# Patient Record
Sex: Female | Born: 1990 | Hispanic: Yes | Marital: Single | Smoking: Never smoker
Health system: Southern US, Community
[De-identification: ages and names within clinical notes are randomized; demographics above are authoritative.]

## PROBLEM LIST (undated history)

## (undated) ENCOUNTER — Inpatient Hospital Stay (HOSPITAL_COMMUNITY): Payer: Self-pay

## (undated) DIAGNOSIS — R102 Pelvic and perineal pain: Secondary | ICD-10-CM

## (undated) DIAGNOSIS — N83201 Unspecified ovarian cyst, right side: Secondary | ICD-10-CM

## (undated) DIAGNOSIS — J039 Acute tonsillitis, unspecified: Secondary | ICD-10-CM

## (undated) DIAGNOSIS — R42 Dizziness and giddiness: Secondary | ICD-10-CM

## (undated) DIAGNOSIS — R55 Syncope and collapse: Secondary | ICD-10-CM

## (undated) DIAGNOSIS — R519 Headache, unspecified: Secondary | ICD-10-CM

## (undated) HISTORY — DX: Syncope and collapse: R55

## (undated) HISTORY — PX: OTHER SURGICAL HISTORY: SHX169

## (undated) HISTORY — DX: Pelvic and perineal pain: R10.2

## (undated) HISTORY — DX: Acute tonsillitis, unspecified: J03.90

## (undated) HISTORY — DX: Dizziness and giddiness: R42

## (undated) HISTORY — DX: Headache, unspecified: R51.9

## (undated) HISTORY — DX: Unspecified ovarian cyst, right side: N83.201

---
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
## 2013-04-24 ENCOUNTER — Emergency Department (HOSPITAL_COMMUNITY)
Admission: EM | Admit: 2013-04-24 | Discharge: 2013-04-25 | Disposition: A | Discharge status: Home / Self Care | Payer: Self-pay | Attending: Emergency Medicine | Admitting: Emergency Medicine

## 2013-04-24 ENCOUNTER — Encounter (HOSPITAL_COMMUNITY): Payer: Self-pay | Admitting: Adult Health

## 2013-04-24 DIAGNOSIS — Z23 Encounter for immunization: Secondary | ICD-10-CM | POA: Insufficient documentation

## 2013-04-24 DIAGNOSIS — L089 Local infection of the skin and subcutaneous tissue, unspecified: Secondary | ICD-10-CM

## 2013-04-24 DIAGNOSIS — T22219A Burn of second degree of unspecified forearm, initial encounter: Secondary | ICD-10-CM | POA: Insufficient documentation

## 2013-04-24 DIAGNOSIS — X19XXXA Contact with other heat and hot substances, initial encounter: Secondary | ICD-10-CM | POA: Insufficient documentation

## 2013-04-24 DIAGNOSIS — Y9289 Other specified places as the place of occurrence of the external cause: Secondary | ICD-10-CM | POA: Insufficient documentation

## 2013-04-24 DIAGNOSIS — Y9389 Activity, other specified: Secondary | ICD-10-CM | POA: Insufficient documentation

## 2013-04-24 DIAGNOSIS — Y990 Civilian activity done for income or pay: Secondary | ICD-10-CM | POA: Insufficient documentation

## 2013-04-24 DIAGNOSIS — IMO0002 Reserved for concepts with insufficient information to code with codable children: Secondary | ICD-10-CM

## 2013-04-24 MED ORDER — SULFAMETHOXAZOLE-TRIMETHOPRIM 800-160 MG PO TABS: 1.0000 | ORAL_TABLET | Freq: Two times a day (BID) | ORAL | Status: DC

## 2013-04-24 MED ORDER — SILVER SULFADIAZINE 1 % EX CREA
TOPICAL_CREAM | Freq: Once | CUTANEOUS | Status: AC
  Administered 2013-04-25: via TOPICAL
  Filled 2013-04-24: qty 85

## 2013-04-24 MED ORDER — TETANUS-DIPHTH-ACELL PERTUSSIS 5-2.5-18.5 LF-MCG/0.5 IM SUSP
0.5000 mL | Freq: Once | INTRAMUSCULAR | Status: AC
  Administered 2013-04-25: 0.5 mL via INTRAMUSCULAR
  Filled 2013-04-24: qty 0.5

## 2013-04-24 NOTE — ED Notes (Signed)
Presents with left arm burn from an oven on Tuesday of this week. Wound is scabbed and healing. Pt c/o itchiness and pain.

## 2013-04-24 NOTE — ED Provider Notes (Signed)
History     CSN: [id number]  Arrival date & time 04/24/13  4696   First MD Initiated Contact with Patient 04/24/13 2301      Chief Complaint  Patient presents with  . Burn    (Consider location/radiation/quality/duration/timing/severity/associated sxs/prior treatment) HPI Comments: 22 y/o female presents to the ED with a burn to her left forarm after hitting it on a hot oven at work 5 days ago. Patient states the burn is painful and itchy, worse when she touches the area to 8/10. She has been applying topical antibiotic cream with mild relief, however states it is beginning to feel hot and look red. Denies numbness or tingling. No fever or chills.   Patient is a 22 y.o. female presenting with burn. The history is provided by the patient.  Burn    History reviewed. No pertinent past medical history.  History reviewed. No pertinent past surgical history.  History reviewed. No pertinent family history.  History  Substance Use Topics  . Smoking status: Never Smoker   . Smokeless tobacco: Not on file  . Alcohol Use: No    OB History   Grav Para Term Preterm Abortions TAB SAB Ect Mult Living                  Review of Systems  Constitutional: Negative for fever and chills.  Skin: Positive for color change and wound.  All other systems reviewed and are negative.    Allergies  Review of patient's allergies indicates no known allergies.  Home Medications   Current Outpatient Rx  Name  Route  Sig  Dispense  Refill  . sulfamethoxazole-trimethoprim (BACTRIM DS,SEPTRA DS) 800-160 MG per tablet   Oral   Take 1 tablet by mouth 2 (two) times daily. One po bid x 7 days   14 tablet   0     BP 141/82  Pulse 82  Temp(Src) 98.3 F (36.8 C) (Oral)  Resp 16  SpO2 100%  Physical Exam  Nursing note and vitals reviewed. Constitutional: She is oriented to person, place, and time. She appears well-developed and well-nourished. No distress.  HENT:  Head: Normocephalic and  atraumatic.  Mouth/Throat: Oropharynx is clear and moist.  Eyes: Conjunctivae and EOM are normal.  Neck: Normal range of motion. Neck supple.  Cardiovascular: Normal rate, regular rhythm, normal heart sounds and intact distal pulses.   Capillary refill < 3 seconds.  Pulmonary/Chest: Effort normal and breath sounds normal. No respiratory distress.  Musculoskeletal: Normal range of motion.       Left elbow: Normal.       Left wrist: Normal.  Neurological: She is alert and oriented to person, place, and time. No sensory deficit.  Intact 2 point discrimination.  Skin: Skin is warm. Burn noted.  2 inch diameter 2nd degree burn to left forearm with surrounding edema and erythema. Warm to touch. Tender to palpation.  Psychiatric: She has a normal mood and affect. Her behavior is normal.    ED Course  Procedures (including critical care time)  Labs Reviewed - No data to display No results found.   1. Second degree burn   2. Skin infection       MDM  22 y/o female with second degree burn with associated skin infection. Silvadene applied. Tdap given. Rx bactrim. Wound care given. Advised ibuprofen 800mg  along with icing. Return precautions discussed. Patient states understanding of plan and is agreeable.        Trevor Mace, PA-C 04/24/13  2354 

## 2013-04-25 NOTE — ED Notes (Signed)
Pt discharged.Vital signs stable and GCS 15 

## 2013-04-25 NOTE — ED Provider Notes (Signed)
Medical screening examination/treatment/procedure(s) were performed by non-physician practitioner and as supervising physician I was immediately available for consultation/collaboration.  John-Adam Javae Braaten, M.D.   John-Adam Finola Rosal, MD 04/25/13 0750 

## 2013-11-22 ENCOUNTER — Inpatient Hospital Stay (HOSPITAL_COMMUNITY)
Admission: AD | Admit: 2013-11-22 | Discharge: 2013-11-22 | Disposition: A | Discharge status: Home / Self Care | Payer: Medicaid Other | Source: Ambulatory Visit | Attending: Family Medicine | Admitting: Family Medicine

## 2013-11-22 ENCOUNTER — Encounter (HOSPITAL_COMMUNITY): Payer: Self-pay | Admitting: Medical

## 2013-11-22 DIAGNOSIS — Z3201 Encounter for pregnancy test, result positive: Secondary | ICD-10-CM

## 2013-11-22 DIAGNOSIS — O99891 Other specified diseases and conditions complicating pregnancy: Secondary | ICD-10-CM | POA: Insufficient documentation

## 2013-11-22 DIAGNOSIS — R109 Unspecified abdominal pain: Secondary | ICD-10-CM | POA: Insufficient documentation

## 2013-11-22 DIAGNOSIS — I959 Hypotension, unspecified: Secondary | ICD-10-CM | POA: Insufficient documentation

## 2013-11-22 DIAGNOSIS — O210 Mild hyperemesis gravidarum: Secondary | ICD-10-CM | POA: Insufficient documentation

## 2013-11-22 LAB — URINALYSIS, ROUTINE W REFLEX MICROSCOPIC
Bilirubin Urine: NEGATIVE
Ketones, ur: NEGATIVE mg/dL
Leukocytes, UA: NEGATIVE
Nitrite: NEGATIVE
Specific Gravity, Urine: 1.025 (ref 1.005–1.030)
Urobilinogen, UA: 0.2 mg/dL (ref 0.0–1.0)
pH: 6 (ref 5.0–8.0)

## 2013-11-22 MED ORDER — ONDANSETRON 8 MG PO TBDP
8.0000 mg | ORAL_TABLET | Freq: Once | ORAL | Status: AC
  Administered 2013-11-22: 8 mg via ORAL
  Filled 2013-11-22: qty 1

## 2013-11-22 MED ORDER — PROMETHAZINE HCL 25 MG PO TABS: 25.0000 mg | ORAL_TABLET | Freq: Four times a day (QID) | ORAL | Status: DC | PRN

## 2013-11-22 MED ORDER — ONDANSETRON 8 MG PO TBDP: 8.0000 mg | ORAL_TABLET | Freq: Three times a day (TID) | ORAL | Status: DC | PRN

## 2013-11-22 NOTE — MAU Note (Signed)
Patient states she had a positive pregnancy test at [facility] last week. States she has had pain 3 times in the past 2-3 weeks. Denies bleeding or vaginal discharge. Has had nausea and vomiting off and on for about 2-3 weeks.

## 2013-11-22 NOTE — MAU Provider Note (Signed)
History     CSN: [id number]  Arrival date and time: 11/22/13 1256   First Provider Initiated Contact with Patient 11/22/13 1353      Chief Complaint  Patient presents with  . Possible Pregnancy  . Abdominal Pain   HPI [name] is a 22 y.o. G1P0 at [redacted]w[redacted]d who presents to MAU today with multiple complaints. The patient states that she has had confirmation of pregnancy at the [facility] prior to today. The patient states that she plans to go to Dr. Gaynell Face for prenatal care. She states that she has had one episode of lower abdominal pain about 1 week ago. She has some right flank pain earlier today during ambulation that resolved after less than one minute. She patient not having any pain now. She denies vaginal bleeding or discharge. She states that she is also having occasional positional dizziness. She noted one episode of dizziness when she stood up the other day. The patient has also had occasional headaches, but no pain now. She denies fever or UTI symptoms.   OB History   Grav Para Term Preterm Abortions TAB SAB Ect Mult Living   1               No past medical history on file.  No past surgical history on file.  No family history on file.  History  Substance Use Topics  . Smoking status: Never Smoker   . Smokeless tobacco: Not on file  . Alcohol Use: No    Allergies: No Known Allergies  Prescriptions prior to admission  Medication Sig Dispense Refill  . sulfamethoxazole-trimethoprim (BACTRIM DS,SEPTRA DS) 800-160 MG per tablet Take 1 tablet by mouth 2 (two) times daily. One po bid x 7 days  14 tablet  0    Review of Systems  Constitutional: Negative for fever and malaise/fatigue.  Gastrointestinal: Positive for nausea and vomiting. Negative for abdominal pain, diarrhea and constipation.  Genitourinary: Negative for dysuria, urgency and frequency.       Neg - vaginal bleeding, discharge  Neurological: Positive for dizziness. Negative for loss  of consciousness.   Physical Exam   Blood pressure 137/71, pulse 111, temperature 98.6 F (37 C), temperature source Oral, resp. rate 16, height 5' 0.5" (1.537 m), weight 185 lb 9.6 oz (84.188 kg), last menstrual period 10/12/2013, SpO2 100.00%.  Physical Exam  Constitutional: She is oriented to person, place, and time. She appears well-developed and well-nourished. No distress.  HENT:  Head: Normocephalic and atraumatic.  Cardiovascular: Normal rate, regular rhythm and normal heart sounds.   Respiratory: Effort normal and breath sounds normal. No respiratory distress.  GI: Soft. Bowel sounds are normal. She exhibits no distension and no mass. There is no tenderness. There is no rebound and no guarding.  Neurological: She is alert and oriented to person, place, and time.  Skin: Skin is warm and dry. No erythema.  Psychiatric: She has a normal mood and affect.   Results for orders placed during the hospital encounter of 11/22/13 (from the past 24 hour(s))  URINALYSIS, ROUTINE W REFLEX MICROSCOPIC     Status: None   Collection Time    11/22/13  1:33 PM      Result Value Range   Color, Urine YELLOW  YELLOW   APPearance CLEAR  CLEAR   Specific Gravity, Urine 1.025  1.005 - 1.030   pH 6.0  5.0 - 8.0   Glucose, UA NEGATIVE  NEGATIVE mg/dL   Hgb urine dipstick NEGATIVE  NEGATIVE   Bilirubin Urine NEGATIVE  NEGATIVE   Ketones, ur NEGATIVE  NEGATIVE mg/dL   Protein, ur NEGATIVE  NEGATIVE mg/dL   Urobilinogen, UA 0.2  0.0 - 1.0 mg/dL   Nitrite NEGATIVE  NEGATIVE   Leukocytes, UA NEGATIVE  NEGATIVE  POCT PREGNANCY, URINE     Status: Abnormal   Collection Time    11/22/13  1:35 PM      Result Value Range   Preg Test, Ur POSITIVE (*) NEGATIVE    MAU Course  Procedures None  MDM +UPT UA today Spent ~ 20 minutes on patient education Discussed reasons for positional dizziness in pregnancy Discussed nausea and vomiting in pregnancy  Assessment and Plan  A: Positive pregnancy  test Positional hypotension in pregnancy Nausea and vomiting in pregnancy prior to [redacted] weeks gestation  P: Discharge home Patient advised to take Tylenol PRN for pain Rx for Phenergan and Zofran given/sent to patient's pharmacy First trimester warning signs reviewed Patient advised to make an appointment with Dr. Gaynell Face to start prenatal care Patient may return to MAU as needed or if her condition were to change or worsen  Freddi Starr, PA-C  11/22/2013, 1:54 PM

## 2013-11-22 NOTE — MAU Provider Note (Signed)
Chart reviewed and agree with management and plan.  

## 2013-11-22 NOTE — MAU Note (Signed)
Patient is in with c/o intermittent left lateral pain and nausea. She states that she called dr Gaynell Face and she was advised to come to MAU. She denies vaginal bleeding or abnormal discharge. lmp 10/15

## 2013-11-23 ENCOUNTER — Inpatient Hospital Stay (HOSPITAL_COMMUNITY): Payer: Medicaid Other

## 2013-11-23 ENCOUNTER — Inpatient Hospital Stay (HOSPITAL_COMMUNITY)
Admission: AD | Admit: 2013-11-23 | Discharge: 2013-11-23 | Disposition: A | Discharge status: Home / Self Care | Payer: Medicaid Other | Source: Ambulatory Visit | Attending: Obstetrics & Gynecology | Admitting: Obstetrics & Gynecology

## 2013-11-23 ENCOUNTER — Encounter (HOSPITAL_COMMUNITY): Payer: Self-pay | Admitting: *Deleted

## 2013-11-23 DIAGNOSIS — B9689 Other specified bacterial agents as the cause of diseases classified elsewhere: Secondary | ICD-10-CM | POA: Insufficient documentation

## 2013-11-23 DIAGNOSIS — N760 Acute vaginitis: Secondary | ICD-10-CM | POA: Insufficient documentation

## 2013-11-23 DIAGNOSIS — O208 Other hemorrhage in early pregnancy: Secondary | ICD-10-CM | POA: Insufficient documentation

## 2013-11-23 DIAGNOSIS — O209 Hemorrhage in early pregnancy, unspecified: Secondary | ICD-10-CM

## 2013-11-23 DIAGNOSIS — A499 Bacterial infection, unspecified: Secondary | ICD-10-CM | POA: Insufficient documentation

## 2013-11-23 DIAGNOSIS — R109 Unspecified abdominal pain: Secondary | ICD-10-CM | POA: Insufficient documentation

## 2013-11-23 DIAGNOSIS — O2390 Unspecified genitourinary tract infection in pregnancy, unspecified trimester: Secondary | ICD-10-CM | POA: Insufficient documentation

## 2013-11-23 DIAGNOSIS — O4690 Antepartum hemorrhage, unspecified, unspecified trimester: Secondary | ICD-10-CM

## 2013-11-23 LAB — CBC
HCT: 36.1 % (ref 36.0–46.0)
Hemoglobin: 12.8 g/dL (ref 12.0–15.0)
MCHC: 35.5 g/dL (ref 30.0–36.0)
RBC: 3.97 MIL/uL (ref 3.87–5.11)

## 2013-11-23 LAB — HCG, QUANTITATIVE, PREGNANCY: hCG, Beta Chain, Quant, S: 11406 m[IU]/mL — ABNORMAL HIGH (ref <5)

## 2013-11-23 LAB — WET PREP, GENITAL

## 2013-11-23 LAB — ABO/RH: ABO/RH(D): O POS

## 2013-11-23 MED ORDER — METRONIDAZOLE 500 MG PO TABS: 500.0000 mg | ORAL_TABLET | Freq: Two times a day (BID) | ORAL | Status: DC

## 2013-11-23 NOTE — MAU Note (Signed)
Was seen in MAU yesterday for pain. States today she has no pain but started bleeding around 1330. At first was pink when she wiped. States now it is red and comes out some into toilet, but not onto panties.

## 2013-11-23 NOTE — MAU Provider Note (Signed)
History     CSN: [id number]  Arrival date and time: 11/23/13 1412   First Provider Initiated Contact with Patient 11/23/13 1535      Chief Complaint  Patient presents with  . Vaginal Bleeding   HPI  Pt is a 22 yo G1P0 at [redacted]w[redacted]d wks IUP here with report of bleeding that started around 1330. Pt reports it initially started light pink when she wiped, states now it is red and comes out some into toilet, but not onto panties.  Also reports having some cramping.     History reviewed. No pertinent past medical history.  History reviewed. No pertinent past surgical history.  History reviewed. No pertinent family history.  History  Substance Use Topics  . Smoking status: Never Smoker   . Smokeless tobacco: Not on file  . Alcohol Use: No    Allergies: No Known Allergies  Prescriptions prior to admission  Medication Sig Dispense Refill  . ondansetron (ZOFRAN-ODT) 8 MG disintegrating tablet Take 1 tablet (8 mg total) by mouth every 8 (eight) hours as needed for nausea or vomiting.  20 tablet  0  . Prenatal Vit-Fe Fumarate-FA (PRENATAL MULTIVITAMIN) TABS tablet Take 1 tablet by mouth daily at 12 noon.      . promethazine (PHENERGAN) 25 MG tablet Take 1 tablet (25 mg total) by mouth every 6 (six) hours as needed for nausea or vomiting.  30 tablet  0    Review of Systems  Gastrointestinal: Positive for abdominal pain (cramping).  Genitourinary:       Vaginal bleeding  All other systems reviewed and are negative.   Physical Exam   Blood pressure 129/70, pulse 101, temperature 98.2 F (36.8 C), temperature source Oral, resp. rate 16, height 5' 0.5" (1.537 m), weight 84.46 kg (186 lb 3.2 oz), last menstrual period 10/12/2013.  Physical Exam  Constitutional: She is oriented to person, place, and time. She appears well-developed and well-nourished. No distress.  HENT:  Head: Normocephalic.  Neck: Normal range of motion. Neck supple.  Cardiovascular: Normal rate, regular rhythm  and normal heart sounds.   Respiratory: Effort normal and breath sounds normal. No respiratory distress.  GI: Soft. There is no tenderness.  Genitourinary: Uterus is enlarged. Right adnexum displays no mass, no tenderness and no fullness. Left adnexum displays no mass, no tenderness and no fullness. There is bleeding (moderate, neg clots) around the vagina.  Cervix visually closed  Musculoskeletal: Normal range of motion.  Neurological: She is alert and oriented to person, place, and time.  Skin: Skin is warm and dry.    MAU Course  Procedures  Results for orders placed during the hospital encounter of 11/23/13 (from the past 24 hour(s))  WET PREP, GENITAL     Status: Abnormal   Collection Time    11/23/13  3:34 PM      Result Value Range   Yeast Wet Prep HPF POC NONE SEEN  NONE SEEN   Trich, Wet Prep NONE SEEN  NONE SEEN   Clue Cells Wet Prep HPF POC MODERATE (*) NONE SEEN   WBC, Wet Prep HPF POC FEW (*) NONE SEEN  CBC     Status: None   Collection Time    11/23/13  3:48 PM      Result Value Range   WBC 10.2  4.0 - 10.5 K/uL   RBC 3.97  3.87 - 5.11 MIL/uL   Hemoglobin 12.8  12.0 - 15.0 g/dL   HCT 28.4  13.2 - 44.0 %  MCV 90.9  78.0 - 100.0 fL   MCH 32.2  26.0 - 34.0 pg   MCHC 35.5  30.0 - 36.0 g/dL   RDW 16.1  09.6 - 04.5 %   Platelets 242  150 - 400 K/uL  HCG, QUANTITATIVE, PREGNANCY     Status: Abnormal   Collection Time    11/23/13  3:48 PM      Result Value Range   hCG, Beta Chain, Quant, S 11406 (*) <5 mIU/mL  ABO/RH     Status: None   Collection Time    11/23/13  3:48 PM      Result Value Range   ABO/RH(D) O POS       Ultrasound: FINDINGS:  Intrauterine gestational sac: Visualized/normal in shape.  Yolk sac: Present.  Embryo: Present.  Cardiac Activity: Present.  Heart Rate: 137 bpm  CRL: 2.8 mm 6 w 0 d [place] EDC: 07/19/2014  Maternal uterus/adnexae: Moderate subchorionic hemorrhage. Right  ovary is not visualized. A corpus luteum cyst is seen in the left   ovary. No free fluid.  IMPRESSION:  1. Single living intrauterine pregnancy with a gestational age of [redacted]  weeks 0 days and estimated date of confinement of 07/19/2014.  2. Moderate subchorionic hemorrhage.   Assessment and Plan  22 yo G1P0 at [redacted]w[redacted]d weeks IUP Subchorionic Hemorrhage Bacterial Vaginosis  Plan: Discharge home Bleeding precautions  RX Flagyl 500 mg BID x 7 days   [facility] 11/23/2013, 3:36 PM

## 2013-11-24 LAB — GC/CHLAMYDIA PROBE AMP: GC Probe RNA: NEGATIVE

## 2013-11-30 NOTE — MAU Provider Note (Signed)
Attestation of Attending Supervision of Advanced Practitioner (CNM/NP): Evaluation and management procedures were performed by the Advanced Practitioner under my supervision and collaboration.  I have reviewed the Advanced Practitioner's note and chart, and I agree with the management and plan.  HARRAWAY-SMITH, Ajai Harville 2:38 PM     

## 2013-12-01 ENCOUNTER — Inpatient Hospital Stay (HOSPITAL_COMMUNITY)
Admission: AD | Admit: 2013-12-01 | Discharge: 2013-12-02 | Disposition: A | Discharge status: Home / Self Care | Payer: Medicaid Other | Source: Ambulatory Visit | Attending: Obstetrics and Gynecology | Admitting: Obstetrics and Gynecology

## 2013-12-01 DIAGNOSIS — R109 Unspecified abdominal pain: Secondary | ICD-10-CM | POA: Insufficient documentation

## 2013-12-01 DIAGNOSIS — O418X10 Other specified disorders of amniotic fluid and membranes, first trimester, not applicable or unspecified: Secondary | ICD-10-CM

## 2013-12-01 DIAGNOSIS — O208 Other hemorrhage in early pregnancy: Secondary | ICD-10-CM | POA: Insufficient documentation

## 2013-12-02 ENCOUNTER — Encounter (HOSPITAL_COMMUNITY): Payer: Self-pay | Admitting: *Deleted

## 2013-12-02 ENCOUNTER — Inpatient Hospital Stay (HOSPITAL_COMMUNITY): Payer: Medicaid Other

## 2013-12-02 DIAGNOSIS — O468X9 Other antepartum hemorrhage, unspecified trimester: Secondary | ICD-10-CM

## 2013-12-02 LAB — URINALYSIS, ROUTINE W REFLEX MICROSCOPIC
Leukocytes, UA: NEGATIVE
Nitrite: NEGATIVE
Protein, ur: NEGATIVE mg/dL
Specific Gravity, Urine: 1.015 (ref 1.005–1.030)
Urobilinogen, UA: 0.2 mg/dL (ref 0.0–1.0)

## 2013-12-02 LAB — POCT PREGNANCY, URINE: Preg Test, Ur: POSITIVE — AB

## 2013-12-02 LAB — URINE MICROSCOPIC-ADD ON

## 2013-12-02 NOTE — MAU Note (Signed)
Pt states she started having pain 12/01/13 at 2200 that lasted for about 1-22min. Pt states she does not have the pain any more

## 2013-12-02 NOTE — MAU Provider Note (Signed)
Chief Complaint: No chief complaint on file.   First Provider Initiated Contact with Patient 12/02/13 0215      SUBJECTIVE HPI: Tina [name] is a 22 y.o. G1P0 at [redacted]w[redacted]d by LMP who presents with dark red and pink vaginal bleeding and upper abd pain x 15 minutes that resolved spontaneously. Live IUP confirmed 11/23/2013. Known SCH.   Past Medical History  Diagnosis Date  . Medical history non-contributory    OB History  Gravida Para Term Preterm AB SAB TAB Ectopic Multiple Living  1         0    # Outcome Date GA Lbr Len/2nd Weight Sex Delivery Anes PTL Lv  1 CUR              Past Surgical History  Procedure Laterality Date  . No past surgeries     History   Social History  . Marital Status: Single    Spouse Name: N/A    Number of Children: N/A  . Years of Education: N/A   Occupational History  . Not on file.   Social History Main Topics  . Smoking status: Never Smoker   . Smokeless tobacco: Not on file  . Alcohol Use: No  . Drug Use: No  . Sexual Activity: No   Other Topics Concern  . Not on file   Social History Narrative  . No narrative on file   No current facility-administered medications on file prior to encounter.   Current Outpatient Prescriptions on File Prior to Encounter  Medication Sig Dispense Refill  . metroNIDAZOLE (FLAGYL) 500 MG tablet Take 1 tablet (500 mg total) by mouth 2 (two) times daily.  14 tablet  0  . ondansetron (ZOFRAN-ODT) 8 MG disintegrating tablet Take 1 tablet (8 mg total) by mouth every 8 (eight) hours as needed for nausea or vomiting.  20 tablet  0  . Prenatal Vit-Fe Fumarate-FA (PRENATAL MULTIVITAMIN) TABS tablet Take 1 tablet by mouth daily at 12 noon.      . promethazine (PHENERGAN) 25 MG tablet Take 1 tablet (25 mg total) by mouth every 6 (six) hours as needed for nausea or vomiting.  30 tablet  0   No Known Allergies  ROS: Pertinent items in HPI  OBJECTIVE Blood pressure 121/77, pulse 87, temperature 98 F (36.7 C),  temperature source Oral, resp. rate 16, height 5\' 1"  (1.549 m), weight 87.454 kg (192 lb 12.8 oz), last menstrual period 10/12/2013. GENERAL: Well-developed, well-nourished female in no acute distress.  HEENT: Normocephalic HEART: normal rate RESP: normal effort ABDOMEN: Soft, non-tender EXTREMITIES: Nontender, no edema NEURO: Alert and oriented SPECULUM EXAM: Deferred due to recent exam. Scant blood on pad.   LAB RESULTS Results for orders placed during the hospital encounter of 12/01/13 (from the past 24 hour(s))  URINALYSIS, ROUTINE W REFLEX MICROSCOPIC     Status: Abnormal   Collection Time    12/01/13 11:56 PM      Result Value Range   Color, Urine YELLOW  YELLOW   APPearance CLEAR  CLEAR   Specific Gravity, Urine 1.015  1.005 - 1.030   pH 6.0  5.0 - 8.0   Glucose, UA NEGATIVE  NEGATIVE mg/dL   Hgb urine dipstick LARGE (*) NEGATIVE   Bilirubin Urine NEGATIVE  NEGATIVE   Ketones, ur NEGATIVE  NEGATIVE mg/dL   Protein, ur NEGATIVE  NEGATIVE mg/dL   Urobilinogen, UA 0.2  0.0 - 1.0 mg/dL   Nitrite NEGATIVE  NEGATIVE   Leukocytes, UA NEGATIVE  NEGATIVE  URINE MICROSCOPIC-ADD ON     Status: None   Collection Time    12/01/13 11:56 PM      Result Value Range   Squamous Epithelial / LPF RARE  RARE   WBC, UA 0-2  <3 WBC/hpf   RBC / HPF 0-2  <3 RBC/hpf   Bacteria, UA RARE  RARE  POCT PREGNANCY, URINE     Status: Abnormal   Collection Time    12/02/13 12:15 AM      Result Value Range   Preg Test, Ur POSITIVE (*) NEGATIVE    IMAGING US Ob Transvaginal  12/02/2013   CLINICAL DATA:  Bleeding  EXAM: TRANSVAGINAL OB ULTRASOUND  TECHNIQUE: Transvaginal ultrasound was performed for complete evaluation of the gestation as well as the maternal uterus, adnexal regions, and pelvic cul-de-sac.  COMPARISON:  11/23/2013  FINDINGS: Intrauterine gestational sac: Visualized/normal in shape.  Yolk sac:  Identified  Embryo:  Identified  Cardiac Activity: Identified  Heart Rate: 154 bpm  CRL:   11   mm   7 w 2d                  [place] EDC: 07/19/2014  Maternal uterus/adnexae: Right ovary not visualized. Normal sonographic appearance to the left ovary. No free fluid.  Large subchorionic hemorrhage, increased in size in the interval, now measuring 3.1 x 2.6 x 4.0 cm.  IMPRESSION: Single intrauterine gestation with cardiac activity documented. Estimated age of 7 weeks 2 days by crown-rump length.  Large subchorionic hemorrhage, increased in size in the interval.   Electronically Signed   By: Jearld Lesch M.D.   On: 12/02/2013 03:13   MAU COURSE  ASSESSMENT 1. Subchorionic hemorrhage in first trimester    PLAN Discharge home in stable condition. Pelvic rest x 1 week.  Bleeding precautions.     Follow-up Information   Follow up with [facility] On 12/13/2013. (as scheduled or as needed if symptoms worsen)    Contact information:   [address] [place] [address] [phone]      Follow up with [facility] [place] [facility]ERN[facility] ADMISS[facility]. (As needed if symptoms worsen)    Contact information:   [address] [id number] [place] [address] [phone]       Medication List         metroNIDAZOLE 500 MG tablet  Commonly known as:  FLAGYL  Take 1 tablet (500 mg total) by mouth 2 (two) times daily.     ondansetron 8 MG disintegrating tablet  Commonly known as:  ZOFRAN-ODT  Take 1 tablet (8 mg total) by mouth every 8 (eight) hours as needed for nausea or vomiting.     prenatal multivitamin Tabs tablet  Take 1 tablet by mouth daily at 12 noon.     promethazine 25 MG tablet  Commonly known as:  PHENERGAN  Take 1 tablet (25 mg total) by mouth every 6 (six) hours as needed for nausea or vomiting.       [place], [place] 12/02/2013  4:28 AM   '

## 2013-12-02 NOTE — MAU Note (Signed)
Pt G1 at 7.2wks with RUQ pain and vag bleeding.

## 2013-12-02 NOTE — MAU Provider Note (Signed)
Attestation of Attending Supervision of Advanced Practitioner (CNM/NP): Evaluation and management procedures were performed by the Advanced Practitioner under my supervision and collaboration.  I have reviewed the Advanced Practitioner's note and chart, and I agree with the management and plan.  Drako Maese 12/02/2013 8:12 AM   

## 2013-12-06 ENCOUNTER — Ambulatory Visit (INDEPENDENT_AMBULATORY_CARE_PROVIDER_SITE_OTHER): Payer: Medicaid Other | Admitting: Obstetrics

## 2013-12-06 VITALS — BP 119/80 | HR 79 | Temp 97.3°F | Ht 61.0 in | Wt 191.0 lb

## 2013-12-06 DIAGNOSIS — Z3201 Encounter for pregnancy test, result positive: Secondary | ICD-10-CM

## 2013-12-06 NOTE — Progress Notes (Signed)
Subjective:     Tina [name] is a 22 y.o. female here for a new patient exam.  Current complaints: pt in office today to f/u from visit to MAU. Pt has subchorionic hemorrhage.   Pt has had light brown bleeding since seen in MAU.  Personal health questionnaire reviewed: yes. Pt not seen by physician today. Pt to schedule NOB appt.  Gynecologic History Patient's last menstrual period was 10/12/2013. Contraception: none Last Pap: unsure. Results were: normal   Obstetric History OB History  Gravida Para Term Preterm AB SAB TAB Ectopic Multiple Living  1         0    # Outcome Date GA Lbr Len/2nd Weight Sex Delivery Anes PTL Lv  1 CUR                The following portions of the patient's history were reviewed and updated as appropriate: allergies, current medications, past family history, past medical history, past social history, past surgical history and problem list.  Review of Systems Pertinent items are noted in HPI.    Objective:    No exam performed today, patient left without being seen.    Assessment:    Pregnancy.  Patient not seen by physician.  Rescheduled.   Plan:    Patient to schedule NOB visit.

## 2013-12-09 ENCOUNTER — Encounter: Payer: Self-pay | Admitting: Obstetrics

## 2013-12-13 ENCOUNTER — Encounter: Payer: Self-pay | Admitting: Advanced Practice Midwife

## 2013-12-18 ENCOUNTER — Encounter (HOSPITAL_COMMUNITY): Payer: Self-pay | Admitting: *Deleted

## 2013-12-18 ENCOUNTER — Inpatient Hospital Stay (HOSPITAL_COMMUNITY)
Admission: AD | Admit: 2013-12-18 | Discharge: 2013-12-18 | Disposition: A | Discharge status: Home / Self Care | Payer: Medicaid Other | Source: Ambulatory Visit | Attending: Obstetrics | Admitting: Obstetrics

## 2013-12-18 DIAGNOSIS — O368990 Maternal care for other specified fetal problems, unspecified trimester, not applicable or unspecified: Secondary | ICD-10-CM | POA: Insufficient documentation

## 2013-12-18 DIAGNOSIS — O418X10 Other specified disorders of amniotic fluid and membranes, first trimester, not applicable or unspecified: Secondary | ICD-10-CM

## 2013-12-18 LAB — URINALYSIS, ROUTINE W REFLEX MICROSCOPIC
Bilirubin Urine: NEGATIVE
Glucose, UA: NEGATIVE mg/dL
Protein, ur: NEGATIVE mg/dL

## 2013-12-18 LAB — URINE MICROSCOPIC-ADD ON

## 2013-12-18 LAB — CBC
HCT: 36.1 % (ref 36.0–46.0)
MCH: 32 pg (ref 26.0–34.0)
MCHC: 34.6 g/dL (ref 30.0–36.0)
MCV: 92.3 fL (ref 78.0–100.0)
Platelets: 256 10*3/uL (ref 150–400)
RDW: 13.6 % (ref 11.5–15.5)
WBC: 11.8 10*3/uL — ABNORMAL HIGH (ref 4.0–10.5)

## 2013-12-18 NOTE — MAU Note (Signed)
C/o vaginal bleeding that started about a hour ago ; states that she feel one sharp pain and then the bleeding started; denies cramping now;

## 2013-12-18 NOTE — MAU Provider Note (Signed)
History    CSN: [id number]  Arrival date and time: 12/18/13 1656   First Provider Initiated Contact with Patient 12/18/13 1800      Chief Complaint  Patient presents with  . Vaginal Bleeding   HPI  Tina [name] is a 22 y.o. G1P0 at [redacted]w[redacted]d who has a know [facility]. She was seen in the office for a new OB appointment, and was told to come to MAU with any bleeding. Today she had one episode of pink/brown blood seen when she voided and then wiped. She has not seen any since. She denies any pain at this time.   OB History   Grav Para Term Preterm Abortions TAB SAB Ect Mult Living   1         0      Past Medical History  Diagnosis Date  . Medical history non-contributory     Past Surgical History  Procedure Laterality Date  . No past surgeries      Family History  Problem Relation Age of Onset  . Alcohol abuse Neg Hx   . Arthritis Neg Hx   . Asthma Neg Hx   . Birth defects Neg Hx   . Cancer Neg Hx   . COPD Neg Hx   . Depression Neg Hx   . Diabetes Neg Hx   . Drug abuse Neg Hx   . Early death Neg Hx   . Hearing loss Neg Hx   . Heart disease Neg Hx   . Hyperlipidemia Neg Hx   . Hypertension Neg Hx   . Kidney disease Neg Hx   . Learning disabilities Neg Hx   . Mental illness Neg Hx   . Mental retardation Neg Hx   . Miscarriages / Stillbirths Neg Hx   . Stroke Neg Hx   . Vision loss Neg Hx   . Varicose Veins Neg Hx     History  Substance Use Topics  . Smoking status: Never Smoker   . Smokeless tobacco: Not on file  . Alcohol Use: No    Allergies: No Known Allergies  Prescriptions prior to admission  Medication Sig Dispense Refill  . metroNIDAZOLE (FLAGYL) 500 MG tablet Take 1 tablet (500 mg total) by mouth 2 (two) times daily.  14 tablet  0  . Prenatal Vit-Fe Fumarate-FA (PRENATAL MULTIVITAMIN) TABS tablet Take 1 tablet by mouth daily at 12 noon.        ROS Physical Exam   Blood pressure 126/65, pulse 86, temperature 97.7 F (36.5 C), temperature source  Oral, resp. rate 16, height 5\' 1"  (1.549 m), weight 86.637 kg (191 lb), last menstrual period 10/12/2013.  Physical Exam  Constitutional: She is oriented to person, place, and time. She appears well-developed and well-nourished. No distress.  HENT:  Head: Normocephalic.  Eyes: Pupils are equal, round, and reactive to light.  Neck: Neck supple.  GI: Soft. She exhibits no distension. There is no tenderness. There is no rebound and no guarding.  Genitourinary:  Speculum exam: Vagina - Small amount of thick, brown discharge, no odor Cervix - No contact bleeding, no active bleeding  Bimanual exam: Cervix closed, no CMT  Uterus non tender, normal size, enlarged  Adnexa non tender, no masses bilaterally Chaperone present for exam.   Neurological: She is alert and oriented to person, place, and time.  Skin: Skin is warm. She is not diaphoretic.    MAU Course  Procedures  MDM  Results for orders placed during the hospital encounter of 12/18/13 (  from the past 24 hour(s))  CBC     Status: Abnormal   Collection Time    12/18/13  5:04 PM      Result Value Range   WBC 11.8 (*) 4.0 - 10.5 K/uL   RBC 3.91  3.87 - 5.11 MIL/uL   Hemoglobin 12.5  12.0 - 15.0 g/dL   HCT 16.1  09.6 - 04.5 %   MCV 92.3  78.0 - 100.0 fL   MCH 32.0  26.0 - 34.0 pg   MCHC 34.6  30.0 - 36.0 g/dL   RDW 40.9  81.1 - 91.4 %   Platelets 256  150 - 400 K/uL  HCG, QUANTITATIVE, PREGNANCY     Status: Abnormal   Collection Time    12/18/13  5:04 PM      Result Value Range   hCG, Beta Chain, Quant, [place] [address] (*) <5 mIU/mL  URINALYSIS, ROUTINE W REFLEX MICROSCOPIC     Status: Abnormal   Collection Time    12/18/13  5:08 PM      Result Value Range   Color, Urine YELLOW  YELLOW   APPearance CLEAR  CLEAR   Specific Gravity, Urine <1.005 (*) 1.005 - 1.030   pH 5.5  5.0 - 8.0   Glucose, UA NEGATIVE  NEGATIVE mg/dL   Hgb urine dipstick LARGE (*) NEGATIVE   Bilirubin Urine NEGATIVE  NEGATIVE   Ketones, ur NEGATIVE   NEGATIVE mg/dL   Protein, ur NEGATIVE  NEGATIVE mg/dL   Urobilinogen, UA 0.2  0.0 - 1.0 mg/dL   Nitrite NEGATIVE  NEGATIVE   Leukocytes, UA NEGATIVE  NEGATIVE  URINE MICROSCOPIC-ADD ON     Status: Abnormal   Collection Time    12/18/13  5:08 PM      Result Value Range   Squamous Epithelial / LPF FEW (*) RARE   WBC, UA 0-2  <3 WBC/hpf   RBC / HPF 3-6  <3 RBC/hpf   Bacteria, UA RARE  RARE    Assessment and Plan   1. Subchorionic hematoma in first trimester    Bleeding precautions reviewed FU with the office as needed Return to MAU as needed   [facility], JENNIFER IRENE 12/18/2013, 6:13 PM

## 2013-12-18 NOTE — MAU Note (Signed)
Has hx of subchorionic hemorrhage;

## 2013-12-25 ENCOUNTER — Encounter (HOSPITAL_COMMUNITY): Payer: Self-pay | Admitting: *Deleted

## 2013-12-25 ENCOUNTER — Inpatient Hospital Stay (HOSPITAL_COMMUNITY)
Admission: AD | Admit: 2013-12-25 | Discharge: 2013-12-25 | Disposition: A | Discharge status: Home / Self Care | Payer: Medicaid Other | Source: Ambulatory Visit | Attending: Obstetrics and Gynecology | Admitting: Obstetrics and Gynecology

## 2013-12-25 DIAGNOSIS — O468X9 Other antepartum hemorrhage, unspecified trimester: Secondary | ICD-10-CM

## 2013-12-25 DIAGNOSIS — O418X10 Other specified disorders of amniotic fluid and membranes, first trimester, not applicable or unspecified: Secondary | ICD-10-CM

## 2013-12-25 DIAGNOSIS — M549 Dorsalgia, unspecified: Secondary | ICD-10-CM | POA: Insufficient documentation

## 2013-12-25 DIAGNOSIS — O210 Mild hyperemesis gravidarum: Secondary | ICD-10-CM | POA: Insufficient documentation

## 2013-12-25 DIAGNOSIS — O208 Other hemorrhage in early pregnancy: Secondary | ICD-10-CM | POA: Insufficient documentation

## 2013-12-25 DIAGNOSIS — R109 Unspecified abdominal pain: Secondary | ICD-10-CM | POA: Insufficient documentation

## 2013-12-25 LAB — URINALYSIS, ROUTINE W REFLEX MICROSCOPIC
Bilirubin Urine: NEGATIVE
Glucose, UA: NEGATIVE mg/dL
Nitrite: NEGATIVE
Specific Gravity, Urine: <1.005 — ABNORMAL LOW (ref 1.005–1.030)
Urobilinogen, UA: 0.2 mg/dL (ref 0.0–1.0)
pH: 6.5 (ref 5.0–8.0)

## 2013-12-25 LAB — URINE MICROSCOPIC-ADD ON

## 2013-12-25 NOTE — MAU Note (Signed)
Pt reports vaginal bleeding since 6 pm, right side pain, lower back pain

## 2013-12-25 NOTE — MAU Provider Note (Signed)
History     CSN: [id number]  Arrival date and time: 12/25/13 2120   First Provider Initiated Contact with Patient 12/25/13 2217      Chief Complaint  Patient presents with  . Back Pain  . Vaginal Bleeding   HPI [name] is a 22 y.o. G1P0 at [redacted]w[redacted]d who presents to MAU today with complaint of vaginal bleeding. The patient has a know large [facility] seen on [place] 12/02/13. She states that tonight she noted heavier bleeding than recently with some small clots. She also started to have some mild suprapubic pain that she rates at 5/10 at the worst. She has had some N/V today, without decrease in appetite. She denies fever. She does endorse some weakness without LOC.   OB History   Grav Para Term Preterm Abortions TAB SAB Ect Mult Living   1         0      Past Medical History  Diagnosis Date  . Medical history non-contributory     Past Surgical History  Procedure Laterality Date  . No past surgeries      Family History  Problem Relation Age of Onset  . Alcohol abuse Neg Hx   . Arthritis Neg Hx   . Asthma Neg Hx   . Birth defects Neg Hx   . Cancer Neg Hx   . COPD Neg Hx   . Depression Neg Hx   . Diabetes Neg Hx   . Drug abuse Neg Hx   . Early death Neg Hx   . Hearing loss Neg Hx   . Heart disease Neg Hx   . Hyperlipidemia Neg Hx   . Hypertension Neg Hx   . Kidney disease Neg Hx   . Learning disabilities Neg Hx   . Mental illness Neg Hx   . Mental retardation Neg Hx   . Miscarriages / Stillbirths Neg Hx   . Stroke Neg Hx   . Vision loss Neg Hx   . Varicose Veins Neg Hx     History  Substance Use Topics  . Smoking status: Never Smoker   . Smokeless tobacco: Not on file  . Alcohol Use: No    Allergies: No Known Allergies  Prescriptions prior to admission  Medication Sig Dispense Refill  . Prenatal Vit-Fe Fumarate-FA (PRENATAL MULTIVITAMIN) TABS tablet Take 1 tablet by mouth daily at 12 noon.        Review of Systems  Constitutional: Negative for fever  and malaise/fatigue.  Gastrointestinal: Positive for nausea, vomiting and abdominal pain. Negative for diarrhea and constipation.  Genitourinary: Negative for dysuria, urgency and frequency.       + vaginal bleeding  Neurological: Positive for dizziness and weakness. Negative for loss of consciousness.   Physical Exam   Blood pressure 124/77, pulse 87, temperature 98.6 F (37 C), temperature source Oral, resp. rate 18, height 5\' 1"  (1.549 m), weight 200 lb (90.719 kg), last menstrual period 10/12/2013, SpO2 100.00%.  Physical Exam  Constitutional: She is oriented to person, place, and time. She appears well-developed and well-nourished. No distress.  HENT:  Head: Normocephalic and atraumatic.  Cardiovascular: Normal rate, regular rhythm and normal heart sounds.   Respiratory: Effort normal and breath sounds normal. No respiratory distress.  GI: Soft. Bowel sounds are normal. She exhibits no distension and no mass. There is tenderness (moderate tenderness to palpation of the suprapubic region at midline). There is no rebound and no guarding.  Genitourinary: Uterus is enlarged (appropriate for GA) and  tender (mild). Cervix exhibits no motion tenderness, no discharge and no friability. There is bleeding (scant blood noted in the vaginal vault) around the vagina. No vaginal discharge found.  Neurological: She is alert and oriented to person, place, and time.  Skin: Skin is warm and dry. No erythema.  Psychiatric: She has a normal mood and affect.  Cervix: closed, long, thick and posterior  Results for orders placed during the hospital encounter of 12/25/13 (from the past 24 hour(s))  URINALYSIS, ROUTINE W REFLEX MICROSCOPIC     Status: Abnormal   Collection Time    12/25/13  9:40 PM      Result Value Range   Color, Urine ORANGE (*) YELLOW   APPearance CLEAR  CLEAR   Specific Gravity, Urine <1.005 (*) 1.005 - 1.030   pH 6.5  5.0 - 8.0   Glucose, UA NEGATIVE  NEGATIVE mg/dL   Hgb urine  dipstick LARGE (*) NEGATIVE   Bilirubin Urine NEGATIVE  NEGATIVE   Ketones, ur NEGATIVE  NEGATIVE mg/dL   Protein, ur NEGATIVE  NEGATIVE mg/dL   Urobilinogen, UA 0.2  0.0 - 1.0 mg/dL   Nitrite NEGATIVE  NEGATIVE   Leukocytes, UA NEGATIVE  NEGATIVE  URINE MICROSCOPIC-ADD ON     Status: Abnormal   Collection Time    12/25/13  9:40 PM      Result Value Range   Squamous Epithelial / LPF RARE  RARE   WBC, UA 0-2  <3 WBC/hpf   RBC / HPF 7-10  <3 RBC/hpf   Bacteria, UA FEW (*) RARE    MAU Course  Procedures None  MDM +FHT - 180 bpm with doppler VSS. Cervix is closed. Very little blood on exam. No POC in the vagina.   Assessment and Plan  A: Large subchorionic hemorrhage Vaginal bleeding in pregnancy Abdominal pain in pregnancy  P: Discharge home Bleeding precautions discussed Pelvic rest advised Follow-up with Dr. Clearance Coots to start prenatal care as scheduled this week Patient may return to MAU as needed or if her condition were to change or worsen  Freddi Starr, PA-C  12/25/2013, 10:17 PM

## 2013-12-26 NOTE — MAU Provider Note (Signed)
Attestation of Attending Supervision of Advanced Practitioner (CNM/NP): Evaluation and management procedures were performed by the Advanced Practitioner under my supervision and collaboration.  I have reviewed the Advanced Practitioner's note and chart, and I agree with the management and plan.  Camden Mazzaferro 12/26/2013 11:23 AM   

## 2013-12-27 ENCOUNTER — Ambulatory Visit (INDEPENDENT_AMBULATORY_CARE_PROVIDER_SITE_OTHER): Payer: Medicaid Other | Admitting: Advanced Practice Midwife

## 2013-12-27 ENCOUNTER — Encounter: Payer: Self-pay | Admitting: Advanced Practice Midwife

## 2013-12-27 VITALS — BP 130/83 | Temp 98.0°F | Wt 195.0 lb

## 2013-12-27 DIAGNOSIS — Z3400 Encounter for supervision of normal first pregnancy, unspecified trimester: Secondary | ICD-10-CM

## 2013-12-27 DIAGNOSIS — Z3201 Encounter for pregnancy test, result positive: Secondary | ICD-10-CM

## 2013-12-27 DIAGNOSIS — Z3401 Encounter for supervision of normal first pregnancy, first trimester: Secondary | ICD-10-CM

## 2013-12-27 LAB — POCT URINALYSIS DIPSTICK
Bilirubin, UA: NEGATIVE
Leukocytes, UA: NEGATIVE
Nitrite, UA: NEGATIVE
Spec Grav, UA: 1.015
Urobilinogen, UA: NEGATIVE

## 2013-12-27 LAB — HIV ANTIBODY (ROUTINE TESTING W REFLEX): HIV: NONREACTIVE

## 2013-12-27 MED ORDER — OB COMPLETE PETITE 35-5-1-200 MG PO CAPS: 1.0000 | ORAL_CAPSULE | Freq: Every day | ORAL | Status: DC

## 2013-12-27 NOTE — Progress Notes (Signed)
   Subjective:    Mellonie Rhue is a G1P0 [redacted]w[redacted]d being seen today for her first obstetrical visit.  Her obstetrical history is significant for 1st trimester vaginal bleeding. Patient does intend to breast feed. Pregnancy history fully reviewed.  Patient reports no complaints.  Filed Vitals:   12/27/13 1036  BP: 130/83  Temp: 98 F (36.7 C)  Weight: 195 lb (88.451 kg)    HISTORY: OB History  Gravida Para Term Preterm AB SAB TAB Ectopic Multiple Living  1         0    # Outcome Date GA Lbr Len/2nd Weight Sex Delivery Anes PTL Lv  1 CUR              Past Medical History  Diagnosis Date  . Medical history non-contributory    Past Surgical History  Procedure Laterality Date  . No past surgeries     Family History  Problem Relation Age of Onset  . Alcohol abuse Neg Hx   . Arthritis Neg Hx   . Asthma Neg Hx   . Birth defects Neg Hx   . Cancer Neg Hx   . COPD Neg Hx   . Depression Neg Hx   . Diabetes Neg Hx   . Drug abuse Neg Hx   . Early death Neg Hx   . Hearing loss Neg Hx   . Heart disease Neg Hx   . Hyperlipidemia Neg Hx   . Hypertension Neg Hx   . Kidney disease Neg Hx   . Learning disabilities Neg Hx   . Mental illness Neg Hx   . Mental retardation Neg Hx   . Miscarriages / Stillbirths Neg Hx   . Stroke Neg Hx   . Vision loss Neg Hx   . Varicose Veins Neg Hx      Exam    Uterus:     Pelvic Exam:    Perineum: Hemorrhoids   Vulva: normal   Vagina:  normal mucosa, affirm sent        Cervix: no cervical motion tenderness, no lesions and nulliparous appearance   Adnexa: normal adnexa   Bony Pelvis: gynecoid  System: Breast:  normal appearance, no masses or tenderness   Skin: normal coloration and turgor, no rashes    Neurologic: oriented, normal   Extremities: normal strength, tone, and muscle mass   HEENT PERRLA   Mouth/Teeth dental hygiene poor   Neck no masses   Cardiovascular: regular rate and rhythm, no murmurs or gallops   Respiratory:   appears well, vitals normal, no respiratory distress, acyanotic, normal RR, ear and throat exam is normal, neck free of mass or lymphadenopathy, chest clear, no wheezing, crepitations, rhonchi, normal symmetric air entry   Abdomen: soft, non-tender; bowel sounds normal; no masses,  no organomegaly   Urinary: urethral meatus normal      Assessment:    Pregnancy: G1P0 Patient Active Problem List   Diagnosis Date Noted  . Supervision of normal first pregnancy 12/27/2013  1st trimester bleeding, currently resolved      Plan:     Initial labs drawn. Prenatal vitamins. Problem list reviewed and updated. Genetic Screening discussed Quad Screen: next visit.  Ultrasound discussed; fetal survey: between 18-20 weeks.  Follow up in 4 weeks. (2 weeks for reassurance and FHR) Dental referral given Reviewed education reviewed, reviewed warning signs of pregnancy. 80% of 20 min visit spent on counseling and coordination of care.     Shanara Schnieders 12/27/2013

## 2013-12-27 NOTE — Progress Notes (Signed)
Pulse 76 Pt states that she has had small amount of vaginal bleeding.  Pt also has hemmroids that are bleeding as well. Pt was seen in MAU for and lower abdominal pain on Monday. Pt was told cervix was closed.

## 2013-12-28 LAB — OBSTETRIC PANEL
Antibody Screen: NEGATIVE
Basophils Absolute: 0 10*3/uL (ref 0.0–0.1)
Eosinophils Absolute: 0.1 10*3/uL (ref 0.0–0.7)
Eosinophils Relative: 1 % (ref 0–5)
Hemoglobin: 13.6 g/dL (ref 12.0–15.0)
Lymphocytes Relative: 33 % (ref 12–46)
MCH: 32.6 pg (ref 26.0–34.0)
MCV: 95 fL (ref 78.0–100.0)
Neutrophils Relative %: 60 % (ref 43–77)
Platelets: 282 10*3/uL (ref 150–400)
RDW: 13.9 % (ref 11.5–15.5)
WBC: 11 10*3/uL — ABNORMAL HIGH (ref 4.0–10.5)

## 2013-12-28 LAB — WET PREP BY MOLECULAR PROBE
Gardnerella vaginalis: NEGATIVE
Trichomonas vaginosis: NEGATIVE

## 2013-12-28 LAB — CULTURE, OB URINE: Organism ID, Bacteria: NO GROWTH

## 2013-12-28 LAB — PAP IG W/ RFLX HPV ASCU

## 2013-12-28 LAB — GC/CHLAMYDIA PROBE AMP: GC Probe RNA: NEGATIVE

## 2013-12-29 HISTORY — PX: WISDOM TOOTH EXTRACTION: SHX21

## 2013-12-30 LAB — HEMOGLOBINOPATHY EVALUATION
Hemoglobin Other: 0 %
Hgb A2 Quant: 2.6 % (ref 2.2–3.2)
Hgb A: 97.2 % (ref 96.8–97.8)
Hgb F Quant: 0.2 % (ref 0.0–2.0)
Hgb S Quant: 0 %

## 2014-01-13 ENCOUNTER — Encounter: Payer: Self-pay | Admitting: Obstetrics

## 2014-01-13 ENCOUNTER — Ambulatory Visit (INDEPENDENT_AMBULATORY_CARE_PROVIDER_SITE_OTHER): Payer: Medicaid Other | Admitting: Obstetrics

## 2014-01-13 VITALS — BP 127/80 | Temp 97.4°F | Wt 204.0 lb

## 2014-01-13 DIAGNOSIS — Z3400 Encounter for supervision of normal first pregnancy, unspecified trimester: Secondary | ICD-10-CM

## 2014-01-13 LAB — POCT URINALYSIS DIPSTICK
BILIRUBIN UA: NEGATIVE
GLUCOSE UA: NEGATIVE
Ketones, UA: NEGATIVE
LEUKOCYTES UA: NEGATIVE
NITRITE UA: NEGATIVE
Protein, UA: NEGATIVE
RBC UA: NEGATIVE
Spec Grav, UA: 1.025
UROBILINOGEN UA: NEGATIVE
pH, UA: 5

## 2014-01-13 NOTE — Progress Notes (Signed)
Pulse: 82 Patient states she had some vaginal bleeding 2 weeks ago but is not currently having any. Patient states she is having some pain and pressure in both hips but more in the left.

## 2014-01-26 ENCOUNTER — Ambulatory Visit (INDEPENDENT_AMBULATORY_CARE_PROVIDER_SITE_OTHER): Payer: Medicaid Other | Admitting: Obstetrics

## 2014-01-26 ENCOUNTER — Encounter: Payer: Self-pay | Admitting: Obstetrics

## 2014-01-26 VITALS — BP 124/80 | Temp 97.2°F | Wt 210.0 lb

## 2014-01-26 DIAGNOSIS — Z3400 Encounter for supervision of normal first pregnancy, unspecified trimester: Secondary | ICD-10-CM

## 2014-01-26 DIAGNOSIS — O210 Mild hyperemesis gravidarum: Secondary | ICD-10-CM

## 2014-01-26 DIAGNOSIS — O219 Vomiting of pregnancy, unspecified: Secondary | ICD-10-CM

## 2014-01-26 DIAGNOSIS — Z1389 Encounter for screening for other disorder: Secondary | ICD-10-CM

## 2014-01-26 DIAGNOSIS — O3430 Maternal care for cervical incompetence, unspecified trimester: Secondary | ICD-10-CM

## 2014-01-26 DIAGNOSIS — O3432 Maternal care for cervical incompetence, second trimester: Secondary | ICD-10-CM

## 2014-01-26 LAB — POCT URINALYSIS DIPSTICK
BILIRUBIN UA: NEGATIVE
Blood, UA: NEGATIVE
Glucose, UA: NEGATIVE
KETONES UA: NEGATIVE
Leukocytes, UA: NEGATIVE
Nitrite, UA: NEGATIVE
Protein, UA: NEGATIVE
SPEC GRAV UA: 1.02
Urobilinogen, UA: NEGATIVE
pH, UA: 5

## 2014-01-26 MED ORDER — DOXYLAMINE-PYRIDOXINE 10-10 MG PO TBEC: DELAYED_RELEASE_TABLET | ORAL | Status: DC

## 2014-01-26 NOTE — Progress Notes (Signed)
Pulse- 77 Patient states she is having lower abdomen.

## 2014-01-26 NOTE — Addendum Note (Signed)
Addended by: Brock BadHARPER, Jesusa Stenerson A on: 01/26/2014 03:35 PM   Modules accepted: Orders

## 2014-01-30 LAB — AFP, QUAD SCREEN
AFP: 34.5 [IU]/mL
Curr Gest Age: 15.1 wks.days
Down Syndrome Scr Risk Est: 1:64 {titer}
HCG TOTAL: 84502 m[IU]/mL
INH: 1465.5 pg/mL
Interpretation-AFP: POSITIVE — AB
MOM FOR AFP: 1.7
MOM FOR HCG: 4.27
MoM for INH: 10.62
Open Spina bifida: NEGATIVE
Osb Risk: 1:1620 {titer}
TRI 18 SCR RISK EST: NEGATIVE
Trisomy 18 (Edward) Syndrome Interp.: <1:157000 {titer}
UE3 VALUE: 0.3 ng/mL
uE3 Mom: 0.89

## 2014-02-03 ENCOUNTER — Other Ambulatory Visit: Payer: Self-pay | Admitting: Obstetrics

## 2014-02-03 DIAGNOSIS — O280 Abnormal hematological finding on antenatal screening of mother: Secondary | ICD-10-CM

## 2014-02-03 DIAGNOSIS — Z3689 Encounter for other specified antenatal screening: Secondary | ICD-10-CM

## 2014-02-06 ENCOUNTER — Telehealth: Payer: Self-pay | Admitting: *Deleted

## 2014-02-06 NOTE — Telephone Encounter (Signed)
LM on VM for pt to call office back in regards to lab results.

## 2014-02-07 ENCOUNTER — Other Ambulatory Visit: Payer: Self-pay | Admitting: Obstetrics

## 2014-02-07 DIAGNOSIS — O280 Abnormal hematological finding on antenatal screening of mother: Secondary | ICD-10-CM

## 2014-02-15 ENCOUNTER — Ambulatory Visit (HOSPITAL_COMMUNITY)
Admission: RE | Admit: 2014-02-15 | Discharge: 2014-02-15 | Disposition: A | Discharge status: Home / Self Care | Payer: Medicaid Other | Source: Ambulatory Visit | Attending: Obstetrics | Admitting: Obstetrics

## 2014-02-15 VITALS — BP 133/84 | HR 82 | Wt 214.0 lb

## 2014-02-15 DIAGNOSIS — Z1389 Encounter for screening for other disorder: Secondary | ICD-10-CM | POA: Insufficient documentation

## 2014-02-15 DIAGNOSIS — O280 Abnormal hematological finding on antenatal screening of mother: Secondary | ICD-10-CM

## 2014-02-15 DIAGNOSIS — O358XX0 Maternal care for other (suspected) fetal abnormality and damage, not applicable or unspecified: Secondary | ICD-10-CM | POA: Insufficient documentation

## 2014-02-15 DIAGNOSIS — Z363 Encounter for antenatal screening for malformations: Secondary | ICD-10-CM | POA: Insufficient documentation

## 2014-02-15 DIAGNOSIS — Z3689 Encounter for other specified antenatal screening: Secondary | ICD-10-CM

## 2014-02-15 DIAGNOSIS — O289 Unspecified abnormal findings on antenatal screening of mother: Secondary | ICD-10-CM | POA: Insufficient documentation

## 2014-02-15 NOTE — Progress Notes (Signed)
Genetic Counseling  High-Risk Gestation Note  Appointment Date:  02/15/2014 Referred By: Brock BadHarper, Charles A, MD Date of Birth:  08-11-1991 Partner:  Apolinar JunesBrandon   Pregnancy History: G1P0 Estimated Date of Delivery: 07/19/14 Estimated Gestational Age: 3713w0d Attending: Alpha GulaWhitecar, Paul, MD  Ms. Kerby NoraRISTINA Goodhart and her partner, Apolinar JunesBrandon, were seen for genetic counseling because of an increased risk for fetal Down syndrome based on maternal serum Quad screening through [facility].  They were counseled regarding the Quad screen result and the associated 1 in 64 risk for fetal Down syndrome.  We reviewed chromosomes, nondisjunction, and the common features and variable prognosis of Down syndrome.  In addition, we reviewed the screen adjusted reduction in risks for trisomy 18 and ONTDs.  We also discussed other explanations for a screen positive result including: a gestational dating error, differences in maternal metabolism, and normal variation. They understand that this screening is not diagnostic for Down syndrome but provides a risk assessment.  In addition, we discussed that the MShCG (4.27) and MSDIA (10.62) MoM values were atypically elevated.  This couple was counseled that elevations in MShCG/MSDIA are associated with an increased risk for adverse outcomes including: PIH, preeclampsia, poor fetal growth, and preterm delivery. We discussed the recommendation for increased surveillance. A follow-up ultrasound was scheduled in 6 weeks.  We reviewed available screening options including noninvasive prenatal screening (NIPS)/cell free fetal DNA (cffDNA) testing, and detailed ultrasound.  They were counseled that screening tests are used to modify a patient's a priori risk for aneuploidy, typically based on age. This estimate provides a pregnancy specific risk assessment. We reviewed the benefits and limitations of each option. Specifically, we discussed the conditions for which each test screens, the  detection rates, and false positive rates of each. They were also counseled regarding diagnostic testing via amniocentesis. We reviewed the approximate 1 in 300-500 risk for complications for amniocentesis, including spontaneous pregnancy loss.   After consideration of all the options, they elected to have a detailed ultrasound.  A complete ultrasound was performed today. The ultrasound report will be documented separately. There were no visualized fetal anomalies or markers suggestive of aneuploidy. Diagnostic testing and NIPS/cffDNA were declined today.  They understand that screening tests cannot rule out all birth defects or genetic syndromes. The patient was advised of this limitation and states she still does not want additional testing at this time.   Tina [name] was provided with written information regarding sickle cell anemia (SCA) including the carrier frequency and incidence in the Hispanic population, the availability of carrier testing and prenatal diagnosis if indicated.  In addition, we discussed that hemoglobinopathies are routinely screened for as part of the [place] newborn screening panel.  She declined hemoglobin electrophoresis today.   Both family histories were reviewed and found to be contributory for the father of the baby having a maternal half sister who has autism.  We discussed that autism is part of the spectrum of conditions referred to as Autistic spectrum disorders (ASD). We discussed that ASDs are among the most common neurodevelopmental disorders, with approximately 1 in 88 children meeting criteria for ASD. Approximately 80% of individuals diagnosed are female. There is strong evidence that genetic factors play a critical role in development of ASD. There have been recent advances in identifying specific genetic causes of ASD, however, there are still many individuals for whom the etiology of the ASD is not known.  We discussed that the risk of autism in the fetus is increased above  general population risks;  however, at this time there is not genetic testing available for ASD for most families.  The remainder of the family histories were noncontributory for birth defects, mental retardation, and known genetic conditions.  Without further information regarding the provided family history, an accurate genetic risk cannot be calculated. Further genetic counseling is warranted if more information is obtained.  Ms. Laguardia denied exposure to environmental toxins or chemical agents. She denied the use of alcohol, tobacco or street drugs. She denied significant viral illnesses during the course of her pregnancy. Her medical and surgical histories were noncontributory.  I counseled this couple for approximately 35 minutes regarding the above risks and available options.   Despina Arias, MS  Certified Genetic Counselor

## 2014-02-21 ENCOUNTER — Encounter: Payer: Self-pay | Admitting: Obstetrics

## 2014-02-21 ENCOUNTER — Encounter: Payer: Medicaid Other | Admitting: Obstetrics

## 2014-02-21 ENCOUNTER — Ambulatory Visit (INDEPENDENT_AMBULATORY_CARE_PROVIDER_SITE_OTHER): Payer: Medicaid Other | Admitting: Obstetrics

## 2014-02-21 ENCOUNTER — Other Ambulatory Visit: Payer: Medicaid Other

## 2014-02-21 VITALS — BP 144/78 | Temp 98.3°F | Wt 219.0 lb

## 2014-02-21 DIAGNOSIS — O0990 Supervision of high risk pregnancy, unspecified, unspecified trimester: Secondary | ICD-10-CM

## 2014-02-21 NOTE — Progress Notes (Signed)
Pulse 78 Pt states that she is having night sweats.

## 2014-02-27 ENCOUNTER — Encounter: Payer: Medicaid Other | Admitting: Obstetrics

## 2014-03-12 ENCOUNTER — Inpatient Hospital Stay (HOSPITAL_COMMUNITY): Payer: Medicaid Other

## 2014-03-12 ENCOUNTER — Inpatient Hospital Stay (HOSPITAL_COMMUNITY)
Admission: AD | Admit: 2014-03-12 | Discharge: 2014-03-18 | DRG: 782 | Disposition: A | Discharge status: Home / Self Care | Payer: Medicaid Other | Source: Ambulatory Visit | Attending: Obstetrics | Admitting: Obstetrics

## 2014-03-12 ENCOUNTER — Encounter (HOSPITAL_COMMUNITY): Payer: Self-pay | Admitting: *Deleted

## 2014-03-12 DIAGNOSIS — O208 Other hemorrhage in early pregnancy: Secondary | ICD-10-CM | POA: Diagnosis present

## 2014-03-12 DIAGNOSIS — O418X20 Other specified disorders of amniotic fluid and membranes, second trimester, not applicable or unspecified: Secondary | ICD-10-CM

## 2014-03-12 DIAGNOSIS — O4690 Antepartum hemorrhage, unspecified, unspecified trimester: Secondary | ICD-10-CM

## 2014-03-12 DIAGNOSIS — R109 Unspecified abdominal pain: Secondary | ICD-10-CM | POA: Diagnosis present

## 2014-03-12 DIAGNOSIS — O4590 Premature separation of placenta, unspecified, unspecified trimester: Principal | ICD-10-CM | POA: Diagnosis present

## 2014-03-12 DIAGNOSIS — O4592 Premature separation of placenta, unspecified, second trimester: Secondary | ICD-10-CM | POA: Diagnosis present

## 2014-03-12 DIAGNOSIS — O468X2 Other antepartum hemorrhage, second trimester: Secondary | ICD-10-CM

## 2014-03-12 LAB — CBC
HEMATOCRIT: 35.7 % — AB (ref 36.0–46.0)
HEMOGLOBIN: 12.5 g/dL (ref 12.0–15.0)
MCH: 33.3 pg (ref 26.0–34.0)
MCHC: 35 g/dL (ref 30.0–36.0)
MCV: 95.2 fL (ref 78.0–100.0)
Platelets: 246 10*3/uL (ref 150–400)
RBC: 3.75 MIL/uL — ABNORMAL LOW (ref 3.87–5.11)
RDW: 13.1 % (ref 11.5–15.5)
WBC: 15.1 10*3/uL — AB (ref 4.0–10.5)

## 2014-03-12 LAB — TYPE AND SCREEN
ABO/RH(D): O POS
ANTIBODY SCREEN: NEGATIVE

## 2014-03-12 MED ORDER — LACTATED RINGERS IV SOLN
INTRAVENOUS | Status: DC
  Administered 2014-03-12 (×2): 50 mL/h via INTRAVENOUS
  Administered 2014-03-13 – 2014-03-15 (×4): via INTRAVENOUS

## 2014-03-12 MED ORDER — ACETAMINOPHEN 325 MG PO TABS
650.0000 mg | ORAL_TABLET | ORAL | Status: DC | PRN
  Administered 2014-03-13: 650 mg via ORAL
  Filled 2014-03-12: qty 2

## 2014-03-12 MED ORDER — INFLUENZA VAC SPLIT QUAD 0.5 ML IM SUSP
0.5000 mL | INTRAMUSCULAR | Status: DC
  Filled 2014-03-12: qty 0.5

## 2014-03-12 MED ORDER — ZOLPIDEM TARTRATE 5 MG PO TABS
5.0000 mg | ORAL_TABLET | Freq: Every evening | ORAL | Status: DC | PRN
  Administered 2014-03-13: 5 mg via ORAL
  Filled 2014-03-12: qty 1

## 2014-03-12 MED ORDER — CALCIUM CARBONATE ANTACID 500 MG PO CHEW: 2.0000 | CHEWABLE_TABLET | ORAL | Status: DC | PRN

## 2014-03-12 MED ORDER — DOCUSATE SODIUM 100 MG PO CAPS
100.0000 mg | ORAL_CAPSULE | Freq: Every day | ORAL | Status: DC
  Administered 2014-03-12 – 2014-03-13 (×2): 100 mg via ORAL
  Filled 2014-03-12 (×2): qty 1

## 2014-03-12 MED ORDER — PRENATAL MULTIVITAMIN CH
1.0000 | ORAL_TABLET | Freq: Every day | ORAL | Status: DC
  Administered 2014-03-12 – 2014-03-18 (×7): 1 via ORAL
  Filled 2014-03-12 (×7): qty 1

## 2014-03-12 NOTE — MAU Provider Note (Signed)
Chief Complaint: Vaginal Bleeding   None    SUBJECTIVE HPI: Tina [name] is a 23 y.o. G1P0 at 341w4d by LMP who presents to maternity admissions reporting bright red vaginal bleeding when wiping this morning after urinating.  Upon arrival in MAU she reports increase in bleeding and requires a pad.  Her pregnancy has been normal except for abnormal quad screen with elevated risk of Down Syndrome, pt declined amniocentesis.  Last intercourse was over 2 weeks ago.  She reports good fetal movement, denies LOF, vaginal itching/burning, urinary symptoms, h/a, dizziness, n/v, or fever/chills.     Past Medical History  Diagnosis Date  . Medical history non-contributory    Past Surgical History  Procedure Laterality Date  . No past surgeries     History   Social History  . Marital Status: Single    Spouse Name: N/A    Number of Children: N/A  . Years of Education: N/A   Occupational History  . Not on file.   Social History Main Topics  . Smoking status: Never Smoker   . Smokeless tobacco: Not on file  . Alcohol Use: No  . Drug Use: No  . Sexual Activity: Not Currently   Other Topics Concern  . Not on file   Social History Narrative  . No narrative on file   No current facility-administered medications on file prior to encounter.   No current outpatient prescriptions on file prior to encounter.   No Known Allergies  Results for orders placed during the hospital encounter of 03/12/14 (from the past 48 hour(s))  CBC     Status: Abnormal   Collection Time    03/12/14  8:00 AM      Result Value Ref Range   WBC 15.1 (*) 4.0 - 10.5 K/uL   RBC 3.75 (*) 3.87 - 5.11 MIL/uL   Hemoglobin 12.5  12.0 - 15.0 g/dL   HCT 96.035.7 (*) 45.436.0 - 09.846.0 %   MCV 95.2  78.0 - 100.0 fL   MCH 33.3  26.0 - 34.0 pg   MCHC 35.0  30.0 - 36.0 g/dL   RDW 11.913.1  14.711.5 - 82.915.5 %   Platelets 246  150 - 400 K/uL    ROS: Pertinent items in HPI  OBJECTIVE Blood pressure 133/89, pulse 78, temperature 98.3 F  (36.8 C), resp. rate 18, last menstrual period 10/12/2013. GENERAL: Well-developed, well-nourished female in no acute distress.  HEENT: Normocephalic HEART: normal rate RESP: normal effort ABDOMEN: Soft, non-tender EXTREMITIES: Nontender, no edema NEURO: Alert and oriented SPECULUM EXAM: Deferred until ultrasound  Speculum exam: Vagina - large amount of bright red blood in vaginal canal  Cervix - +active bleeding, small clot noted at cervical os  Bimanual exam: deferred  Cervix appears closed  Chaperone present for exam.  FHT 147 by doppler  CBC pending and pt sent for Limited Ob ultrasound  Report to Venia CarbonJennifer Rasch, NP  Sharen CounterLisa Leftwich-Kirby Certified Nurse-Midwife 03/12/2014  7:59 AM   US report shows a 4.7 x 5.9 x 2.9 cm hypoechoic area to the right of the placenta fundus. Adjacent portion of membrane seen undulating in amniotic fluid. Findings consistent with subchorionic hemorrhage.  0915: discussed US findings and physical exam with Dr. Clearance CootsHarper. Plan to admit patient to [facility] with routine orders. RN to call Dr. Clearance CootsHarper for further plan of care.  O positive blood type   Assessment:  Vaginal bleeding in pregnancy; second trimester Subchorionic hemorrhage  Plan:  Admit to [facility] unit per Dr. Clearance CootsHarper  Iona Hansen Rasch, NP 03/12/2014 9:38 AM

## 2014-03-12 NOTE — Progress Notes (Signed)
Dr Clearance CootsHarper notified that we were unable to obtain infant heart tones.

## 2014-03-12 NOTE — H&P (Signed)
Tina [name] is a 23 y.o. female presenting for vaginal. Maternal Medical History:  Reason for admission: 23 yo G1.  EDC 07-19-14.  Presents with vaginal bleeding.  Prenatal Complications - Diabetes: none.    OB History   Grav Para Term Preterm Abortions TAB SAB Ect Mult Living   1         0     Past Medical History  Diagnosis Date  . Medical history non-contributory    Past Surgical History  Procedure Laterality Date  . No past surgeries     Family History: family history is negative for Alcohol abuse, Arthritis, Asthma, Birth defects, Cancer, COPD, Depression, Diabetes, Drug abuse, Early death, Hearing loss, Heart disease, Hyperlipidemia, Hypertension, Kidney disease, Learning disabilities, Mental illness, Mental retardation, Miscarriages / Stillbirths, Stroke, Vision loss, and Varicose Veins. Social History:  reports that she has never smoked. She does not have any smokeless tobacco history on file. She reports that she does not drink alcohol or use illicit drugs.   Prenatal Transfer Tool  Maternal Diabetes: No Genetic Screening: Abnormal:  Results: Elevated risk of Trisomy 21 Maternal Ultrasounds/Referrals: Abnormal:  Findings:   Other:  Subchorionic hemorrhage Fetal Ultrasounds or other Referrals:  Referred to Materal Fetal Medicine  Maternal Substance Abuse:  No Significant Maternal Medications:  None Significant Maternal Lab Results:  None Other Comments:  None  Review of Systems  All other systems reviewed and are negative.      Blood pressure 133/89, pulse 78, temperature 98.3 F (36.8 C), resp. rate 18, last menstrual period 10/12/2013. Maternal Exam:  Abdomen: Patient reports no abdominal tenderness. Cervix: Cervix evaluated by sterile speculum exam.     Physical Exam  Nursing note and vitals reviewed. Constitutional: She is oriented to person, place, and time. She appears well-developed and well-nourished.  HENT:  Head: Normocephalic and atraumatic.   Eyes: Conjunctivae are normal. Pupils are equal, round, and reactive to light.  Neck: Normal range of motion. Neck supple.  Cardiovascular: Normal rate and regular rhythm.   Respiratory: Effort normal.  GI: Soft.  Genitourinary: Vagina normal and uterus normal.  Musculoskeletal: Normal range of motion.  Neurological: She is alert and oriented to person, place, and time.  Skin: Skin is warm and dry.  Psychiatric: She has a normal mood and affect. Her behavior is normal. Judgment and thought content normal.    Prenatal labs: ABO, Rh: O/POS/-- (12/30 1453) Antibody: NEG (12/30 1453) Rubella: 0.87 (12/30 1453) RPR: NON REAC (12/30 1453)  HBsAg: NEGATIVE (12/30 1453)  HIV: NON REACTIVE (12/30 1453)  GBS:     Assessment/Plan: 21 weeks.  Vaginal bleeding.  Subchorionic hemorrhage on ultrasound.  Stable.  Admit.  Bedrest.   Aunesty Tyson A 03/12/2014, 9:37 AM

## 2014-03-12 NOTE — MAU Note (Signed)
Pt presents with complaints of bright red vaginal bleeding that started this morning when she went to the bathroom. Pt states the lower part of her abdomen is sore but has been sore and it is nothing new to her. Says she is being followed closely by ultrasound due to her placenta being small.

## 2014-03-13 MED ORDER — PROMETHAZINE HCL 25 MG/ML IJ SOLN: 12.5000 mg | Freq: Four times a day (QID) | INTRAMUSCULAR | Status: DC | PRN

## 2014-03-13 NOTE — Progress Notes (Signed)
Pt states up to br saw few drops of bright red blood on pad and in toilet bowl

## 2014-03-13 NOTE — Progress Notes (Signed)
I received a referral from pt's RN because of difficult medical situation.  Pt was in room with many family members and did not wish to talk at this time, but was open to follow up support.  We will see her as time permits, but please also page as needs arise for her.  [address]Chaplain [place] Pager, [phone] 4:11 PM   03/13/14 1600  Clinical Encounter Type  Visited With Patient and family together  Visit Type Initial  Referral From Nurse

## 2014-03-13 NOTE — Progress Notes (Signed)
Ur chart review completed.  

## 2014-03-13 NOTE — Progress Notes (Signed)
Patient ID: Kerby NoraRISTINA Conaty, female   DOB: 1991/04/14, 23 y.o.   MRN: [id number] Hospital Day: 2  S: Preterm labor symptoms: Vaginal bleeding and cramping.  O: Blood pressure 125/77, pulse 77, temperature 98.1 F (36.7 C), temperature source Oral, resp. rate 18, height 5\' 1"  (1.549 m), weight 221 lb (100.245 kg), last menstrual period 10/12/2013.   NWG:NFAOZHYQFHT:Baseline: 150 bpm Toco: None SVE:   Deferred PE:      Abdomen:  Soft.  Uterus mildly tender.  FHR ~150bpm with Doppler.      Vagina:  Dry. A/P- 23 y.o. admitted with:  Vaginal bleeding and cramping.  Subchorionic hemorrhage on ultrasound.  Stable.  Continue bedrest.  Present on Admission:  . Vaginal bleeding before [redacted] weeks gestation  Pregnancy Complications: Unstable Placentation.  Preterm labor management: no treatment necessary Dating:  678w5d PNL Needed:  none FWB:  good PTL:  stable

## 2014-03-14 MED ORDER — DOCUSATE SODIUM 100 MG PO CAPS
100.0000 mg | ORAL_CAPSULE | Freq: Two times a day (BID) | ORAL | Status: DC
  Administered 2014-03-14 – 2014-03-18 (×8): 100 mg via ORAL
  Filled 2014-03-14 (×8): qty 1

## 2014-03-14 NOTE — Progress Notes (Signed)
Patient ID: Tina [name], female   DOB: 1991-01-10, 23 y.o.   MRN: [id number] Hospital Day: 3  S: No complaints.  Less pain.  Scant bleeding.  O: Blood pressure 127/75, pulse 99, temperature 98.4 F (36.9 C), temperature source Oral, resp. rate 20, height 5\' 1"  (1.549 m), weight 221 lb (100.245 kg), last menstrual period 10/12/2013, SpO2 98.00%.   BMW:UXLKGMWNFHT:Baseline: 145 bpm Toco: None SVE:   A/P- 23 y.o. admitted with:  Vaginal bleeding and cramping.  Present on Admission:  . Vaginal bleeding before [redacted] weeks gestation  Pregnancy Complications: Subchorionic hemorrhage  Preterm labor management: no treatment necessary Dating:  5659w6d PNL Needed:  None FWB:  Good PTL:  Stable

## 2014-03-14 NOTE — Progress Notes (Signed)
03/14/14 1600  Clinical Encounter Type  Visited With Patient and family together (mom)  Visit Type Follow-up;Spiritual support;Social support  Referral From Chaplain ([address], [place])  Spiritual Encounters  Spiritual Needs Emotional  Stress Factors  Patient Stress Factors Loss of control;Major life changes (unexpected early ante stay; first pregnancy)   Followed up to offer further support.  Tina [name] engaged well in conversation, asking many questions about the type of care and training that [name] provides.  She was very appreciative to know of chaplain availability.   will follow as time permits.  Please also page as needs arise.  Thank you.  [address]Chaplain Favor Kreh [place], [place] [phone]

## 2014-03-15 LAB — TYPE AND SCREEN
ABO/RH(D): O POS
Antibody Screen: NEGATIVE

## 2014-03-15 MED ORDER — ACETAMINOPHEN 160 MG/5ML PO SOLN
650.0000 mg | ORAL | Status: DC | PRN
  Administered 2014-03-15 – 2014-03-17 (×3): 650 mg via ORAL
  Filled 2014-03-15 (×3): qty 20.3

## 2014-03-15 MED ORDER — SODIUM CHLORIDE 0.9 % IJ SOLN
3.0000 mL | Freq: Two times a day (BID) | INTRAMUSCULAR | Status: DC
  Administered 2014-03-15 – 2014-03-18 (×6): 3 mL via INTRAVENOUS

## 2014-03-15 NOTE — Progress Notes (Signed)
Patient ID: Kerby NoraCristina Sherk, female   DOB: [birth year], 23 y.o.   MRN: [id number] Hospital Day: 4  S: No complaints.  O: Blood pressure 136/89, pulse 83, temperature 97.4 F (36.3 C), temperature source Axillary, resp. rate 18, height 5\' 1"  (1.549 m), weight 221 lb (100.245 kg), last menstrual period 10/12/2013, SpO2 98.00%.   JXB:JYNWGNFAFHT:Baseline: 150 bpm Toco: None SVE:   A/P- 23 y.o. admitted with:  Vaginal bleeding and cramping.  Subchorionic hemorrhage on U/S.  Stable.  Continue bedrest.  Repeat U/S on Friday.  Present on Admission:  . Vaginal bleeding before [redacted] weeks gestation  Pregnancy Complications: Subchorionic hemorrhage  Preterm labor management: no treatment necessary Dating:  6336w0d PNL Needed:  None FWB:  good PTL:  stable

## 2014-03-16 ENCOUNTER — Inpatient Hospital Stay (HOSPITAL_COMMUNITY): Payer: Medicaid Other

## 2014-03-16 NOTE — Progress Notes (Signed)
Patient ID: [name], female   DOB: 1991-08-11, 23 y.o.   MRN: [id number] Hospital Day: 5  S: Mild pain, and pink spotting.  O: Blood pressure 128/78, pulse 82, temperature 98.6 F (37 C), temperature source Oral, resp. rate 18, height 5\' 1"  (1.549 m), weight 221 lb (100.245 kg), last menstrual period 10/12/2013, SpO2 98.00%.   WUJ:WJXBJYNWFHT:Baseline: 150 bpm Toco: None SVE:   A/P- 23 y.o. admitted with:  Cramping and vaginal bleeding.  Stable.  Continue bedrest.  Present on Admission:  . Vaginal bleeding before [redacted] weeks gestation  Pregnancy Complications: Subchorionic hemorrhage  Preterm labor management: bedrest advised Dating:  1514w1d PNL Needed:  none FWB:  good PTL:  stable

## 2014-03-17 NOTE — Progress Notes (Signed)
Patient ID: Tina [name], female   DOB: 07-Jun-1991, 23 y.o.   MRN: [id number] Vital signs normal No bleeding No contractions Continue present therapy

## 2014-03-17 NOTE — Progress Notes (Signed)
Chaplain received referral from patient's RN, who said this is patient's first pregnancy and she is having some anxiety regarding complications. Chaplain visited with patient and FOB. They were receptive to pastoral conversation, but did not have any specific needs at this time. She expressed gratitude for chaplain support. Will follow up as able, but please page if needed.   Guy SandiferHillary D Brethrenrust[place], [place]Chaplain [phone]

## 2014-03-18 NOTE — Discharge Instructions (Signed)
Vaginal Bleeding During Pregnancy  A small amount of bleeding from the vagina can happen anytime during pregnancy. It usually stops on its own. However, some bleeding can be serious. Be sure to tell your doctor about all vaginal bleeding.  HOME CARE   Get plenty of rest and sleep.   Stay in bed and only get up to go to the bathroom as told by your doctor.   Write down the number of pads you use each day. Note how soaked they are.   Do not use tampons. Do not clean the vagina with a stream of water (douche).   Do not have sex (intercourse) or put anything into your vagina. Have this approved by your doctor.   Save any tissue that comes from your vagina. Show it to your doctor.   Only take medicine as told by your doctor.   Follow your doctor's advice about lifting, driving, and physical activity.  GET HELP RIGHT AWAY IF:    You feel your baby move less or not at all.   You pass out (faint) while going to the bathroom.   You have more bleeding.   You start to have contractions.   You have severe cramps in your stomach, back, or belly (abdomen).   You are leaking fluid or have a gush of fluid from your vagina.   You become lightheaded or weak.   You have chills.   You have clumps of tissue or blood clots coming from your vagina.   You have a fever.  MAKE SURE YOU:    Understand these instructions.   Will watch your condition.   Will get help right away if you are not doing well or get worse.  Document Released: 09/23/2008 Document Revised: 12/01/2012 Document Reviewed: 10/04/2012  ExitCare Patient Information 2014 [employer].

## 2014-03-18 NOTE — Discharge Summary (Signed)
  Physician Discharge Summary  Patient ID: Tina [name] MRN: [id number] DOB/AGE: 02-Apr-1991 22 y.o.  Admit date: 03/12/2014 Discharge date: 03/18/2014  Admission Diagnoses: Antepartum placental abruption in second trimester  Discharge Diagnoses:  Principal Problem:   Antepartum placental abruption in second trimester   Discharged Condition: stable  Hospital Course: The patient was admitted and monitored on bedrest.  The bleeding/cramping subsided.  A follow-up U/S showed a 2 - 3 cm resolving subchorionic hematoma.  Consults: MFM  Significant Diagnostic Studies: see above  Treatments: see above  Discharge Exam: Blood pressure 128/76, pulse 81, temperature 98.4 F (36.9 C), temperature source Oral, resp. rate 18, height 5\' 1"  (1.549 m), weight 100.245 kg (221 lb), last menstrual period 10/12/2013, SpO2 98.00%. General appearance: alert GI: soft, non-tender; bowel sounds normal; no masses,  no organomegaly Extremities: Homans sign is negative, no sign of DVT PV loss: none seen  Disposition: 01-Home or Self Care  Discharge Orders   Future Appointments Provider Department Dept Phone   03/29/2014 4:00 PM [facility] [phone]   03/30/2014 9:00 AM [facility] [phone]   03/30/2014 9:30 AM Brock Badharles A Harper, MD [facility] [phone]   Future Orders Complete By Expires   Discharge activity:  As directed    Comments:     No strenuous activity   Discharge diet:  No restrictions  As directed    Do not have sex or do anything that might make you have an orgasm  As directed    Notify physician for a general feeling that "something is not right"  As directed    Notify physician for increase or change in vaginal discharge  As directed    Notify physician for intestinal cramps, with or without diarrhea, sometimes described as "gas pain"  As directed    Notify physician for leaking of fluid  As directed    Notify physician for low, dull backache, unrelieved by heat or Tylenol  As directed    Notify physician for menstrual like cramps  As directed    Notify physician for pelvic pressure  As directed    Notify physician for uterine contractions.  These may be painless and feel like the uterus is tightening or the baby is  "balling up"  As directed    Notify physician for vaginal bleeding  As directed    PRETERM LABOR:  Includes any of the follwing symptoms that occur between 20 - [redacted] weeks gestation.  If these symptoms are not stopped, preterm labor can result in preterm delivery, placing your baby at risk  As directed        Medication List    Notice   You have not been prescribed any medications.         Follow-up Information   Follow up with HARPER,CHARLES A, MD. Schedule an appointment as soon as possible for a visit in 1 week.   Specialty:  Obstetrics and Gynecology   Contact information:   [address] [place] [address] [phone]       Signed: Roseanna RainbowJACKSON-MOORE,Keviana Guida A 03/18/2014, 12:24 PM

## 2014-03-21 ENCOUNTER — Encounter: Payer: Self-pay | Admitting: Obstetrics

## 2014-03-21 ENCOUNTER — Ambulatory Visit (INDEPENDENT_AMBULATORY_CARE_PROVIDER_SITE_OTHER): Payer: Medicaid Other | Admitting: Obstetrics

## 2014-03-21 VITALS — BP 134/92 | Temp 98.2°F | Wt 227.0 lb

## 2014-03-21 DIAGNOSIS — O99619 Diseases of the digestive system complicating pregnancy, unspecified trimester: Secondary | ICD-10-CM

## 2014-03-21 DIAGNOSIS — O9989 Other specified diseases and conditions complicating pregnancy, childbirth and the puerperium: Secondary | ICD-10-CM

## 2014-03-21 DIAGNOSIS — IMO0002 Reserved for concepts with insufficient information to code with codable children: Secondary | ICD-10-CM

## 2014-03-21 DIAGNOSIS — K5900 Constipation, unspecified: Secondary | ICD-10-CM

## 2014-03-21 DIAGNOSIS — Z3400 Encounter for supervision of normal first pregnancy, unspecified trimester: Secondary | ICD-10-CM

## 2014-03-21 LAB — POCT URINALYSIS DIPSTICK
BILIRUBIN UA: NEGATIVE
GLUCOSE UA: NEGATIVE
Ketones, UA: NEGATIVE
Nitrite, UA: NEGATIVE
Protein, UA: NEGATIVE
Spec Grav, UA: 1.015
Urobilinogen, UA: NEGATIVE
pH, UA: 5

## 2014-03-21 MED ORDER — PNV PRENATAL PLUS MULTIVITAMIN 27-1 MG PO TABS: 1.0000 | ORAL_TABLET | Freq: Every day | ORAL | Status: DC

## 2014-03-21 MED ORDER — DOCUSATE SODIUM 100 MG PO CAPS: 100.0000 mg | ORAL_CAPSULE | Freq: Every day | ORAL | Status: DC

## 2014-03-21 NOTE — Progress Notes (Signed)
Pulse 83 Pt states that she has had some pink d/c in being home from hospital.   Pt would like to know if dating will change since u/s in hospital last week.

## 2014-03-22 ENCOUNTER — Encounter: Payer: Self-pay | Admitting: Obstetrics

## 2014-03-23 ENCOUNTER — Encounter (HOSPITAL_COMMUNITY): Payer: Self-pay | Admitting: *Deleted

## 2014-03-23 ENCOUNTER — Inpatient Hospital Stay (HOSPITAL_COMMUNITY)
Admission: AD | Admit: 2014-03-23 | Discharge: 2014-03-24 | DRG: 774 | Disposition: A | Discharge status: Home / Self Care | Payer: Medicaid Other | Source: Ambulatory Visit | Attending: Obstetrics | Admitting: Obstetrics

## 2014-03-23 ENCOUNTER — Inpatient Hospital Stay (HOSPITAL_COMMUNITY)
Admit: 2014-03-23 | Discharge: 2014-03-23 | Disposition: A | Discharge status: Home / Self Care | Payer: Medicaid Other | Attending: Advanced Practice Midwife | Admitting: Advanced Practice Midwife

## 2014-03-23 ENCOUNTER — Inpatient Hospital Stay (HOSPITAL_COMMUNITY): Payer: Medicaid Other

## 2014-03-23 DIAGNOSIS — O4700 False labor before 37 completed weeks of gestation, unspecified trimester: Secondary | ICD-10-CM

## 2014-03-23 DIAGNOSIS — Z3400 Encounter for supervision of normal first pregnancy, unspecified trimester: Secondary | ICD-10-CM

## 2014-03-23 DIAGNOSIS — O368990 Maternal care for other specified fetal problems, unspecified trimester, not applicable or unspecified: Secondary | ICD-10-CM | POA: Diagnosis present

## 2014-03-23 DIAGNOSIS — O4592 Premature separation of placenta, unspecified, second trimester: Secondary | ICD-10-CM

## 2014-03-23 DIAGNOSIS — O039 Complete or unspecified spontaneous abortion without complication: Secondary | ICD-10-CM | POA: Diagnosis present

## 2014-03-23 DIAGNOSIS — O4590 Premature separation of placenta, unspecified, unspecified trimester: Principal | ICD-10-CM | POA: Diagnosis present

## 2014-03-23 DIAGNOSIS — O364XX0 Maternal care for intrauterine death, not applicable or unspecified: Secondary | ICD-10-CM | POA: Diagnosis present

## 2014-03-23 DIAGNOSIS — O43899 Other placental disorders, unspecified trimester: Secondary | ICD-10-CM

## 2014-03-23 LAB — URINALYSIS, ROUTINE W REFLEX MICROSCOPIC
Bilirubin Urine: NEGATIVE
Glucose, UA: NEGATIVE mg/dL
Hgb urine dipstick: NEGATIVE
Ketones, ur: NEGATIVE mg/dL
Leukocytes, UA: NEGATIVE
NITRITE: NEGATIVE
Protein, ur: NEGATIVE mg/dL
SPECIFIC GRAVITY, URINE: 1.01 (ref 1.005–1.030)
UROBILINOGEN UA: 0.2 mg/dL (ref 0.0–1.0)
pH: 5.5 (ref 5.0–8.0)

## 2014-03-23 LAB — CBC
HCT: 32.9 % — ABNORMAL LOW (ref 36.0–46.0)
Hemoglobin: 11.1 g/dL — ABNORMAL LOW (ref 12.0–15.0)
MCH: 32.6 pg (ref 26.0–34.0)
MCHC: 33.7 g/dL (ref 30.0–36.0)
MCV: 96.5 fL (ref 78.0–100.0)
Platelets: 275 10*3/uL (ref 150–400)
RBC: 3.41 MIL/uL — AB (ref 3.87–5.11)
RDW: 13.5 % (ref 11.5–15.5)
WBC: 15.8 10*3/uL — ABNORMAL HIGH (ref 4.0–10.5)

## 2014-03-23 LAB — COMPREHENSIVE METABOLIC PANEL
ALT: 21 U/L (ref 0–35)
AST: 22 U/L (ref 0–37)
Albumin: 2.5 g/dL — ABNORMAL LOW (ref 3.5–5.2)
Alkaline Phosphatase: 99 U/L (ref 39–117)
BUN: 15 mg/dL (ref 6–23)
CALCIUM: 9.4 mg/dL (ref 8.4–10.5)
CO2: 24 meq/L (ref 19–32)
CREATININE: 0.61 mg/dL (ref 0.50–1.10)
Chloride: 106 mEq/L (ref 96–112)
GFR calc non Af Amer: >90 mL/min (ref >90)
Glucose, Bld: 84 mg/dL (ref 70–99)
Potassium: 4.7 mEq/L (ref 3.7–5.3)
Sodium: 141 mEq/L (ref 137–147)
TOTAL PROTEIN: 6.4 g/dL (ref 6.0–8.3)
Total Bilirubin: <0.2 mg/dL — ABNORMAL LOW (ref 0.3–1.2)

## 2014-03-23 LAB — TYPE AND SCREEN
ABO/RH(D): O POS
ANTIBODY SCREEN: NEGATIVE

## 2014-03-23 MED ORDER — PRENATAL MULTIVITAMIN CH
1.0000 | ORAL_TABLET | Freq: Every day | ORAL | Status: DC
  Filled 2014-03-23: qty 1

## 2014-03-23 MED ORDER — PROMETHAZINE HCL 25 MG/ML IJ SOLN
25.0000 mg | Freq: Once | INTRAMUSCULAR | Status: AC
  Administered 2014-03-23: 25 mg via INTRAMUSCULAR
  Filled 2014-03-23: qty 1

## 2014-03-23 MED ORDER — ACETAMINOPHEN 325 MG PO TABS: 650.0000 mg | ORAL_TABLET | ORAL | Status: DC | PRN

## 2014-03-23 MED ORDER — MORPHINE SULFATE 10 MG/ML IJ SOLN
10.0000 mg | Freq: Once | INTRAMUSCULAR | Status: AC
  Administered 2014-03-23: 10 mg via INTRAMUSCULAR
  Filled 2014-03-23: qty 1

## 2014-03-23 MED ORDER — DOCUSATE SODIUM 100 MG PO CAPS: 100.0000 mg | ORAL_CAPSULE | Freq: Every day | ORAL | Status: DC

## 2014-03-23 MED ORDER — ZOLPIDEM TARTRATE 5 MG PO TABS
5.0000 mg | ORAL_TABLET | Freq: Every evening | ORAL | Status: DC | PRN
  Administered 2014-03-23: 5 mg via ORAL
  Filled 2014-03-23: qty 1

## 2014-03-23 MED ORDER — ACETAMINOPHEN 325 MG PO TABS
650.0000 mg | ORAL_TABLET | ORAL | Status: DC | PRN
  Administered 2014-03-23: 650 mg via ORAL
  Filled 2014-03-23: qty 2

## 2014-03-23 MED ORDER — MAGNESIUM SULFATE BOLUS VIA INFUSION
6.0000 g | Freq: Once | INTRAVENOUS | Status: AC
  Administered 2014-03-23: 6 g via INTRAVENOUS
  Filled 2014-03-23: qty 500

## 2014-03-23 MED ORDER — BUTORPHANOL TARTRATE 1 MG/ML IJ SOLN: 1.0000 mg | INTRAMUSCULAR | Status: DC | PRN

## 2014-03-23 MED ORDER — PRENATAL MULTIVITAMIN CH: 1.0000 | ORAL_TABLET | Freq: Every day | ORAL | Status: DC

## 2014-03-23 MED ORDER — MAGNESIUM SULFATE 40 G IN LACTATED RINGERS - SIMPLE
2.0000 g/h | INTRAVENOUS | Status: DC
  Filled 2014-03-23: qty 500

## 2014-03-23 MED ORDER — CALCIUM CARBONATE ANTACID 500 MG PO CHEW
2.0000 | CHEWABLE_TABLET | ORAL | Status: DC | PRN
  Filled 2014-03-23: qty 2

## 2014-03-23 MED ORDER — ZOLPIDEM TARTRATE 5 MG PO TABS: 5.0000 mg | ORAL_TABLET | Freq: Every evening | ORAL | Status: DC | PRN

## 2014-03-23 MED ORDER — OXYTOCIN 40 UNITS IN LACTATED RINGERS INFUSION - SIMPLE MED
INTRAVENOUS | Status: AC
  Administered 2014-03-23: 40 [IU]
  Filled 2014-03-23: qty 1000

## 2014-03-23 MED ORDER — CALCIUM CARBONATE ANTACID 500 MG PO CHEW: 2.0000 | CHEWABLE_TABLET | ORAL | Status: DC | PRN

## 2014-03-23 MED ORDER — LACTATED RINGERS IV SOLN
INTRAVENOUS | Status: DC
  Administered 2014-03-23: 11:00:00 via INTRAVENOUS

## 2014-03-23 MED ORDER — MAGNESIUM SULFATE BOLUS VIA INFUSION
4.0000 g | Freq: Once | INTRAVENOUS | Status: DC
  Filled 2014-03-23: qty 500

## 2014-03-23 MED ORDER — DOCUSATE SODIUM 100 MG PO CAPS
100.0000 mg | ORAL_CAPSULE | Freq: Every day | ORAL | Status: DC
  Filled 2014-03-23: qty 1

## 2014-03-23 MED ORDER — OXYCODONE-ACETAMINOPHEN 5-325 MG PO TABS
2.0000 | ORAL_TABLET | ORAL | Status: DC | PRN
  Administered 2014-03-23: 2 via ORAL
  Filled 2014-03-23: qty 2

## 2014-03-23 NOTE — MAU Note (Signed)
Patient states she started having lower abdominal pain since 0700 this am. States she has felt fetal movement. Has a slight increase in vaginal discharge, no bleeding.

## 2014-03-23 NOTE — MAU Provider Note (Signed)
History     CSN: [id number]  Arrival date and time: 03/23/14 [id number]   First Provider Initiated Contact with Patient 03/23/14 1033      Chief Complaint  Patient presents with  . Abdominal Pain   HPI This is a 23 y.o. female at 1059w0d who presents with lower abdominal cramping and tightening since 7am.  No bleeding.  Was recently admitted with subchorionic hemorrhage last week.   RN Note:  Patient states she started having lower abdominal pain since 0700 this am. States she has felt fetal movement. Has a slight increase in vaginal discharge, no bleeding.        OB History   Grav Para Term Preterm Abortions TAB SAB Ect Mult Living   1         0      Past Medical History  Diagnosis Date  . Medical history non-contributory     Past Surgical History  Procedure Laterality Date  . No past surgeries      Family History  Problem Relation Age of Onset  . Arthritis Neg Hx   . Asthma Neg Hx   . Birth defects Neg Hx   . Depression Neg Hx   . Drug abuse Neg Hx   . Early death Neg Hx   . Hearing loss Neg Hx   . Heart disease Neg Hx   . Hyperlipidemia Neg Hx   . Hypertension Neg Hx   . Kidney disease Neg Hx   . Learning disabilities Neg Hx   . Mental illness Neg Hx   . Mental retardation Neg Hx   . Miscarriages / Stillbirths Neg Hx   . Stroke Neg Hx   . Vision loss Neg Hx   . Varicose Veins Neg Hx   . Diabetes Paternal Aunt   . Alcohol abuse Maternal Grandfather   . COPD Maternal Grandfather   . Diabetes Paternal Grandmother     History  Substance Use Topics  . Smoking status: Never Smoker   . Smokeless tobacco: Not on file  . Alcohol Use: No    Allergies: No Known Allergies  Prescriptions prior to admission  Medication Sig Dispense Refill  . docusate sodium (COLACE) 100 MG capsule Take 1 capsule (100 mg total) by mouth daily.  30 capsule  11  . Prenatal Vit-Fe Fumarate-FA (PNV PRENATAL PLUS MULTIVITAMIN) 27-1 MG TABS Take 1 tablet by mouth daily before  breakfast.  90 tablet  3    Review of Systems  Constitutional: Negative for fever, chills and malaise/fatigue.  Gastrointestinal: Positive for abdominal pain. Negative for nausea, vomiting, diarrhea and constipation.  Genitourinary: Negative for dysuria.  Neurological: Negative for dizziness and headaches.   Physical Exam   Blood pressure 141/80, pulse 92, temperature 98.3 F (36.8 C), temperature source Oral, resp. rate 18, height 5\' 1"  (1.549 m), weight 103.42 kg (228 lb), last menstrual period 10/12/2013, SpO2 100.00%.  Physical Exam  Constitutional: She is oriented to person, place, and time. She appears well-developed and well-nourished. No distress.  HENT:  Head: Normocephalic.  Cardiovascular: Normal rate.   Respiratory: Effort normal.  GI: Soft. She exhibits no distension. There is tenderness (suprapubic). There is no rebound and no guarding.  Genitourinary: Vagina normal and uterus normal. No vaginal discharge found.  Fetus protruding into vagina, though cervix is still intact Cervix is 2cm/100%effaced/-1-2/probably breech  Musculoskeletal: Normal range of motion.  Neurological: She is alert and oriented to person, place, and time.  Skin: Skin is warm and dry.  Psychiatric: She has a normal mood and affect.    MAU Course  Procedures  MDM Labs pending   Assessment and Plan  A:  SIUP at [redacted]w[redacted]d       Preterm Labor with fetus protruding into vagina  P:  Discussed with DR Clearance Coots       Admit to Antenatal       Magnesium sulfate tocolysis       Trendelenberg  [facility] 03/23/2014, 10:33 AM

## 2014-03-23 NOTE — Progress Notes (Signed)
Patient ID: Tina [name], female   DOB: [birth year], 23 y.o.   MRN: [id number] Him in 8 PM patient past the fetus nonviable intact and then the placenta was removed intact from the vagina her bleeding lochia is a small amount

## 2014-03-23 NOTE — Consult Note (Signed)
MFM Note  Tina [name] is a 23 year old G1 female at 22+0 weeks who was admitted this AM for pain and a dilated cervix with bulging membranes. Tina [name] has been bleeding off and on throughout the pregnancy. On 03/15 she was hospitalized for vaginal bleeding and an US [facility]. She remained in house with bedrest and the bleeding improved. She was discharged on 03/21. This AM she presented to MAU again with pain and an increase in vaginal discharge. On exam the cervix was dilated to ~ 2 cms with membranes at the external os. Fetal small parts were felt. She was transported to the antenatal unit and placed in Trendelenburg and given a bolus of magnesium sulfate and then a maintenance dose.   The only other prenatal complication was the result of her quad screen. Her risk for T21 increased to 1 in 64 and the hCG and inhibin were very elevated, 4.27 and 10.62 MoMs respectively. After genetic counseling, she declined all other aneuploidy testing.  US showed large U-shaped funneling with the external os dilated to ~ 2 cms; membranes prolapsed down to level of external os; cephalic presentation   Afebrile; WBCs 15,000 Uterus - ? Tenderness but pt also having some contractions Currently, c/o intermittent cramping with pain score of 6/10  Assessment: 1) IUP at 22+0 weeks 2) Advanced cervical dilation with intermittent contractions 3) Chronic abruption 4) Elevated levels of hCG and inhibin  Recommendations: 1) Follow closely for intrauterine infection: temperature, exam for uterine tenderness, serial CBCs, continued contractions 2) Would wean off magnesium sulfate and try indomethacin for 48 hours 3) D/C Trendelenburg 4) Offer termination for maternal sepsis prevention (I have tonight) 5) Offer expectant management - pt wants to do this; she is aware of the risks 6) After negative amniocentesis, could offer her a rescue cerclage; (?provider to perform procedure?) 7) Give BMZ at ~ 23 weeks  Please  call with any questions or concerns or if the clinical situation changes (PPROM, etc)  (Face-to-face consultation with patient: 30 min)

## 2014-03-24 ENCOUNTER — Encounter (HOSPITAL_COMMUNITY): Payer: Self-pay | Admitting: *Deleted

## 2014-03-24 DIAGNOSIS — O039 Complete or unspecified spontaneous abortion without complication: Secondary | ICD-10-CM | POA: Diagnosis present

## 2014-03-24 LAB — CBC
HCT: 30.7 % — ABNORMAL LOW (ref 36.0–46.0)
Hemoglobin: 10.8 g/dL — ABNORMAL LOW (ref 12.0–15.0)
MCH: 33.3 pg (ref 26.0–34.0)
MCHC: 35.2 g/dL (ref 30.0–36.0)
MCV: 94.8 fL (ref 78.0–100.0)
Platelets: 236 10*3/uL (ref 150–400)
RBC: 3.24 MIL/uL — AB (ref 3.87–5.11)
RDW: 13 % (ref 11.5–15.5)
WBC: 20.4 10*3/uL — ABNORMAL HIGH (ref 4.0–10.5)

## 2014-03-24 MED ORDER — OXYCODONE-ACETAMINOPHEN 5-325 MG PO TABS: 1.0000 | ORAL_TABLET | ORAL | Status: DC | PRN

## 2014-03-24 MED ORDER — DIPHENHYDRAMINE HCL 25 MG PO CAPS
25.0000 mg | ORAL_CAPSULE | Freq: Four times a day (QID) | ORAL | Status: DC | PRN
Start: 2014-03-24 — End: 2014-03-24

## 2014-03-24 MED ORDER — SIMETHICONE 80 MG PO CHEW
80.0000 mg | CHEWABLE_TABLET | ORAL | Status: DC | PRN
Start: 2014-03-24 — End: 2014-03-24

## 2014-03-24 MED ORDER — WITCH HAZEL-GLYCERIN EX PADS: 1.0000 "application " | MEDICATED_PAD | CUTANEOUS | Status: DC | PRN

## 2014-03-24 MED ORDER — IBUPROFEN 600 MG PO TABS
600.0000 mg | ORAL_TABLET | Freq: Four times a day (QID) | ORAL | Status: DC
  Administered 2014-03-24 (×2): 600 mg via ORAL
  Filled 2014-03-24 (×2): qty 1

## 2014-03-24 MED ORDER — ZOLPIDEM TARTRATE 5 MG PO TABS: 5.0000 mg | ORAL_TABLET | Freq: Every evening | ORAL | Status: DC | PRN

## 2014-03-24 MED ORDER — DIBUCAINE 1 % RE OINT: 1.0000 "application " | TOPICAL_OINTMENT | RECTAL | Status: DC | PRN

## 2014-03-24 MED ORDER — SENNOSIDES-DOCUSATE SODIUM 8.6-50 MG PO TABS
2.0000 | ORAL_TABLET | ORAL | Status: DC
  Administered 2014-03-24: 2 via ORAL
  Filled 2014-03-24: qty 2

## 2014-03-24 MED ORDER — BENZOCAINE-MENTHOL 20-0.5 % EX AERO: 1.0000 "application " | INHALATION_SPRAY | CUTANEOUS | Status: DC | PRN

## 2014-03-24 MED ORDER — IBUPROFEN 600 MG PO TABS: 600.0000 mg | ORAL_TABLET | Freq: Four times a day (QID) | ORAL | Status: DC

## 2014-03-24 MED ORDER — FERROUS SULFATE 325 (65 FE) MG PO TABS
325.0000 mg | ORAL_TABLET | Freq: Two times a day (BID) | ORAL | Status: DC
  Administered 2014-03-24: 325 mg via ORAL
  Filled 2014-03-24: qty 1

## 2014-03-24 MED ORDER — TETANUS-DIPHTH-ACELL PERTUSSIS 5-2.5-18.5 LF-MCG/0.5 IM SUSP: 0.5000 mL | Freq: Once | INTRAMUSCULAR | Status: DC

## 2014-03-24 MED ORDER — PRENATAL MULTIVITAMIN CH
1.0000 | ORAL_TABLET | Freq: Every day | ORAL | Status: DC
  Administered 2014-03-24: 1 via ORAL
  Filled 2014-03-24: qty 1

## 2014-03-24 MED ORDER — ONDANSETRON HCL 4 MG/2ML IJ SOLN
4.0000 mg | INTRAMUSCULAR | Status: DC | PRN
Start: 2014-03-24 — End: 2014-03-24

## 2014-03-24 MED ORDER — LANOLIN HYDROUS EX OINT: TOPICAL_OINTMENT | CUTANEOUS | Status: DC | PRN

## 2014-03-24 MED ORDER — ONDANSETRON HCL 4 MG PO TABS: 4.0000 mg | ORAL_TABLET | ORAL | Status: DC | PRN

## 2014-03-24 NOTE — Progress Notes (Signed)
Pt. Is discharged in the care of friend with  N.T> escort. No equipment needed for home use. Denies any pain or discomfort,Emotional support due to lost.  Pt. Was able to verbalize fears about lost.Discharged instructions with Rx were given to pt. Questions were asked AND ANSWERED.

## 2014-03-24 NOTE — Progress Notes (Signed)
TC to Dr Gaynell FaceMarshall for request of pain med. Order for po pain med given and order to increase magnesium sulfate to 3 gm/hr x 3hrs then back down to 2 gms per hour.

## 2014-03-24 NOTE — Progress Notes (Signed)
I spoke with Hazyl about the grief process and helped her think through how to get her own needs met.  We also addressed incongruent grief, as her boyfriend is coping very differently.  She received resources for on-going support including Heart Strings and Comfort Program information.  [employer] Pager, [phone] 1:54 PM   03/24/14 1300  Clinical Encounter Type  Visited With Patient  Visit Type Spiritual support  Spiritual Encounters  Spiritual Needs Grief support

## 2014-03-24 NOTE — Progress Notes (Signed)
Post Partum Day 1 Subjective: no complaints  Objective: Blood pressure 119/70, pulse 81, temperature 97.5 F (36.4 C), temperature source Oral, resp. rate 16, height 5\' 1"  (1.549 m), weight 228 lb (103.42 kg), last menstrual period 10/12/2013, SpO2 100.00%, unknown if currently breastfeeding.  Physical Exam:  General: alert and no distress Lochia: appropriate Uterine Fundus: firm Incision: None DVT Evaluation: No evidence of DVT seen on physical exam.   Recent Labs  03/23/14 1039 03/24/14 0509  HGB 11.1* 10.8*  HCT 32.9* 30.7*    Assessment/Plan: Plan for discharge tomorrow   LOS: 1 day   HARPER,CHARLES A 03/24/2014, 8:30 AM

## 2014-03-24 NOTE — Plan of Care (Signed)
Problem: Consults Goal: Birthing Suites Patient Information Press F2 to bring up selections list  Outcome: Not Applicable Date Met:  99/77/41  Not applicable.  No Birthing Suites Admission.

## 2014-03-24 NOTE — H&P (Signed)
Tina [name] is a 23 y.o. female presenting for UC's. Maternal Medical History:  Reason for admission: Contractions.  23 yo G1 P1.  Presented at 22 weeks to triage with UC's.  On exam she was 2 cm dilated and was was started on magnesium sulfate tocolysis.  She progressed to NSVD of a nonviable fetus.   Prenatal complications: Bleeding.   Prenatal Complications - Diabetes: none.    OB History   Grav Para Term Preterm Abortions TAB SAB Ect Mult Living   1 1  1       0     Past Medical History  Diagnosis Date  . Medical history non-contributory    Past Surgical History  Procedure Laterality Date  . No past surgeries     Family History: family history includes Alcohol abuse in her maternal grandfather; COPD in her maternal grandfather; Diabetes in her paternal aunt and paternal grandmother. There is no history of Arthritis, Asthma, Birth defects, Depression, Drug abuse, Early death, Hearing loss, Heart disease, Hyperlipidemia, Hypertension, Kidney disease, Learning disabilities, Mental illness, Mental retardation, Miscarriages / Stillbirths, Stroke, Vision loss, or Varicose Veins. Social History:  reports that she has never smoked. She does not have any smokeless tobacco history on file. She reports that she does not drink alcohol or use illicit drugs.   Prenatal Transfer Tool  Maternal Diabetes: No Genetic Screening: Abnormal:  Results: Elevated risk of Trisomy 21 Maternal Ultrasounds/Referrals: Subchorionic hemorrhage. Fetal Ultrasounds or other Referrals:  [facility]  Abnormal HCG and Inhibin on Quad Screen with elevated risk for Trisomy 21. Maternal Substance Abuse:  No Significant Maternal Medications:  None Significant Maternal Lab Results:  Abnormal Quad Screen. Other Comments:  None  Review of Systems  All other systems reviewed and are negative.      Exam Physical Exam  Nursing note and vitals reviewed. Constitutional: She appears  well-developed and well-nourished.  HENT:  Head: Normocephalic and atraumatic.  Eyes: Conjunctivae are normal. Pupils are equal, round, and reactive to light.  Neck: Normal range of motion. Neck supple.  Cardiovascular: Normal rate and regular rhythm.   Respiratory: Effort normal and breath sounds normal.  GI: Soft.  Skin: Skin is warm and dry.  Psychiatric: She has a normal mood and affect. Her behavior is normal. Judgment and thought content normal.    Prenatal labs: ABO, Rh: --/--/O POS (03/26 1137) Antibody: NEG (03/26 1137) Rubella: 0.87 (12/30 1453) RPR: NON REAC (12/30 1453)  HBsAg: NEGATIVE (12/30 1453)  HIV: NON REACTIVE (12/30 1453)  GBS:     Assessment/Plan: PPD#1 S/P NSVD nonviable 22 week infant.  Doing well.  Routine postpartum care.   Tina [name] A 03/24/2014, 7:53 AM

## 2014-03-24 NOTE — Discharge Summary (Signed)
Obstetric Discharge Summary Reason for Admission: onset of labor Prenatal Procedures: ultrasound Intrapartum Procedures: spontaneous vaginal delivery Postpartum Procedures: none Complications-Operative and Postpartum: none Hemoglobin  Date Value Ref Range Status  03/24/2014 10.8* 12.0 - 15.0 g/dL Final     HCT  Date Value Ref Range Status  03/24/2014 30.7* 36.0 - 46.0 % Final    Physical Exam:  General: alert and no distress Lochia: appropriate Uterine Fundus: firm Incision: None DVT Evaluation: No evidence of DVT seen on physical exam.  Discharge Diagnoses: Antepartum bleeding, Premature labor and Preterm delivery.  Discharge Information: Date: 03/24/2014 Activity: pelvic rest Diet: routine Medications: PNV, Ibuprofen, Colace and Percocet Condition: stable Instructions: refer to practice specific booklet Discharge to: home Follow-up Information   Follow up with Tina Imm A, MD. Schedule an appointment as soon as possible for Tina [name] visit in 2 weeks.   Specialty:  Obstetrics and Gynecology   Contact information:   [address] [place] [address] [phone]       Newborn Data: Live born unspecified sex  Birth Weight: 10 oz (283 g) APGAR: 0, 0 .  Tina Tina [name] 03/24/2014, 9:14 AM

## 2014-03-24 NOTE — Progress Notes (Signed)
I spent time with Tina [name] and her family for initial grief support.  They were very appreciative of support.  They have good family support as well and baby was still with them in the room.  I will follow up with them to make sure they have resources for on-going support after discharge.  [address]Chaplain Katy [place] Pager, [phone] 11:52 AM   03/24/14 1100  Clinical Encounter Type  Visited With Patient and family together  Visit Type Spiritual support  Referral From Nurse  Spiritual Encounters  Spiritual Needs Grief support  Stress Factors  Patient Stress Factors Loss

## 2014-03-29 ENCOUNTER — Encounter: Payer: Self-pay | Admitting: Obstetrics

## 2014-03-29 ENCOUNTER — Ambulatory Visit (HOSPITAL_COMMUNITY): Payer: Medicaid Other

## 2014-03-30 ENCOUNTER — Other Ambulatory Visit: Payer: Medicaid Other

## 2014-03-30 ENCOUNTER — Encounter: Payer: Medicaid Other | Admitting: Obstetrics

## 2014-04-10 ENCOUNTER — Ambulatory Visit (INDEPENDENT_AMBULATORY_CARE_PROVIDER_SITE_OTHER): Payer: Medicaid Other | Admitting: Obstetrics

## 2014-04-10 ENCOUNTER — Encounter: Payer: Medicaid Other | Admitting: Obstetrics

## 2014-04-10 ENCOUNTER — Encounter: Payer: Self-pay | Admitting: Obstetrics

## 2014-04-10 NOTE — Progress Notes (Signed)
Subjective:     Tina [name] is a 23 y.o. female who presents for a postpartum visit. She is 2 weeks postpartum following a SVD of nonviable fetus. I have fully reviewed the prenatal and intrapartum course. The delivery was at 22 gestational weeks. Outcome: spontaneous vaginal delivery. Anesthesia: none. Postpartum course has been adjusting as best as she can.  Bleeding thin lochia. Bowel function is normal. Bladder function is normal. Patient is not sexually active. Contraception method is none. Pt states that she would like to have another baby and would like to discuss how long to wait.  Postpartum depression screening: negative.  The following portions of the patient's history were reviewed and updated as appropriate: allergies, current medications, past family history, past medical history, past social history, past surgical history and problem list.  Review of Systems Pertinent items are noted in HPI.   Objective:    LMP 10/12/2013  General:  alert and no distress Breasts:  Negative Abdomen:  Soft, NT. Pelvic:  NEFG.  Uterus NSSC.  Nontender   Assessment:     Normal postpartum exam. Pap smear not done at today's visit.   H/O SAB at 22 weeks because of probable cervical insufficiency.  Plan:    1. Contraception: abstinence 2. Consider Cervical Cerclage next pregnancy. 3. Follow up in: several months or as needed.

## 2014-05-02 ENCOUNTER — Ambulatory Visit (HOSPITAL_COMMUNITY): Payer: Medicaid Other

## 2014-07-01 ENCOUNTER — Inpatient Hospital Stay (HOSPITAL_COMMUNITY)
Admission: AD | Admit: 2014-07-01 | Discharge: 2014-07-01 | Disposition: A | Discharge status: Home / Self Care | Payer: Medicaid Other | Source: Ambulatory Visit | Attending: Obstetrics | Admitting: Obstetrics

## 2014-07-01 ENCOUNTER — Encounter (HOSPITAL_COMMUNITY): Payer: Self-pay | Admitting: Family

## 2014-07-01 DIAGNOSIS — M545 Low back pain, unspecified: Secondary | ICD-10-CM | POA: Insufficient documentation

## 2014-07-01 DIAGNOSIS — R109 Unspecified abdominal pain: Secondary | ICD-10-CM | POA: Insufficient documentation

## 2014-07-01 DIAGNOSIS — O209 Hemorrhage in early pregnancy, unspecified: Secondary | ICD-10-CM

## 2014-07-01 DIAGNOSIS — O4690 Antepartum hemorrhage, unspecified, unspecified trimester: Secondary | ICD-10-CM

## 2014-07-01 LAB — URINALYSIS, ROUTINE W REFLEX MICROSCOPIC
BILIRUBIN URINE: NEGATIVE
Glucose, UA: NEGATIVE mg/dL
KETONES UR: NEGATIVE mg/dL
Leukocytes, UA: NEGATIVE
Nitrite: NEGATIVE
PROTEIN: NEGATIVE mg/dL
Specific Gravity, Urine: 1.025 (ref 1.005–1.030)
UROBILINOGEN UA: 0.2 mg/dL (ref 0.0–1.0)
pH: 6 (ref 5.0–8.0)

## 2014-07-01 LAB — CBC
HCT: 39.4 % (ref 36.0–46.0)
Hemoglobin: 13.6 g/dL (ref 12.0–15.0)
MCH: 31 pg (ref 26.0–34.0)
MCHC: 34.5 g/dL (ref 30.0–36.0)
MCV: 89.7 fL (ref 78.0–100.0)
Platelets: 259 10*3/uL (ref 150–400)
RBC: 4.39 MIL/uL (ref 3.87–5.11)
RDW: 13.3 % (ref 11.5–15.5)
WBC: 9.2 10*3/uL (ref 4.0–10.5)

## 2014-07-01 LAB — URINE MICROSCOPIC-ADD ON

## 2014-07-01 LAB — POCT PREGNANCY, URINE: Preg Test, Ur: NEGATIVE

## 2014-07-01 LAB — HCG, QUANTITATIVE, PREGNANCY: hCG, Beta Chain, Quant, S: 20 m[IU]/mL — ABNORMAL HIGH (ref <5)

## 2014-07-01 NOTE — Discharge Instructions (Signed)
Vaginal Bleeding During Pregnancy, First Trimester  A small amount of bleeding (spotting) from the vagina is relatively common in early pregnancy. It usually stops on its own. Various things may cause bleeding or spotting in early pregnancy. Some bleeding may be related to the pregnancy, and some may not. In most cases, the bleeding is normal and is not a problem. However, bleeding can also be a sign of something serious. Be sure to tell your health care provider about any vaginal bleeding right away.  Some possible causes of vaginal bleeding during the first trimester include:  · Infection or inflammation of the cervix.  · Growths (polyps) on the cervix.  · Miscarriage or threatened miscarriage.  · Pregnancy tissue has developed outside of the uterus and in a fallopian tube (tubal pregnancy).  · Tiny cysts have developed in the uterus instead of pregnancy tissue (molar pregnancy).  HOME CARE INSTRUCTIONS   Watch your condition for any changes. The following actions may help to lessen any discomfort you are feeling:  · Follow your health care provider's instructions for limiting your activity. If your health care provider orders bed rest, you may need to stay in bed and only get up to use the bathroom. However, your health care provider may allow you to continue light activity.  · If needed, make plans for someone to help with your regular activities and responsibilities while you are on bed rest.  · Keep track of the number of pads you use each day, how often you change pads, and how soaked (saturated) they are. Write this down.  · Do not use tampons. Do not douche.  · Do not have sexual intercourse or orgasms until approved by your health care provider.  · If you pass any tissue from your vagina, save the tissue so you can show it to your health care provider.  · Only take over-the-counter or prescription medicines as directed by your health care provider.  · Do not take aspirin because it can make you  bleed.  · Keep all follow-up appointments as directed by your health care provider.  SEEK MEDICAL CARE IF:  · You have any vaginal bleeding during any part of your pregnancy.  · You have cramps or labor pains.  · You have a fever, not controlled by medicine.  SEEK IMMEDIATE MEDICAL CARE IF:   · You have severe cramps in your back or belly (abdomen).  · You pass large clots or tissue from your vagina.  · Your bleeding increases.  · You feel light-headed or weak, or you have fainting episodes.  · You have chills.  · You are leaking fluid or have a gush of fluid from your vagina.  · You pass out while having a bowel movement.  MAKE SURE YOU:  · Understand these instructions.  · Will watch your condition.  · Will get help right away if you are not doing well or get worse.  Document Released: 09/24/2005 Document Revised: 12/20/2013 Document Reviewed: 08/22/2013  ExitCare® Patient Information ©2015 [employer]. This information is not intended to replace advice given to you by your health care provider. Make sure you discuss any questions you have with your health care provider.

## 2014-07-01 NOTE — MAU Provider Note (Signed)
History     CSN: [id number]  Arrival date and time: 07/01/14 1605   First Provider Initiated Contact with Patient 07/01/14 1655      Chief Complaint  Patient presents with  . Vaginal Bleeding   HPI Tina [name] is a 23 y.o. G1 P0100. LMP 5/24= [redacted] wk EGA. She has had 2 + home UPT's. She started pink spotting with wiping 3 d ago, now is having dark red bleeding- nto heavy, occ pea sized clots. She has low abd and back pain. No other changes. She had 22 wk demise 3/15, probable incompetent cervix. Not planning preg but not using contraception.   OB History   Grav Para Term Preterm Abortions TAB SAB Ect Mult Living   1 1  1       0      Past Medical History  Diagnosis Date  . Medical history non-contributory     Past Surgical History  Procedure Laterality Date  . No past surgeries      Family History  Problem Relation Age of Onset  . Arthritis Neg Hx   . Asthma Neg Hx   . Birth defects Neg Hx   . Depression Neg Hx   . Drug abuse Neg Hx   . Early death Neg Hx   . Hearing loss Neg Hx   . Heart disease Neg Hx   . Hyperlipidemia Neg Hx   . Hypertension Neg Hx   . Kidney disease Neg Hx   . Learning disabilities Neg Hx   . Mental illness Neg Hx   . Mental retardation Neg Hx   . Miscarriages / Stillbirths Neg Hx   . Stroke Neg Hx   . Vision loss Neg Hx   . Varicose Veins Neg Hx   . Diabetes Paternal Aunt   . Alcohol abuse Maternal Grandfather   . COPD Maternal Grandfather   . Diabetes Paternal Grandmother     History  Substance Use Topics  . Smoking status: Never Smoker   . Smokeless tobacco: Not on file  . Alcohol Use: No    Allergies: No Known Allergies  Prescriptions prior to admission  Medication Sig Dispense Refill  . Docusate Sodium (COLACE PO) Take 1 each by mouth daily as needed (for constipation.).      Marland Kitchen. Prenatal Vit-Fe Fumarate-FA (PRENATAL MULTIVITAMIN) TABS tablet Take 1 tablet by mouth daily.        Review of Systems  Constitutional:  Negative for fever and chills.  Gastrointestinal: Positive for nausea and abdominal pain.  Genitourinary: Negative for dysuria, urgency and frequency.       Bleeding, cramping   Physical Exam   Blood pressure 128/90, pulse 89, temperature 97.9 F (36.6 C), temperature source Oral, resp. rate 16, unknown if currently breastfeeding.  Physical Exam  Nursing note and vitals reviewed. Constitutional: She is oriented to person, place, and time. She appears well-developed and well-nourished.  GI: Soft. There is no tenderness.  Genitourinary:  Pelvic exam- Ext gen- nl anatomy,skin intact Vagina- scant dark blood Cx- closed Uterus- upper nl size Adn- non tender, no masses palp  Musculoskeletal: Normal range of motion.  Neurological: She is alert and oriented to person, place, and time.  Skin: Skin is warm and dry.  Psychiatric: She has a normal mood and affect. Her behavior is normal.    MAU Course  Procedures  MDM Results for orders placed during the hospital encounter of 07/01/14 (from the past 24 hour(s))  URINALYSIS, ROUTINE W REFLEX MICROSCOPIC  Status: Abnormal   Collection Time    07/01/14  4:24 PM      Result Value Ref Range   Color, Urine YELLOW  YELLOW   APPearance CLEAR  CLEAR   Specific Gravity, Urine 1.025  1.005 - 1.030   pH 6.0  5.0 - 8.0   Glucose, UA NEGATIVE  NEGATIVE mg/dL   Hgb urine dipstick LARGE (*) NEGATIVE   Bilirubin Urine NEGATIVE  NEGATIVE   Ketones, ur NEGATIVE  NEGATIVE mg/dL   Protein, ur NEGATIVE  NEGATIVE mg/dL   Urobilinogen, UA 0.2  0.0 - 1.0 mg/dL   Nitrite NEGATIVE  NEGATIVE   Leukocytes, UA NEGATIVE  NEGATIVE  URINE MICROSCOPIC-ADD ON     Status: Abnormal   Collection Time    07/01/14  4:24 PM      Result Value Ref Range   Squamous Epithelial / LPF FEW (*) RARE   WBC, UA 0-2  <3 WBC/hpf   RBC / HPF 3-6  <3 RBC/hpf   Urine-Other MUCOUS PRESENT    POCT PREGNANCY, URINE     Status: None   Collection Time    07/01/14  4:32 PM       Result Value Ref Range   Preg Test, Ur NEGATIVE  NEGATIVE  HCG, QUANTITATIVE, PREGNANCY     Status: Abnormal   Collection Time    07/01/14  5:00 PM      Result Value Ref Range   hCG, Beta Chain, Quant, S 20 (*) <5 mIU/mL  CBC     Status: None   Collection Time    07/01/14  5:00 PM      Result Value Ref Range   WBC 9.2  4.0 - 10.5 K/uL   RBC 4.39  3.87 - 5.11 MIL/uL   Hemoglobin 13.6  12.0 - 15.0 g/dL   HCT 78.439.4  69.636.0 - 29.546.0 %   MCV 89.7  78.0 - 100.0 fL   MCH 31.0  26.0 - 34.0 pg   MCHC 34.5  30.0 - 36.0 g/dL   RDW 28.413.3  13.211.5 - 44.015.5 %   Platelets 259  150 - 400 K/uL     Assessment and Plan  Bleeding in pregnancy BHCG 20-probable miscarriage in progress Ectopic precautions reviewed Return 48 hr repeat BHCG Plan of care reviewed with the pt  Tina Barua M. 07/01/2014, 5:13 PM

## 2014-07-01 NOTE — MAU Note (Signed)
23 yo, G1P0, LMP 05/21/14, with 2 +HPT, reports VB x last 3 days. Reports it was at first lighter, now darker bleeding with wiping only today and small clots in toilet.  She has experienced low back pain and abdominal cramping today. In March, she experienced a loss at 22 weeks.

## 2014-07-05 ENCOUNTER — Inpatient Hospital Stay (HOSPITAL_COMMUNITY)
Admission: AD | Admit: 2014-07-05 | Discharge: 2014-07-05 | Disposition: A | Discharge status: Home / Self Care | Payer: Medicaid Other | Source: Ambulatory Visit | Attending: Obstetrics & Gynecology | Admitting: Obstetrics & Gynecology

## 2014-07-05 DIAGNOSIS — O039 Complete or unspecified spontaneous abortion without complication: Secondary | ICD-10-CM

## 2014-07-05 DIAGNOSIS — O26859 Spotting complicating pregnancy, unspecified trimester: Secondary | ICD-10-CM | POA: Diagnosis present

## 2014-07-05 LAB — HCG, QUANTITATIVE, PREGNANCY: hCG, Beta Chain, Quant, S: 17 m[IU]/mL — ABNORMAL HIGH (ref <5)

## 2014-07-05 NOTE — MAU Note (Signed)
Patient to MAU for f/u BHCG. States she has bleeding on and off and occasional cramping.

## 2014-07-05 NOTE — MAU Provider Note (Signed)
[name] is a 23 y.o. G1P0100 at Unknown who presents to MAU today for follow-up labs. The patient was seen on 07/01/14 and had quant hCG of 20. She had complaint of spotting and cramping at that time. Patient states continues light spotting and very mild cramping today.   BP 108/67  Pulse 70  Temp(Src) 98.4 F (36.9 C) (Oral)  Resp 16  SpO2 100% GENERAL: Well-developed, well-nourished female in no acute distress.  HEENT: Normocephalic, atraumatic.   LUNGS: Effort normal HEART: Regular rate  SKIN: Warm, dry and without erythema PSYCH: Normal mood and affect  Results for Tina NoraFEMAT, Angeletta (MRN [id number]) as of 07/05/2014 09:15  Ref. Range 07/01/2014 17:00 07/05/2014 08:35  hCG, Beta Chain, Quant, S Latest Range: <5 mIU/mL 20 (H) 17 (H)    A: SAB  P: Discharge home Bleeding precautions discussed Patient advised to call [facility] for a follow-up appointment in 1-2 weeks Patient may return to MAU as needed or if her condition were to change or worsen  Freddi StarrJulie N Ethier, PA-C 07/05/2014 9:16 AM

## 2014-07-05 NOTE — Discharge Instructions (Signed)
Incomplete Miscarriage °A miscarriage is the sudden loss of an unborn baby (fetus) before the 20th week of pregnancy. In an incomplete miscarriage, parts of the fetus or placenta (afterbirth) remain in the body.  °Having a miscarriage can be an emotional experience. Talk with your health care provider about any questions you may have about miscarrying, the grieving process, and your future pregnancy plans. °CAUSES  °· Problems with the fetal chromosomes that make it impossible for the baby to develop normally. Problems with the baby's genes or chromosomes are most often the result of errors that occur by chance as the embryo divides and grows. The problems are not inherited from the parents. °· Infection of the cervix or uterus. °· Hormone problems. °· Problems with the cervix, such as having an incompetent cervix. This is when the tissue in the cervix is not strong enough to hold the pregnancy. °· Problems with the uterus, such as an abnormally shaped uterus, uterine fibroids, or congenital abnormalities. °· Certain medical conditions. °· Smoking, drinking alcohol, or taking illegal drugs. °· Trauma. °SYMPTOMS  °· Vaginal bleeding or spotting, with or without cramps or pain. °· Pain or cramping in the abdomen or lower back. °· Passing fluid, tissue, or blood clots from the vagina. °DIAGNOSIS  °Your health care provider will perform a physical exam. You may also have an ultrasound to confirm the miscarriage. Blood or urine tests may also be ordered. °TREATMENT  °· Usually, a dilation and curettage (D&C) procedure is performed. During a D&C procedure, the cervix is widened (dilated) and any remaining fetal or placental tissue is gently removed from the uterus. °· Antibiotic medicines are prescribed if there is an infection. Other medicines may be given to reduce the size of the uterus (contract) if there is a lot of bleeding. °· If you have Rh negative blood and your baby was Rh positive, you will need an Rho(D)  immune globulin shot. This shot will protect any future baby from having Rh blood problems in future pregnancies. °· You may be confined to bed rest. This means you should stay in bed and only get up to use the bathroom. °HOME CARE INSTRUCTIONS  °· Rest as directed by your health care provider. °· Restrict activity as directed by your health care provider. You may be allowed to continue light activity if curettage was not done but you require further treatment. °· Keep track of the number of pads you use each day. Keep track of how soaked (saturated) they are. Record this information. °· Do not  use tampons. °· Do not douche or have sexual intercourse until approved by your health care provider. °· Keep all follow-up appointments for re-evaluation and continuing management. °· Only take over-the-counter or prescription medicines for pain, fever, or discomfort as directed by your health care provider. °· Take antibiotic medicine as directed by your health care provider. Make sure you finish it even if you start to feel better. °SEEK IMMEDIATE MEDICAL CARE IF:  °· You experience severe cramps in your stomach, back, or abdomen. °· You have an unexplained temperature (make sure to record these temperatures). °· You pass large clots or tissue (save these for your health care provider to inspect). °· Your bleeding increases. °· You become light-headed, weak, or have fainting episodes. °MAKE SURE YOU:  °· Understand these instructions. °· Will watch your condition. °· Will get help right away if you are not doing well or get worse. °Document Released: 12/15/2005 Document Revised: 10/05/2013 Document Reviewed: 07/14/2013 °  Exit[employer]® Patient Information ©2015 [employer]. This information is not intended to replace advice given to you by your health care provider. Make sure you discuss any questions you have with your health care provider. ° °

## 2014-10-30 ENCOUNTER — Encounter (HOSPITAL_COMMUNITY): Payer: Self-pay | Admitting: Family

## 2014-11-21 ENCOUNTER — Emergency Department (HOSPITAL_COMMUNITY)
Admission: EM | Admit: 2014-11-21 | Discharge: 2014-11-21 | Disposition: A | Discharge status: Home / Self Care | Payer: Medicaid Other | Attending: Emergency Medicine | Admitting: Emergency Medicine

## 2014-11-21 ENCOUNTER — Encounter (HOSPITAL_COMMUNITY): Payer: Self-pay | Admitting: *Deleted

## 2014-11-21 ENCOUNTER — Emergency Department (HOSPITAL_COMMUNITY): Payer: Medicaid Other

## 2014-11-21 DIAGNOSIS — Y92410 Unspecified street and highway as the place of occurrence of the external cause: Secondary | ICD-10-CM | POA: Insufficient documentation

## 2014-11-21 DIAGNOSIS — Z79899 Other long term (current) drug therapy: Secondary | ICD-10-CM | POA: Diagnosis not present

## 2014-11-21 DIAGNOSIS — Y9389 Activity, other specified: Secondary | ICD-10-CM | POA: Insufficient documentation

## 2014-11-21 DIAGNOSIS — Y998 Other external cause status: Secondary | ICD-10-CM | POA: Insufficient documentation

## 2014-11-21 DIAGNOSIS — S0990XA Unspecified injury of head, initial encounter: Secondary | ICD-10-CM | POA: Insufficient documentation

## 2014-11-21 MED ORDER — CYCLOBENZAPRINE HCL 10 MG PO TABS: 10.0000 mg | ORAL_TABLET | Freq: Two times a day (BID) | ORAL | Status: DC | PRN

## 2014-11-21 MED ORDER — NAPROXEN 500 MG PO TABS: 500.0000 mg | ORAL_TABLET | Freq: Two times a day (BID) | ORAL | Status: DC

## 2014-11-21 NOTE — Discharge Instructions (Signed)
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
## 2014-11-21 NOTE — ED Notes (Addendum)
MD at bedside. Pt tearful.
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
## 2014-12-07 ENCOUNTER — Inpatient Hospital Stay (HOSPITAL_COMMUNITY): Payer: Medicaid Other

## 2014-12-07 ENCOUNTER — Encounter (HOSPITAL_COMMUNITY): Payer: Self-pay | Admitting: *Deleted

## 2014-12-07 ENCOUNTER — Inpatient Hospital Stay (HOSPITAL_COMMUNITY)
Admission: AD | Admit: 2014-12-07 | Discharge: 2014-12-07 | Disposition: A | Discharge status: Home / Self Care | Payer: Medicaid Other | Source: Ambulatory Visit | Attending: Obstetrics & Gynecology | Admitting: Obstetrics & Gynecology

## 2014-12-07 DIAGNOSIS — R1032 Left lower quadrant pain: Secondary | ICD-10-CM | POA: Diagnosis present

## 2014-12-07 DIAGNOSIS — Z3A01 Less than 8 weeks gestation of pregnancy: Secondary | ICD-10-CM | POA: Insufficient documentation

## 2014-12-07 DIAGNOSIS — R109 Unspecified abdominal pain: Secondary | ICD-10-CM

## 2014-12-07 DIAGNOSIS — O9989 Other specified diseases and conditions complicating pregnancy, childbirth and the puerperium: Secondary | ICD-10-CM | POA: Diagnosis not present

## 2014-12-07 DIAGNOSIS — O26899 Other specified pregnancy related conditions, unspecified trimester: Secondary | ICD-10-CM

## 2014-12-07 LAB — CBC
HCT: 35.3 % — ABNORMAL LOW (ref 36.0–46.0)
HEMOGLOBIN: 12.3 g/dL (ref 12.0–15.0)
MCH: 32 pg (ref 26.0–34.0)
MCHC: 34.8 g/dL (ref 30.0–36.0)
MCV: 91.9 fL (ref 78.0–100.0)
Platelets: 235 10*3/uL (ref 150–400)
RBC: 3.84 MIL/uL — ABNORMAL LOW (ref 3.87–5.11)
RDW: 13.5 % (ref 11.5–15.5)
WBC: 9.2 10*3/uL (ref 4.0–10.5)

## 2014-12-07 LAB — OB RESULTS CONSOLE RPR: RPR: NONREACTIVE

## 2014-12-07 LAB — URINALYSIS, ROUTINE W REFLEX MICROSCOPIC
BILIRUBIN URINE: NEGATIVE
GLUCOSE, UA: NEGATIVE mg/dL
Hgb urine dipstick: NEGATIVE
Ketones, ur: NEGATIVE mg/dL
Leukocytes, UA: NEGATIVE
NITRITE: NEGATIVE
Protein, ur: NEGATIVE mg/dL
Urobilinogen, UA: 0.2 mg/dL (ref 0.0–1.0)
pH: 6 (ref 5.0–8.0)

## 2014-12-07 LAB — WET PREP, GENITAL
Clue Cells Wet Prep HPF POC: NONE SEEN
TRICH WET PREP: NONE SEEN
WBC, Wet Prep HPF POC: NONE SEEN
Yeast Wet Prep HPF POC: NONE SEEN

## 2014-12-07 LAB — HCG, QUANTITATIVE, PREGNANCY: hCG, Beta Chain, Quant, S: 36345 m[IU]/mL — ABNORMAL HIGH (ref <5)

## 2014-12-07 LAB — OB RESULTS CONSOLE HIV ANTIBODY (ROUTINE TESTING): HIV: NONREACTIVE

## 2014-12-07 LAB — OB RESULTS CONSOLE HEPATITIS B SURFACE ANTIGEN: Hepatitis B Surface Ag: NEGATIVE

## 2014-12-07 LAB — HIV ANTIBODY (ROUTINE TESTING W REFLEX): HIV 1&2 Ab, 4th Generation: NONREACTIVE

## 2014-12-07 LAB — POCT PREGNANCY, URINE: Preg Test, Ur: POSITIVE — AB

## 2014-12-07 LAB — OB RESULTS CONSOLE RUBELLA ANTIBODY, IGM: RUBELLA: NON-IMMUNE/NOT IMMUNE

## 2014-12-07 NOTE — Discharge Instructions (Signed)
First Trimester of Pregnancy The first trimester of pregnancy is from week 1 until the end of week 12 (months 1 through 3). A week after a sperm fertilizes an egg, the egg will implant on the wall of the uterus. This embryo will begin to develop into a baby. Genes from you and your partner are forming the baby. The female genes determine whether the baby is a boy or a girl. At 6-8 weeks, the eyes and face are formed, and the heartbeat can be seen on ultrasound. At the end of 12 weeks, all the baby's organs are formed.  Now that you are pregnant, you will want to do everything you can to have a healthy baby. Two of the most important things are to get good prenatal care and to follow your health care provider's instructions. Prenatal care is all the medical care you receive before the baby's birth. This care will help prevent, find, and treat any problems during the pregnancy and childbirth. BODY CHANGES Your body goes through many changes during pregnancy. The changes vary from woman to woman.   You may gain or lose a couple of pounds at first.  You may feel sick to your stomach (nauseous) and throw up (vomit). If the vomiting is uncontrollable, call your health care provider.  You may tire easily.  You may develop headaches that can be relieved by medicines approved by your health care provider.  You may urinate more often. Painful urination may mean you have a bladder infection.  You may develop heartburn as a result of your pregnancy.  You may develop constipation because certain hormones are causing the muscles that push waste through your intestines to slow down.  You may develop hemorrhoids or swollen, bulging veins (varicose veins).  Your breasts may begin to grow larger and become tender. Your nipples may stick out more, and the tissue that surrounds them (areola) may become darker.  Your gums may bleed and may be sensitive to brushing and flossing.  Dark spots or blotches (chloasma,  mask of pregnancy) may develop on your face. This will likely fade after the baby is born.  Your menstrual periods will stop.  You may have a loss of appetite.  You may develop cravings for certain kinds of food.  You may have changes in your emotions from day to day, such as being excited to be pregnant or being concerned that something may go wrong with the pregnancy and baby.  You may have more vivid and strange dreams.  You may have changes in your hair. These can include thickening of your hair, rapid growth, and changes in texture. Some women also have hair loss during or after pregnancy, or hair that feels dry or thin. Your hair will most likely return to normal after your baby is born. WHAT TO EXPECT AT YOUR PRENATAL VISITS During a routine prenatal visit:  You will be weighed to make sure you and the baby are growing normally.  Your blood pressure will be taken.  Your abdomen will be measured to track your baby's growth.  The fetal heartbeat will be listened to starting around week 10 or 12 of your pregnancy.  Test results from any previous visits will be discussed. Your health care provider may ask you:  How you are feeling.  If you are feeling the baby move.  If you have had any abnormal symptoms, such as leaking fluid, bleeding, severe headaches, or abdominal cramping.  If you have any questions. Other tests   that may be performed during your first trimester include:  Blood tests to find your blood type and to check for the presence of any previous infections. They will also be used to check for low iron levels (anemia) and Rh antibodies. Later in the pregnancy, blood tests for diabetes will be done along with other tests if problems develop.  Urine tests to check for infections, diabetes, or protein in the urine.  An ultrasound to confirm the proper growth and development of the baby.  An amniocentesis to check for possible genetic problems.  Fetal screens for  spina bifida and Down syndrome.  You may need other tests to make sure you and the baby are doing well. HOME CARE INSTRUCTIONS  Medicines  Follow your health care provider's instructions regarding medicine use. Specific medicines may be either safe or unsafe to take during pregnancy.  Take your prenatal vitamins as directed.  If you develop constipation, try taking a stool softener if your health care provider approves. Diet  Eat regular, well-balanced meals. Choose a variety of foods, such as meat or vegetable-based protein, fish, milk and low-fat dairy products, vegetables, fruits, and whole grain breads and cereals. Your health care provider will help you determine the amount of weight gain that is right for you.  Avoid raw meat and uncooked cheese. These carry germs that can cause birth defects in the baby.  Eating four or five small meals rather than three large meals a day may help relieve nausea and vomiting. If you start to feel nauseous, eating a few soda crackers can be helpful. Drinking liquids between meals instead of during meals also seems to help nausea and vomiting.  If you develop constipation, eat more high-fiber foods, such as fresh vegetables or fruit and whole grains. Drink enough fluids to keep your urine clear or pale yellow. Activity and Exercise  Exercise only as directed by your health care provider. Exercising will help you:  Control your weight.  Stay in shape.  Be prepared for labor and delivery.  Experiencing pain or cramping in the lower abdomen or low back is a good sign that you should stop exercising. Check with your health care provider before continuing normal exercises.  Try to avoid standing for long periods of time. Move your legs often if you must stand in one place for a long time.  Avoid heavy lifting.  Wear low-heeled shoes, and practice good posture.  You may continue to have sex unless your health care provider directs you  otherwise. Relief of Pain or Discomfort  Wear a good support bra for breast tenderness.   Take warm sitz baths to soothe any pain or discomfort caused by hemorrhoids. Use hemorrhoid cream if your health care provider approves.   Rest with your legs elevated if you have leg cramps or low back pain.  If you develop varicose veins in your legs, wear support hose. Elevate your feet for 15 minutes, 3-4 times a day. Limit salt in your diet. Prenatal Care  Schedule your prenatal visits by the twelfth week of pregnancy. They are usually scheduled monthly at first, then more often in the last 2 months before delivery.  Write down your questions. Take them to your prenatal visits.  Keep all your prenatal visits as directed by your health care provider. Safety  Wear your seat belt at all times when driving.  Make a list of emergency phone numbers, including numbers for family, friends, the hospital, and police and fire departments. General Tips    Ask your health care provider for a referral to a local prenatal education class. Begin classes no later than at the beginning of month 6 of your pregnancy.  Ask for help if you have counseling or nutritional needs during pregnancy. Your health care provider can offer advice or refer you to specialists for help with various needs.  Do not use hot tubs, steam rooms, or saunas.  Do not douche or use tampons or scented sanitary pads.  Do not cross your legs for long periods of time.  Avoid cat litter boxes and soil used by cats. These carry germs that can cause birth defects in the baby and possibly loss of the fetus by miscarriage or stillbirth.  Avoid all smoking, herbs, alcohol, and medicines not prescribed by your health care provider. Chemicals in these affect the formation and growth of the baby.  Schedule a dentist appointment. At home, brush your teeth with a soft toothbrush and be gentle when you floss. SEEK MEDICAL CARE IF:   You have  dizziness.  You have mild pelvic cramps, pelvic pressure, or nagging pain in the abdominal area.  You have persistent nausea, vomiting, or diarrhea.  You have a bad smelling vaginal discharge.  You have pain with urination.  You notice increased swelling in your face, hands, legs, or ankles. SEEK IMMEDIATE MEDICAL CARE IF:   You have a fever.  You are leaking fluid from your vagina.  You have spotting or bleeding from your vagina.  You have severe abdominal cramping or pain.  You have rapid weight gain or loss.  You vomit blood or material that looks like coffee grounds.  You are exposed to German measles and have never had them.  You are exposed to fifth disease or chickenpox.  You develop a severe headache.  You have shortness of breath.  You have any kind of trauma, such as from a fall or a car accident. Document Released: 12/09/2001 Document Revised: 05/01/2014 Document Reviewed: 10/25/2013 [employer] Patient Information 2015 [employer]. This information is not intended to replace advice given to you by your health care provider. Make sure you discuss any questions you have with your health care provider.  

## 2014-12-07 NOTE — MAU Note (Signed)
+  HPT yesterday and 2 wks ago.  Some spotting last wk.  Pain in sides/lower abd.

## 2014-12-07 NOTE — MAU Provider Note (Signed)
History     CSN: [id number]  Arrival date and time: 12/07/14 [id number]   None     Chief Complaint  Patient presents with  . Possible Pregnancy  . Abdominal Pain   HPI Ms. Noe GensFemat is a 23 yo, G4P0120, 7275w5d, presenting today for LLQ abdominal pain and spotting. The spotting occurred on two separate occasions last week and was light pink and only present on the toilet paper when she wiped. No bleeding or discharge since then. Her abdominal pain is deep to the left ASIS and is "mild", intermittent and "throbbing" and has been present for 2 weeks since she found out she was pregnant via a home pregnancy test. She also notes mild nausea, but not vomiting or diarrhea. She does have some recent increased urinary frequency but no dysuria or urgency.    Past Medical History  Diagnosis Date  . Medical history non-contributory     Past Surgical History  Procedure Laterality Date  . No past surgeries    . Extraction of wisdom      Family History  Problem Relation Age of Onset  . Arthritis Neg Hx   . Asthma Neg Hx   . Birth defects Neg Hx   . Depression Neg Hx   . Drug abuse Neg Hx   . Early death Neg Hx   . Hearing loss Neg Hx   . Heart disease Neg Hx   . Hyperlipidemia Neg Hx   . Hypertension Neg Hx   . Kidney disease Neg Hx   . Learning disabilities Neg Hx   . Mental illness Neg Hx   . Mental retardation Neg Hx   . Miscarriages / Stillbirths Neg Hx   . Stroke Neg Hx   . Vision loss Neg Hx   . Varicose Veins Neg Hx   . Diabetes Paternal Aunt   . Alcohol abuse Maternal Grandfather   . COPD Maternal Grandfather   . Diabetes Paternal Grandmother     History  Substance Use Topics  . Smoking status: Never Smoker   . Smokeless tobacco: Never Used  . Alcohol Use: No    Allergies: No Known Allergies  Prescriptions prior to admission  Medication Sig Dispense Refill Last Dose  . Prenatal Vit-Fe Fumarate-FA (PRENATAL MULTIVITAMIN) TABS tablet Take 1 tablet by mouth daily.    12/07/2014 at Unknown time  . cyclobenzaprine (FLEXERIL) 10 MG tablet Take 1 tablet (10 mg total) by mouth 2 (two) times daily as needed for muscle spasms. (Patient not taking: Reported on 12/07/2014) 20 tablet 0   . naproxen (NAPROSYN) 500 MG tablet Take 1 tablet (500 mg total) by mouth 2 (two) times daily. (Patient not taking: Reported on 12/07/2014) 30 tablet 0     Review of Systems  Constitutional: Negative for fever and chills.  Respiratory: Negative for cough.   Gastrointestinal: Positive for nausea, abdominal pain and constipation (mild intermittent). Negative for vomiting and diarrhea.  Genitourinary: Positive for frequency. Negative for dysuria, urgency and flank pain.   Physical Exam   Blood pressure 123/61, pulse 82, temperature 99.2 F (37.3 C), temperature source Oral, resp. rate 16, height 5' (1.524 m), weight 85.73 kg (189 lb), last menstrual period 10/14/2014, unknown if currently breastfeeding.  Physical Exam  Constitutional: She is oriented to person, place, and time. She appears well-developed and well-nourished.  Cardiovascular: Normal rate, regular rhythm and normal heart sounds.   Respiratory: Effort normal and breath sounds normal. No respiratory distress.  GI: Soft. She exhibits no distension and  no mass. There is tenderness (mild no palpating around the ASIS, no CVA tenderness). There is no rebound and no guarding.  Neurological: She is alert and oriented to person, place, and time.  Skin: Skin is warm and dry.  Psychiatric: She has a normal mood and affect. Her behavior is normal. Thought content normal.     Results for orders placed or performed during the hospital encounter of 12/07/14 (from the past 24 hour(s))  Urinalysis, Routine w reflex microscopic     Status: Abnormal   Collection Time: 12/07/14  9:49 AM  Result Value Ref Range   Color, Urine YELLOW YELLOW   APPearance CLEAR CLEAR   Specific Gravity, Urine >1.030 (H) 1.005 - 1.030   pH 6.0 5.0 - 8.0    Glucose, UA NEGATIVE NEGATIVE mg/dL   Hgb urine dipstick NEGATIVE NEGATIVE   Bilirubin Urine NEGATIVE NEGATIVE   Ketones, ur NEGATIVE NEGATIVE mg/dL   Protein, ur NEGATIVE NEGATIVE mg/dL   Urobilinogen, UA 0.2 0.0 - 1.0 mg/dL   Nitrite NEGATIVE NEGATIVE   Leukocytes, UA NEGATIVE NEGATIVE  Pregnancy, urine POC     Status: Abnormal   Collection Time: 12/07/14  9:52 AM  Result Value Ref Range   Preg Test, Ur POSITIVE (A) NEGATIVE  CBC     Status: Abnormal   Collection Time: 12/07/14 10:09 AM  Result Value Ref Range   WBC 9.2 4.0 - 10.5 K/uL   RBC 3.84 (L) 3.87 - 5.11 MIL/uL   Hemoglobin 12.3 12.0 - 15.0 g/dL   HCT 16.135.3 (L) 09.636.0 - 04.546.0 %   MCV 91.9 78.0 - 100.0 fL   MCH 32.0 26.0 - 34.0 pg   MCHC 34.8 30.0 - 36.0 g/dL   RDW 40.913.5 81.111.5 - 91.415.5 %   Platelets 235 150 - 400 K/uL  hCG, quantitative, pregnancy     Status: Abnormal   Collection Time: 12/07/14 10:09 AM  Result Value Ref Range   hCG, Beta Chain, Quant, S 36345 (H) <5 mIU/mL  Wet prep, genital     Status: None   Collection Time: 12/07/14 10:40 AM  Result Value Ref Range   Yeast Wet Prep HPF POC NONE SEEN NONE SEEN   Trich, Wet Prep NONE SEEN NONE SEEN   Clue Cells Wet Prep HPF POC NONE SEEN NONE SEEN   WBC, Wet Prep HPF POC NONE SEEN NONE SEEN    Pelvic exam: Cervix pink, visually closed, without lesion, scant white creamy discharge, vaginal walls and external genitalia normal  Bimanual exam: Cervix 0/long/high, firm, anterior, neg CMT, uterus nontender, nonenlarged, adnexa without tenderness, enlargement, or mass  MAU Course  Procedures  MDM Pelvic exam Trans-vaginal US CBC HIV test UA Blood type: O+ GC/Chlamydia Wet Prep  Assessment and Plan   -GC/Chlamydia pending   Knox RoyaltyRamirez, Nicholas D, Student-PA   12/07/2014, 10:06 AM   US showed SIUP, living   Sized = dates Plan to refer to prenatal care Agree with above note Aviva SignsMarie L Athleen Feltner, CNM

## 2014-12-08 LAB — GC/CHLAMYDIA PROBE AMP
CT PROBE, AMP APTIMA: NEGATIVE
GC Probe RNA: NEGATIVE

## 2015-01-09 LAB — OB RESULTS CONSOLE ABO/RH: RH Type: POSITIVE

## 2015-01-09 LAB — OB RESULTS CONSOLE ANTIBODY SCREEN: ANTIBODY SCREEN: NEGATIVE

## 2015-02-16 ENCOUNTER — Encounter (HOSPITAL_COMMUNITY): Payer: Self-pay | Admitting: *Deleted

## 2015-02-16 ENCOUNTER — Emergency Department (HOSPITAL_COMMUNITY)
Admission: EM | Admit: 2015-02-16 | Discharge: 2015-02-16 | Disposition: A | Discharge status: Home / Self Care | Payer: Medicaid Other | Source: Home / Self Care | Attending: Family Medicine | Admitting: Family Medicine

## 2015-02-16 DIAGNOSIS — J020 Streptococcal pharyngitis: Secondary | ICD-10-CM

## 2015-02-16 LAB — POCT RAPID STREP A: STREPTOCOCCUS, GROUP A SCREEN (DIRECT): NEGATIVE

## 2015-02-16 MED ORDER — AMOXICILLIN 500 MG PO CAPS: 500.0000 mg | ORAL_CAPSULE | Freq: Three times a day (TID) | ORAL | Status: DC

## 2015-02-16 NOTE — Discharge Instructions (Signed)
Drink lots of fluids, take all of medicine, use lozenges as needed.return if needed °

## 2015-02-16 NOTE — ED Provider Notes (Signed)
CSN: [id number]     Arrival date & time 02/16/15  0930 History   First MD Initiated Contact with Patient 02/16/15 [phone]     Chief Complaint  Patient presents with  . Sore Throat   (Consider location/radiation/quality/duration/timing/severity/associated sxs/prior Treatment) Patient is a 24 y.o. female presenting with pharyngitis. The history is provided by the patient.  Sore Throat This is a new problem. The current episode started yesterday. The problem has been gradually worsening. Associated symptoms include headaches. Associated symptoms comments: Yellow pnd.. The symptoms are aggravated by swallowing.    Past Medical History  Diagnosis Date  . Medical history non-contributory    Past Surgical History  Procedure Laterality Date  . No past surgeries    . Extraction of wisdom     Family History  Problem Relation Age of Onset  . Arthritis Neg Hx   . Asthma Neg Hx   . Birth defects Neg Hx   . Depression Neg Hx   . Drug abuse Neg Hx   . Early death Neg Hx   . Hearing loss Neg Hx   . Heart disease Neg Hx   . Hyperlipidemia Neg Hx   . Hypertension Neg Hx   . Kidney disease Neg Hx   . Learning disabilities Neg Hx   . Mental illness Neg Hx   . Mental retardation Neg Hx   . Miscarriages / Stillbirths Neg Hx   . Stroke Neg Hx   . Vision loss Neg Hx   . Varicose Veins Neg Hx   . Diabetes Paternal Aunt   . Alcohol abuse Maternal Grandfather   . COPD Maternal Grandfather   . Diabetes Paternal Grandmother    History  Substance Use Topics  . Smoking status: Never Smoker   . Smokeless tobacco: Never Used  . Alcohol Use: No   OB History    Gravida Para Term Preterm AB TAB SAB Ectopic Multiple Living   0     Review of Systems  Constitutional: Negative for fever.  HENT: Positive for congestion, postnasal drip and sore throat. Negative for rhinorrhea.   Respiratory: Negative for cough.   Gastrointestinal: Negative.   Neurological: Positive for headaches.     Allergies  Review of patient's allergies indicates no known allergies.  Home Medications   Prior to Admission medications   Medication Sig Start Date End Date Taking? Authorizing Provider  amoxicillin (AMOXIL) 500 MG capsule Take 1 capsule (500 mg total) by mouth 3 (three) times daily. 02/16/15   Linna Hoff, MD  Prenatal Vit-Fe Fumarate-FA (PRENATAL MULTIVITAMIN) TABS tablet Take 1 tablet by mouth daily.    Historical Provider, MD   BP 118/71 mmHg  Pulse 98  Temp(Src) 98.1 F (36.7 C) (Oral)  Resp 22  SpO2 100%  LMP 10/14/2014 (Exact Date) Physical Exam  Constitutional: She is oriented to person, place, and time. She appears well-developed and well-nourished. No distress.  HENT:  Head: Normocephalic.  Right Ear: External ear normal.  Left Ear: External ear normal.  Mouth/Throat: Uvula is midline and mucous membranes are normal. Posterior oropharyngeal erythema present. No oropharyngeal exudate.  Neck: Normal range of motion. Neck supple.  Cardiovascular: Normal heart sounds.   Lymphadenopathy:    She has cervical adenopathy.  Neurological: She is alert and oriented to person, place, and time.  Skin: Skin is warm and dry.  Nursing note and vitals reviewed.   ED Course  Procedures (including critical care time) Labs  Review strep neg.  Imaging Review No results found.   MDM   1. Strep throat    Pt 17 wk preg.    Linna HoffJames D Kuper Rennels, MD 02/16/15 [phone]

## 2015-02-16 NOTE — ED Notes (Signed)
Pt  Reports      sorethroat   With   Pain  When  She  Swallows     With  Headache  As   Well       -     Symptoms  Began  Yesterday          Pt is  Pregnant

## 2015-02-18 LAB — CULTURE, GROUP A STREP: Strep A Culture: NEGATIVE

## 2015-03-27 ENCOUNTER — Other Ambulatory Visit: Payer: Self-pay | Admitting: Obstetrics

## 2015-03-28 NOTE — Progress Notes (Signed)
Note in error- patient is not seeing [facility] for this pregnancy

## 2015-06-09 ENCOUNTER — Inpatient Hospital Stay (HOSPITAL_COMMUNITY)
Admission: AD | Admit: 2015-06-09 | Discharge: 2015-06-09 | Disposition: A | Discharge status: Home / Self Care | Payer: Medicaid Other | Source: Ambulatory Visit | Attending: Obstetrics & Gynecology | Admitting: Obstetrics & Gynecology

## 2015-06-09 ENCOUNTER — Inpatient Hospital Stay (HOSPITAL_COMMUNITY): Payer: Medicaid Other

## 2015-06-09 ENCOUNTER — Encounter (HOSPITAL_COMMUNITY): Payer: Self-pay | Admitting: *Deleted

## 2015-06-09 DIAGNOSIS — O368130 Decreased fetal movements, third trimester, not applicable or unspecified: Secondary | ICD-10-CM | POA: Insufficient documentation

## 2015-06-09 DIAGNOSIS — Z3A34 34 weeks gestation of pregnancy: Secondary | ICD-10-CM | POA: Diagnosis not present

## 2015-06-09 DIAGNOSIS — O9989 Other specified diseases and conditions complicating pregnancy, childbirth and the puerperium: Secondary | ICD-10-CM | POA: Diagnosis not present

## 2015-06-09 DIAGNOSIS — O368190 Decreased fetal movements, unspecified trimester, not applicable or unspecified: Secondary | ICD-10-CM

## 2015-06-09 DIAGNOSIS — R109 Unspecified abdominal pain: Secondary | ICD-10-CM | POA: Diagnosis present

## 2015-06-09 DIAGNOSIS — O368131 Decreased fetal movements, third trimester, fetus 1: Secondary | ICD-10-CM

## 2015-06-09 LAB — URINALYSIS, ROUTINE W REFLEX MICROSCOPIC
BILIRUBIN URINE: NEGATIVE
Glucose, UA: NEGATIVE mg/dL
Hgb urine dipstick: NEGATIVE
Ketones, ur: NEGATIVE mg/dL
Leukocytes, UA: NEGATIVE
NITRITE: NEGATIVE
PROTEIN: NEGATIVE mg/dL
Specific Gravity, Urine: 1.015 (ref 1.005–1.030)
UROBILINOGEN UA: 0.2 mg/dL (ref 0.0–1.0)
pH: 7 (ref 5.0–8.0)

## 2015-06-09 LAB — WET PREP, GENITAL
Clue Cells Wet Prep HPF POC: NONE SEEN
Trich, Wet Prep: NONE SEEN
Yeast Wet Prep HPF POC: NONE SEEN

## 2015-06-09 NOTE — MAU Note (Signed)
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  Today got up and started getting ready for work and noticed small spots of red blood when wiping after urinating.  Stomach hurts to touch

## 2015-06-09 NOTE — Discharge Instructions (Signed)

## 2015-06-09 NOTE — MAU Provider Note (Signed)
Tina [name] is a 24 y.o. G3P0 at 34.0 weeks report a right side pain and decreased fetal movement   History     Patient Active Problem List   Diagnosis Date Noted  . Spontaneous abortion in second trimester 03/24/2014  . Preterm labor in second trimester without delivery 03/23/2014  . Preterm labor 03/23/2014  . Antepartum placental abruption in second trimester 03/12/2014  . Unspecified high-risk pregnancy 02/21/2014  . Supervision of normal first pregnancy 12/27/2013    Chief Complaint  Patient presents with  . Vaginal Bleeding  . Abdominal Pain   HPI  OB History    Gravida Para Term Preterm AB TAB SAB Ectopic Multiple Living   3 1  1 1  1    0      Past Medical History  Diagnosis Date  . Medical history non-contributory     Past Surgical History  Procedure Laterality Date  . No past surgeries    . Extraction of wisdom      Family History  Problem Relation Age of Onset  . Arthritis Neg Hx   . Asthma Neg Hx   . Birth defects Neg Hx   . Depression Neg Hx   . Drug abuse Neg Hx   . Early death Neg Hx   . Hearing loss Neg Hx   . Heart disease Neg Hx   . Hyperlipidemia Neg Hx   . Hypertension Neg Hx   . Kidney disease Neg Hx   . Learning disabilities Neg Hx   . Mental illness Neg Hx   . Mental retardation Neg Hx   . Miscarriages / Stillbirths Neg Hx   . Stroke Neg Hx   . Vision loss Neg Hx   . Varicose Veins Neg Hx   . Diabetes Paternal Aunt   . Alcohol abuse Maternal Grandfather   . COPD Maternal Grandfather   . Diabetes Paternal Grandmother     History  Substance Use Topics  . Smoking status: Never Smoker   . Smokeless tobacco: Never Used  . Alcohol Use: No    Allergies: No Known Allergies  Prescriptions prior to admission  Medication Sig Dispense Refill Last Dose  . Prenatal Vit-Fe Fumarate-FA (PRENATAL MULTIVITAMIN) TABS tablet Take 1 tablet by mouth daily.   06/08/2015 at Unknown time  . amoxicillin (AMOXIL) 500 MG capsule Take 1 capsule  (500 mg total) by mouth 3 (three) times daily. (Patient not taking: Reported on 06/09/2015) 30 capsule 0 Completed Course at Unknown time    ROS See HPI above, all other systems are negative  Physical Exam   Blood pressure 142/61, pulse 91, temperature 98.1 F (36.7 C), temperature source Oral, resp. rate 20, height 5\' 1"  (1.549 m), weight 235 lb (106.595 kg), last menstrual period 10/14/2014, unknown if currently breastfeeding.  Physical Exam Ext:  WNL ABD: Soft, non tender to palpation, no rebound or guarding SVE: FT/T/H   ED Course  Assessment: IUP at  34.0weeks Membranes: intact FHR: Category 1 CTX:  None Foul vagina odor  Plan:  Labs: wet prep, gc/ct [place]   Verlie Liotta, CNM, MSN 06/09/2015. 2:16 PM   MAU Addendum Note  Results for orders placed or performed during the hospital encounter of 06/09/15 (from the past 24 hour(s))  Wet prep, genital     Status: Abnormal   Collection Time: 06/09/15  1:12 PM  Result Value Ref Range   Yeast Wet Prep HPF POC NONE SEEN NONE SEEN   Trich, Wet Prep NONE SEEN NONE SEEN  Clue Cells Wet Prep HPF POC NONE SEEN NONE SEEN   WBC, Wet Prep HPF POC FEW (A) NONE SEEN   [place] - FHR 144, AFI 10.25m BPP 8/8 NST reactive   Plan: -Discussed need to follow up in office on next OB appointment -Bleeding and PTL Precautions -Encouraged to call if any questions or concerns arise prior to next scheduled office visit.  -Discharged to home in stable condition Consulted with Dr.   Adelina Mings, CNM, MSN 06/09/2015. 2:17 PM

## 2015-06-11 LAB — GC/CHLAMYDIA PROBE AMP (~~LOC~~) NOT AT ARMC
Chlamydia: NEGATIVE
NEISSERIA GONORRHEA: NEGATIVE

## 2015-06-27 LAB — OB RESULTS CONSOLE GBS: GBS: POSITIVE

## 2015-06-27 LAB — OB RESULTS CONSOLE GC/CHLAMYDIA
Chlamydia: NEGATIVE
GC PROBE AMP, GENITAL: NEGATIVE

## 2015-07-08 ENCOUNTER — Inpatient Hospital Stay (HOSPITAL_COMMUNITY)
Admission: AD | Admit: 2015-07-08 | Discharge: 2015-07-12 | DRG: 765 | Disposition: A | Discharge status: Home / Self Care | Payer: Medicaid Other | Source: Ambulatory Visit | Attending: Obstetrics & Gynecology | Admitting: Obstetrics & Gynecology

## 2015-07-08 ENCOUNTER — Inpatient Hospital Stay (HOSPITAL_COMMUNITY): Payer: Medicaid Other | Admitting: Anesthesiology

## 2015-07-08 ENCOUNTER — Encounter (HOSPITAL_COMMUNITY): Payer: Self-pay | Admitting: *Deleted

## 2015-07-08 DIAGNOSIS — Z283 Underimmunization status: Secondary | ICD-10-CM

## 2015-07-08 DIAGNOSIS — O99214 Obesity complicating childbirth: Secondary | ICD-10-CM | POA: Diagnosis present

## 2015-07-08 DIAGNOSIS — E669 Obesity, unspecified: Secondary | ICD-10-CM | POA: Diagnosis present

## 2015-07-08 DIAGNOSIS — O09213 Supervision of pregnancy with history of pre-term labor, third trimester: Secondary | ICD-10-CM

## 2015-07-08 DIAGNOSIS — O99824 Streptococcus B carrier state complicating childbirth: Secondary | ICD-10-CM | POA: Diagnosis present

## 2015-07-08 DIAGNOSIS — Z6841 Body Mass Index (BMI) 40.0 and over, adult: Secondary | ICD-10-CM

## 2015-07-08 DIAGNOSIS — O09899 Supervision of other high risk pregnancies, unspecified trimester: Secondary | ICD-10-CM

## 2015-07-08 DIAGNOSIS — B951 Streptococcus, group B, as the cause of diseases classified elsewhere: Secondary | ICD-10-CM | POA: Diagnosis present

## 2015-07-08 DIAGNOSIS — O9081 Anemia of the puerperium: Secondary | ICD-10-CM | POA: Diagnosis not present

## 2015-07-08 DIAGNOSIS — Z2839 Other underimmunization status: Secondary | ICD-10-CM

## 2015-07-08 DIAGNOSIS — Z23 Encounter for immunization: Secondary | ICD-10-CM | POA: Diagnosis not present

## 2015-07-08 DIAGNOSIS — O326XX0 Maternal care for compound presentation, not applicable or unspecified: Secondary | ICD-10-CM | POA: Diagnosis present

## 2015-07-08 DIAGNOSIS — D649 Anemia, unspecified: Secondary | ICD-10-CM | POA: Diagnosis not present

## 2015-07-08 DIAGNOSIS — O9989 Other specified diseases and conditions complicating pregnancy, childbirth and the puerperium: Secondary | ICD-10-CM

## 2015-07-08 DIAGNOSIS — O09293 Supervision of pregnancy with other poor reproductive or obstetric history, third trimester: Secondary | ICD-10-CM

## 2015-07-08 DIAGNOSIS — Z3A38 38 weeks gestation of pregnancy: Secondary | ICD-10-CM | POA: Diagnosis present

## 2015-07-08 DIAGNOSIS — Z98891 History of uterine scar from previous surgery: Secondary | ICD-10-CM

## 2015-07-08 DIAGNOSIS — N858 Other specified noninflammatory disorders of uterus: Secondary | ICD-10-CM | POA: Diagnosis present

## 2015-07-08 LAB — CBC
HEMATOCRIT: 39.3 % (ref 36.0–46.0)
Hemoglobin: 13.7 g/dL (ref 12.0–15.0)
MCH: 32.4 pg (ref 26.0–34.0)
MCHC: 34.9 g/dL (ref 30.0–36.0)
MCV: 92.9 fL (ref 78.0–100.0)
Platelets: 204 10*3/uL (ref 150–400)
RBC: 4.23 MIL/uL (ref 3.87–5.11)
RDW: 13.8 % (ref 11.5–15.5)
WBC: 14 10*3/uL — AB (ref 4.0–10.5)

## 2015-07-08 LAB — TYPE AND SCREEN
ABO/RH(D): O POS
ANTIBODY SCREEN: NEGATIVE

## 2015-07-08 MED ORDER — PHENYLEPHRINE 40 MCG/ML (10ML) SYRINGE FOR IV PUSH (FOR BLOOD PRESSURE SUPPORT): 80.0000 ug | PREFILLED_SYRINGE | INTRAVENOUS | Status: DC | PRN

## 2015-07-08 MED ORDER — LACTATED RINGERS IV SOLN
INTRAVENOUS | Status: DC
  Administered 2015-07-09: 05:00:00 via INTRAVENOUS

## 2015-07-08 MED ORDER — LACTATED RINGERS IV SOLN
500.0000 mL | INTRAVENOUS | Status: DC | PRN
  Administered 2015-07-08 (×2): 500 mL via INTRAVENOUS

## 2015-07-08 MED ORDER — FENTANYL 2.5 MCG/ML BUPIVACAINE 1/10 % EPIDURAL INFUSION (WH - ANES)
12.0000 mL/h | INTRAMUSCULAR | Status: DC | PRN
  Administered 2015-07-08: 12 mL/h via EPIDURAL
  Filled 2015-07-08: qty 125

## 2015-07-08 MED ORDER — LIDOCAINE HCL (PF) 1 % IJ SOLN
30.0000 mL | INTRAMUSCULAR | Status: DC | PRN
  Filled 2015-07-08: qty 30

## 2015-07-08 MED ORDER — ONDANSETRON HCL 4 MG/2ML IJ SOLN: 4.0000 mg | Freq: Four times a day (QID) | INTRAMUSCULAR | Status: DC | PRN

## 2015-07-08 MED ORDER — DIPHENHYDRAMINE HCL 50 MG/ML IJ SOLN
12.5000 mg | INTRAMUSCULAR | Status: DC | PRN
Start: 2015-07-08 — End: 2015-07-09

## 2015-07-08 MED ORDER — FENTANYL CITRATE (PF) 100 MCG/2ML IJ SOLN
100.0000 ug | INTRAMUSCULAR | Status: DC | PRN
  Filled 2015-07-08: qty 2

## 2015-07-08 MED ORDER — EPHEDRINE 5 MG/ML INJ
10.0000 mg | INTRAVENOUS | Status: DC | PRN
Start: 2015-07-08 — End: 2015-07-09
  Filled 2015-07-08 (×2): qty 4

## 2015-07-08 MED ORDER — LIDOCAINE HCL (PF) 1 % IJ SOLN
INTRAMUSCULAR | Status: DC | PRN
  Administered 2015-07-08 (×2): 4 mL via EPIDURAL

## 2015-07-08 MED ORDER — ACETAMINOPHEN 325 MG PO TABS: 650.0000 mg | ORAL_TABLET | ORAL | Status: DC | PRN

## 2015-07-08 MED ORDER — CITRIC ACID-SODIUM CITRATE 334-500 MG/5ML PO SOLN
30.0000 mL | ORAL | Status: DC | PRN
  Administered 2015-07-09: 30 mL via ORAL
  Filled 2015-07-08: qty 15

## 2015-07-08 MED ORDER — DIPHENHYDRAMINE HCL 50 MG/ML IJ SOLN
12.5000 mg | INTRAMUSCULAR | Status: DC | PRN
Start: 2015-07-08 — End: 2015-07-08

## 2015-07-08 MED ORDER — LACTATED RINGERS IV SOLN
INTRAVENOUS | Status: DC
  Administered 2015-07-08: 300 mL via INTRAUTERINE

## 2015-07-08 MED ORDER — FENTANYL 2.5 MCG/ML BUPIVACAINE 1/10 % EPIDURAL INFUSION (WH - ANES)
14.0000 mL/h | INTRAMUSCULAR | Status: DC | PRN
Start: 2015-07-08 — End: 2015-07-08
  Filled 2015-07-08: qty 125

## 2015-07-08 MED ORDER — OXYTOCIN BOLUS FROM INFUSION: 500.0000 mL | INTRAVENOUS | Status: DC

## 2015-07-08 MED ORDER — PENICILLIN G POTASSIUM 5000000 UNITS IJ SOLR
5.0000 10*6.[IU] | Freq: Once | INTRAVENOUS | Status: AC
  Administered 2015-07-08: 5 10*6.[IU] via INTRAVENOUS
  Filled 2015-07-08: qty 5

## 2015-07-08 MED ORDER — FLEET ENEMA 7-19 GM/118ML RE ENEM: 1.0000 | ENEMA | RECTAL | Status: DC | PRN

## 2015-07-08 MED ORDER — DEXTROSE 5 % IV SOLN
2.5000 10*6.[IU] | INTRAVENOUS | Status: DC
  Administered 2015-07-09 (×2): 2.5 10*6.[IU] via INTRAVENOUS
  Filled 2015-07-08 (×5): qty 2.5

## 2015-07-08 MED ORDER — TERBUTALINE SULFATE 1 MG/ML IJ SOLN
INTRAMUSCULAR | Status: AC
  Administered 2015-07-08: 0.25 mg via SUBCUTANEOUS
  Filled 2015-07-08: qty 1

## 2015-07-08 MED ORDER — OXYTOCIN 40 UNITS IN LACTATED RINGERS INFUSION - SIMPLE MED
62.5000 mL/h | INTRAVENOUS | Status: DC
  Filled 2015-07-08: qty 1000

## 2015-07-08 MED ORDER — OXYCODONE-ACETAMINOPHEN 5-325 MG PO TABS: 2.0000 | ORAL_TABLET | ORAL | Status: DC | PRN

## 2015-07-08 MED ORDER — PHENYLEPHRINE 40 MCG/ML (10ML) SYRINGE FOR IV PUSH (FOR BLOOD PRESSURE SUPPORT)
80.0000 ug | PREFILLED_SYRINGE | INTRAVENOUS | Status: DC | PRN
  Filled 2015-07-08: qty 20

## 2015-07-08 MED ORDER — OXYCODONE-ACETAMINOPHEN 5-325 MG PO TABS: 1.0000 | ORAL_TABLET | ORAL | Status: DC | PRN

## 2015-07-08 NOTE — Progress Notes (Signed)
Labor Progress  Subjective: Call top room by primary care nurse for a prolong decel. Pt c/o intense pain in the LRQ.  Pain management reviewed  Objective: BP 122/88 mmHg  Pulse 83  Temp(Src) 98.7 F (37.1 C) (Oral)  Resp 20  Ht 5\' 2"  (1.575 m)  Wt 243 lb (110.224 kg)  BMI 44.43 kg/m2  SpO2 100%  LMP 10/14/2014 (Exact Date)     FHT: 130, moderate variability, with a prolong decel for 4 minutes CTX:  regular, every 3-4 minutes per palpation and pt reaction Uterus gravid, soft non tender SVE:  5/70/-1   Assessment:  IUP at 38.1 weeks NICHD: Category 3 at 2219, upgrade to category 2 by 2220 Membranes:  Broke during exam at 2245, clear FSE and IUPC placed at 2245 Labor progress: adequate active labor GBS: positive   Plan: Continue labor plan Continuous monitoring Epidural per pt request Frequent position changes to facilitate fetal rotation and descent. Dr Su Hiltoberts called strip reviewed Amnioinfusion with bolus of 300 cc, then 150 cc/hr.      Tina [name], CNM, MSN 07/08/2015. 11:15 PM   Addendum 2300 FHR 130, moderate variability, + accel, early decels, ctx 2-3 minutes 2309 FHR 130 with prolong decel x 4 minutes, ctx coupling w/ 2 minutes q 5,  Dr Molly Maduroobert aware, coming to bedside for eval. 2328 terb ordered 2340 FHR  125, moderate variability, no accel, early decels ctx q4-5, Dr Su Hiltoberts aware 0015 FHR 120 marked variability, + accel, variable decels, ctx 3-4 0032 FHR 120 marked variability, + accel, prolong decel, Dr Su Hiltoberts aware 0115 FHR 125 marked variability, + accel, no decels, ctx 3-4 Dr Su Hiltoberts aware

## 2015-07-08 NOTE — Anesthesia Preprocedure Evaluation (Addendum)
Anesthesia Evaluation  Patient identified by MRN, date of birth, ID band Patient awake    Reviewed: Allergy & Precautions, H&P , NPO status , Patient's Chart, lab work & pertinent test results  Airway Mallampati: III  TM Distance: >3 FB Neck ROM: full    Dental no notable dental hx. (+) Teeth Intact   Pulmonary neg pulmonary ROS,  breath sounds clear to auscultation  Pulmonary exam normal       Cardiovascular negative cardio ROS Normal cardiovascular examRhythm:regular Rate:Normal     Neuro/Psych negative neurological ROS  negative psych ROS   GI/Hepatic Neg liver ROS, GERD-  ,  Endo/Other  Morbid obesity  Renal/GU negative Renal ROS  negative genitourinary   Musculoskeletal   Abdominal   Peds  Hematology negative hematology ROS (+)   Anesthesia Other Findings   Reproductive/Obstetrics (+) Pregnancy                            Anesthesia Physical Anesthesia Plan  ASA: III and emergent  Anesthesia Plan: Epidural   Post-op Pain Management:    Induction:   Airway Management Planned: Natural Airway  Additional Equipment:   Intra-op Plan:   Post-operative Plan:   Informed Consent: I have reviewed the patients History and Physical, chart, labs and discussed the procedure including the risks, benefits and alternatives for the proposed anesthesia with the patient or authorized representative who has indicated his/her understanding and acceptance.     Plan Discussed with: Anesthesiologist, CRNA and Surgeon  Anesthesia Plan Comments: (Patient for C/Section for fetal intolerance to labor. Will use epidural for C/Section.)       Anesthesia Quick Evaluation

## 2015-07-08 NOTE — MAU Note (Signed)
Pt presents to MAU with complaints of contractions all day. Denies any vaginal bleeding or LOF

## 2015-07-08 NOTE — H&P (Signed)
Tina [name] is a 24 y.o. female, [id number] at 61 1/7 weeks, presenting for regular UCs all day, now q 3-5 min x 2 hours.  Denies leaking or bleeding, reports +FM.Marland Kitchen  Patient Active Problem List   Diagnosis Date Noted  . Preterm delivery--2015, 22 weeks, iufd 07/08/2015  . Positive GBS test 07/08/2015  . Normal labor 07/08/2015  . Rubella non-immune status, antepartum 07/08/2015  . Spontaneous abortion in second trimester 03/24/2014  . Antepartum placental abruption in second trimester 03/12/2014    History of present pregnancy: Patient entered care at 14 4/7 weeks.   EDC of 07/21/15 was established by LMP and [place] at 7 5/7 weeks.   Anatomy scan:  19 4/7 weeks, with limited anatomy and an right lateral placenta.  Cervix 3.62 Additional [place] evaluations:   23 4/7 weeks, for f/u anatomy:  Still unable to see ROT, cervix 4.75.   27 4/7 weeks:  Still limited cardiac anatomy, cervix WNL Declined weekly cervical [place] evaluations Significant prenatal events:  Declined 17P and cerclage.  Cervix remained WNL on [place] evaluations.  Measured S<D at 36 weeks, with [place] planned for next visit.  Last evaluation:  07/04/15--small amount spotting, cervix 2 cm, 50%, vtx, -2  OB History    Gravida Para Term Preterm AB TAB SAB Ectopic Multiple Living   0    02/2014--SVB, 22 weeks, presented with dilated cervix, BBOW, magnesium sulfate, delivered with fetal demise.  Pregnancy had elevated DS risk of 1:64, female. 06/2014--SAB at 6 weeks.  Past Medical History  Diagnosis Date  . Medical history non-contributory    Past Surgical History  Procedure Laterality Date  . No past surgeries    . Extraction of wisdom     Family History: family history includes Alcohol abuse in her maternal grandfather; COPD in her maternal grandfather; Diabetes in her paternal aunt and paternal grandmother. There is no history of Arthritis, Asthma, Birth defects, Depression, Drug abuse, Early death, Hearing loss, Heart disease,  Hyperlipidemia, Hypertension, Kidney disease, Learning disabilities, Mental illness, Mental retardation, Miscarriages / Stillbirths, Stroke, Vision loss, or Varicose Veins.   Social History:  reports that she has never smoked. She has never used smokeless tobacco. She reports that she does not drink alcohol or use illicit drugs.  Patient is Hispanic, of the Catholic faith, single, with FOB, [employer], involved and supportive.  Patient has 2 years of college, employed as store associate.   Prenatal Transfer Tool  Maternal Diabetes: No Genetic Screening: Normal Maternal Ultrasounds/Referrals: Normal Fetal Ultrasounds or other Referrals:  None Maternal Substance Abuse:  No Significant Maternal Medications:  None Significant Maternal Lab Results: Lab values include: Group B Strep positive  TDAP NA Flu NA  ROS:  Contractions, +FM  No Known Allergies   Dilation: 4 Effacement (%): 70 Station: -2 Exam by:: Dellie Burns, RN BSN Blood pressure 98/68, pulse 84, temperature 99 F (37.2 C), temperature source Oral, resp. rate 20, height  (1.575 m), weight 110.224 kg (243 lb), last menstrual period 10/14/2014, SpO2 100 %, unknown if currently breastfeeding.  In moderate distress from UCs Chest clear Heart RRR without murmur Abd gravid, NT, FH 39 cm Pelvic: As above, IBOW Ext: WNL  FHR: Category 1 UCs:  q 3 min, moderate  Prenatal labs: ABO, Rh: --/--/O POS (07/10 1910)O+ Antibody: NEG (07/10 1910)Neg Rubella:   Non-immune RPR: Nonreactive (12/10 0000) NR HBsAg: Negative (12/10 0000) Neg HIV: NONREACTIVE (12/10 1009)  GBS: Positive (  06/29 0000)Positive6/29/16 Sickle cell/Hgb electrophoresis:  AA Pap:  Unknown GC:  Negative 10/07/14 and 06/27/15 Chlamydia:  Negative 10/07/14 and 06/27/15 Genetic screenings:  Declined Glucola:  WNL Other:   Hgb 12.3 at NOB, 12.3 at 28 weeks       Assessment/Plan: IUP at 38 1/7 weeks Early labor GBS positive Rubella  non-immune Obesity Hx previous PTD at 22 weeks  [facility]: [facility] Dr[facility] Su Hiltoberts Routine [facility] orders Pain med/epidural prn--declines at present PCN G for GBS prophylaxis   Tina [name], VICKICNM, [place] 07/08/2015, 9:01 PM

## 2015-07-08 NOTE — Anesthesia Procedure Notes (Signed)
Epidural Patient location during procedure: OB Start time: 07/08/2015 10:58 PM  Staffing Anesthesiologist: Mal AmabileFOSTER, Gwyndolyn Guilford Performed by: anesthesiologist   Preanesthetic Checklist Completed: patient identified, site marked, surgical consent, pre-op evaluation, timeout performed, IV checked, risks and benefits discussed and monitors and equipment checked  Epidural Patient position: sitting Prep: site prepped and draped and DuraPrep Patient monitoring: continuous pulse ox and blood pressure Approach: midline Location: L3-L4 Injection technique: LOR air  Needle:  Needle type: Tuohy  Needle gauge: 17 G Needle length: 9 cm and 9 Needle insertion depth: 8 cm Catheter type: closed end flexible Catheter size: 19 Gauge Catheter at skin depth: 14 cm Test dose: negative and Other  Assessment Events: blood not aspirated, injection not painful, no injection resistance, negative IV test and no paresthesia  Additional Notes Patient identified. Risks and benefits discussed including failed block, incomplete  Pain control, post dural puncture headache, nerve damage, paralysis, blood pressure Changes, nausea, vomiting, reactions to medications-both toxic and allergic and post Partum back pain. All questions were answered. Patient expressed understanding and wished to proceed. Sterile technique was used throughout procedure. Epidural site was Dressed with sterile barrier dressing. No paresthesias, signs of intravascular injection Or signs of intrathecal spread were encountered.  Patient was more comfortable after the epidural was dosed. Please see RN's note for documentation of vital signs and FHR which are stable.

## 2015-07-08 NOTE — Progress Notes (Signed)
Addendum  Pt wants and unmediated low intervention labor. Pt encourage to ambulate in the hall and use the birth ball Category 1 tracing OK for intermitting monitor

## 2015-07-09 ENCOUNTER — Encounter (HOSPITAL_COMMUNITY)
Admission: AD | Disposition: A | Discharge status: Home / Self Care | Payer: Self-pay | Source: Ambulatory Visit | Attending: Obstetrics & Gynecology

## 2015-07-09 ENCOUNTER — Encounter (HOSPITAL_COMMUNITY): Payer: Self-pay

## 2015-07-09 DIAGNOSIS — Z98891 History of uterine scar from previous surgery: Secondary | ICD-10-CM

## 2015-07-09 LAB — RPR: RPR Ser Ql: NONREACTIVE

## 2015-07-09 LAB — HIV ANTIBODY (ROUTINE TESTING W REFLEX): HIV Screen 4th Generation wRfx: NONREACTIVE

## 2015-07-09 SURGERY — Surgical Case
Anesthesia: Epidural

## 2015-07-09 MED ORDER — OXYTOCIN 10 UNIT/ML IJ SOLN
INTRAMUSCULAR | Status: AC
  Filled 2015-07-09: qty 4

## 2015-07-09 MED ORDER — IBUPROFEN 600 MG PO TABS: 600.0000 mg | ORAL_TABLET | Freq: Four times a day (QID) | ORAL | Status: DC | PRN

## 2015-07-09 MED ORDER — KETOROLAC TROMETHAMINE 30 MG/ML IJ SOLN
INTRAMUSCULAR | Status: AC
  Filled 2015-07-09: qty 1

## 2015-07-09 MED ORDER — OXYTOCIN 10 UNIT/ML IJ SOLN
40.0000 [IU] | INTRAVENOUS | Status: DC | PRN
  Administered 2015-07-09: 40 [IU] via INTRAVENOUS

## 2015-07-09 MED ORDER — LACTATED RINGERS IV SOLN
INTRAVENOUS | Status: DC
  Administered 2015-07-09: 19:00:00 via INTRAVENOUS

## 2015-07-09 MED ORDER — ZOLPIDEM TARTRATE 5 MG PO TABS: 5.0000 mg | ORAL_TABLET | Freq: Every evening | ORAL | Status: DC | PRN

## 2015-07-09 MED ORDER — MORPHINE SULFATE (PF) 0.5 MG/ML IJ SOLN
INTRAMUSCULAR | Status: DC | PRN
  Administered 2015-07-09: 4 mg via EPIDURAL

## 2015-07-09 MED ORDER — TERBUTALINE SULFATE 1 MG/ML IJ SOLN
0.2500 mg | Freq: Once | INTRAMUSCULAR | Status: AC
  Administered 2015-07-08: 0.25 mg via SUBCUTANEOUS

## 2015-07-09 MED ORDER — SCOPOLAMINE 1 MG/3DAYS TD PT72
MEDICATED_PATCH | TRANSDERMAL | Status: DC | PRN
  Administered 2015-07-09: 1 via TRANSDERMAL

## 2015-07-09 MED ORDER — NALBUPHINE HCL 10 MG/ML IJ SOLN: 5.0000 mg | INTRAMUSCULAR | Status: DC | PRN

## 2015-07-09 MED ORDER — SODIUM CHLORIDE 0.9 % IJ SOLN: 3.0000 mL | INTRAMUSCULAR | Status: DC | PRN

## 2015-07-09 MED ORDER — NALBUPHINE HCL 10 MG/ML IJ SOLN: 5.0000 mg | Freq: Once | INTRAMUSCULAR | Status: DC | PRN

## 2015-07-09 MED ORDER — DIPHENHYDRAMINE HCL 25 MG PO CAPS: 25.0000 mg | ORAL_CAPSULE | Freq: Four times a day (QID) | ORAL | Status: DC | PRN

## 2015-07-09 MED ORDER — SCOPOLAMINE 1 MG/3DAYS TD PT72
MEDICATED_PATCH | TRANSDERMAL | Status: AC
Start: 2015-07-09 — End: 2015-07-09
  Filled 2015-07-09: qty 1

## 2015-07-09 MED ORDER — ONDANSETRON HCL 4 MG/2ML IJ SOLN
4.0000 mg | Freq: Three times a day (TID) | INTRAMUSCULAR | Status: DC | PRN
  Administered 2015-07-09: 4 mg via INTRAVENOUS

## 2015-07-09 MED ORDER — DIBUCAINE 1 % RE OINT: 1.0000 "application " | TOPICAL_OINTMENT | RECTAL | Status: DC | PRN

## 2015-07-09 MED ORDER — KETOROLAC TROMETHAMINE 30 MG/ML IJ SOLN: 30.0000 mg | Freq: Four times a day (QID) | INTRAMUSCULAR | Status: DC | PRN

## 2015-07-09 MED ORDER — ONDANSETRON HCL 4 MG/2ML IJ SOLN
INTRAMUSCULAR | Status: AC
  Filled 2015-07-09: qty 2

## 2015-07-09 MED ORDER — MENTHOL 3 MG MT LOZG
1.0000 | LOZENGE | OROMUCOSAL | Status: DC | PRN
Start: 2015-07-09 — End: 2015-07-12

## 2015-07-09 MED ORDER — OXYCODONE-ACETAMINOPHEN 5-325 MG PO TABS
2.0000 | ORAL_TABLET | ORAL | Status: DC | PRN
Start: 2015-07-09 — End: 2015-07-12
  Administered 2015-07-10 – 2015-07-11 (×6): 2 via ORAL
  Filled 2015-07-09 (×5): qty 2

## 2015-07-09 MED ORDER — TETANUS-DIPHTH-ACELL PERTUSSIS 5-2.5-18.5 LF-MCG/0.5 IM SUSP
0.5000 mL | Freq: Once | INTRAMUSCULAR | Status: AC
  Administered 2015-07-10: 0.5 mL via INTRAMUSCULAR
  Filled 2015-07-09: qty 0.5

## 2015-07-09 MED ORDER — LANOLIN HYDROUS EX OINT: 1.0000 "application " | TOPICAL_OINTMENT | CUTANEOUS | Status: DC | PRN

## 2015-07-09 MED ORDER — OXYCODONE-ACETAMINOPHEN 5-325 MG PO TABS
1.0000 | ORAL_TABLET | ORAL | Status: DC | PRN
  Administered 2015-07-12: 1 via ORAL
  Filled 2015-07-09: qty 1

## 2015-07-09 MED ORDER — CEFAZOLIN SODIUM-DEXTROSE 2-3 GM-% IV SOLR
INTRAVENOUS | Status: AC
  Filled 2015-07-09: qty 50

## 2015-07-09 MED ORDER — LACTATED RINGERS IV SOLN
INTRAVENOUS | Status: DC | PRN
  Administered 2015-07-09 (×3): via INTRAVENOUS

## 2015-07-09 MED ORDER — PRENATAL MULTIVITAMIN CH
1.0000 | ORAL_TABLET | Freq: Every day | ORAL | Status: DC
  Administered 2015-07-10 – 2015-07-12 (×3): 1 via ORAL
  Filled 2015-07-09 (×3): qty 1

## 2015-07-09 MED ORDER — LACTATED RINGERS IV SOLN
INTRAVENOUS | Status: DC | PRN
  Administered 2015-07-09: 07:00:00 via INTRAVENOUS

## 2015-07-09 MED ORDER — LIDOCAINE-EPINEPHRINE (PF) 2 %-1:200000 IJ SOLN
INTRAMUSCULAR | Status: AC
  Filled 2015-07-09: qty 20

## 2015-07-09 MED ORDER — KETOROLAC TROMETHAMINE 30 MG/ML IJ SOLN
30.0000 mg | Freq: Four times a day (QID) | INTRAMUSCULAR | Status: DC | PRN
  Administered 2015-07-09: 30 mg via INTRAMUSCULAR

## 2015-07-09 MED ORDER — SENNOSIDES-DOCUSATE SODIUM 8.6-50 MG PO TABS
2.0000 | ORAL_TABLET | ORAL | Status: DC
  Administered 2015-07-09 – 2015-07-11 (×3): 2 via ORAL
  Filled 2015-07-09 (×3): qty 2

## 2015-07-09 MED ORDER — DEXTROSE 5 % IV SOLN
1.0000 ug/kg/h | INTRAVENOUS | Status: DC | PRN
  Filled 2015-07-09: qty 2

## 2015-07-09 MED ORDER — SODIUM BICARBONATE 8.4 % IV SOLN
INTRAVENOUS | Status: DC | PRN
  Administered 2015-07-09 (×2): 5 mL via EPIDURAL

## 2015-07-09 MED ORDER — OXYTOCIN 40 UNITS IN LACTATED RINGERS INFUSION - SIMPLE MED
62.5000 mL/h | INTRAVENOUS | Status: AC
Start: 2015-07-09 — End: 2015-07-10

## 2015-07-09 MED ORDER — ONDANSETRON HCL 4 MG/2ML IJ SOLN
INTRAMUSCULAR | Status: DC | PRN
  Administered 2015-07-09: 4 mg via INTRAVENOUS

## 2015-07-09 MED ORDER — NALOXONE HCL 0.4 MG/ML IJ SOLN: 0.4000 mg | INTRAMUSCULAR | Status: DC | PRN

## 2015-07-09 MED ORDER — DIPHENHYDRAMINE HCL 25 MG PO CAPS: 25.0000 mg | ORAL_CAPSULE | ORAL | Status: DC | PRN

## 2015-07-09 MED ORDER — DIPHENHYDRAMINE HCL 50 MG/ML IJ SOLN: 12.5000 mg | INTRAMUSCULAR | Status: DC | PRN

## 2015-07-09 MED ORDER — 0.9 % SODIUM CHLORIDE (POUR BTL) OPTIME
TOPICAL | Status: DC | PRN
  Administered 2015-07-09: 1000 mL

## 2015-07-09 MED ORDER — CEFAZOLIN SODIUM-DEXTROSE 2-3 GM-% IV SOLR
INTRAVENOUS | Status: DC | PRN
  Administered 2015-07-09: 2 g via INTRAVENOUS

## 2015-07-09 MED ORDER — MEPERIDINE HCL 25 MG/ML IJ SOLN: 6.2500 mg | INTRAMUSCULAR | Status: DC | PRN

## 2015-07-09 MED ORDER — FENTANYL CITRATE (PF) 100 MCG/2ML IJ SOLN: 25.0000 ug | INTRAMUSCULAR | Status: DC | PRN

## 2015-07-09 MED ORDER — SODIUM BICARBONATE 8.4 % IV SOLN
INTRAVENOUS | Status: AC
  Filled 2015-07-09: qty 50

## 2015-07-09 MED ORDER — SIMETHICONE 80 MG PO CHEW
80.0000 mg | CHEWABLE_TABLET | Freq: Three times a day (TID) | ORAL | Status: DC
  Administered 2015-07-09 – 2015-07-12 (×8): 80 mg via ORAL
  Filled 2015-07-09 (×9): qty 1

## 2015-07-09 MED ORDER — SIMETHICONE 80 MG PO CHEW
80.0000 mg | CHEWABLE_TABLET | ORAL | Status: DC
  Administered 2015-07-09 – 2015-07-11 (×4): 80 mg via ORAL
  Filled 2015-07-09 (×3): qty 1

## 2015-07-09 MED ORDER — SIMETHICONE 80 MG PO CHEW: 80.0000 mg | CHEWABLE_TABLET | ORAL | Status: DC | PRN

## 2015-07-09 MED ORDER — ACETAMINOPHEN 325 MG PO TABS
650.0000 mg | ORAL_TABLET | ORAL | Status: DC | PRN
  Administered 2015-07-09: 325 mg via ORAL
  Filled 2015-07-09: qty 2

## 2015-07-09 MED ORDER — WITCH HAZEL-GLYCERIN EX PADS: 1.0000 "application " | MEDICATED_PAD | CUTANEOUS | Status: DC | PRN

## 2015-07-09 MED ORDER — MORPHINE SULFATE 0.5 MG/ML IJ SOLN
INTRAMUSCULAR | Status: AC
Start: 2015-07-09 — End: 2015-07-09
  Filled 2015-07-09: qty 10

## 2015-07-09 MED ORDER — IBUPROFEN 600 MG PO TABS
600.0000 mg | ORAL_TABLET | Freq: Four times a day (QID) | ORAL | Status: DC
  Administered 2015-07-09 – 2015-07-12 (×12): 600 mg via ORAL
  Filled 2015-07-09 (×12): qty 1

## 2015-07-09 SURGICAL SUPPLY — 32 items
BENZOIN TINCTURE PRP APPL 2/3 (GAUZE/BANDAGES/DRESSINGS) ×2 IMPLANT
CLAMP CORD UMBIL (MISCELLANEOUS) IMPLANT
CLOTH BEACON ORANGE TIMEOUT ST (SAFETY) ×2 IMPLANT
CONTAINER PREFILL 10% NBF 15ML (MISCELLANEOUS) IMPLANT
DRAPE SHEET LG 3/4 BI-LAMINATE (DRAPES) IMPLANT
DRSG OPSITE POSTOP 4X10 (GAUZE/BANDAGES/DRESSINGS) ×2 IMPLANT
DURAPREP 26ML APPLICATOR (WOUND CARE) ×2 IMPLANT
ELECT REM PT RETURN 9FT ADLT (ELECTROSURGICAL) ×2
ELECTRODE REM PT RTRN 9FT ADLT (ELECTROSURGICAL) ×1 IMPLANT
EXTRACTOR VACUUM M CUP 4 TUBE (SUCTIONS) IMPLANT
GLOVE BIO SURGEON STRL SZ7.5 (GLOVE) ×2 IMPLANT
GLOVE BIOGEL PI IND STRL 7.0 (GLOVE) ×1 IMPLANT
GLOVE BIOGEL PI IND STRL 7.5 (GLOVE) ×2 IMPLANT
GLOVE BIOGEL PI INDICATOR 7.0 (GLOVE) ×1
GLOVE BIOGEL PI INDICATOR 7.5 (GLOVE) ×2
GOWN STRL REUS W/TWL LRG LVL3 (GOWN DISPOSABLE) ×4 IMPLANT
KIT ABG SYR 3ML LUER SLIP (SYRINGE) IMPLANT
NEEDLE HYPO 25X5/8 SAFETYGLIDE (NEEDLE) IMPLANT
NS IRRIG 1000ML POUR BTL (IV SOLUTION) ×2 IMPLANT
PACK C SECTION WH (CUSTOM PROCEDURE TRAY) ×2 IMPLANT
PAD OB MATERNITY 4.3X12.25 (PERSONAL CARE ITEMS) ×2 IMPLANT
RTRCTR C-SECT PINK 25CM LRG (MISCELLANEOUS) ×2 IMPLANT
STRIP CLOSURE SKIN 1/2X4 (GAUZE/BANDAGES/DRESSINGS) ×2 IMPLANT
SUT CHROMIC 2 0 CT 1 (SUTURE) ×2 IMPLANT
SUT MNCRL AB 3-0 PS2 27 (SUTURE) ×4 IMPLANT
SUT PLAIN 0 NONE (SUTURE) IMPLANT
SUT PLAIN 2 0 XLH (SUTURE) ×2 IMPLANT
SUT VIC AB 0 CT1 36 (SUTURE) ×2 IMPLANT
SUT VIC AB 0 CTX 36 (SUTURE) ×3
SUT VIC AB 0 CTX36XBRD ANBCTRL (SUTURE) ×3 IMPLANT
TOWEL OR 17X24 6PK STRL BLUE (TOWEL DISPOSABLE) ×2 IMPLANT
TRAY FOLEY CATH SILVER 14FR (SET/KITS/TRAYS/PACK) ×2 IMPLANT

## 2015-07-09 NOTE — Op Note (Signed)
Cesarean Section Procedure Note  Indications: 1638 2/7wks presented in labor with repeated episodes of prolonged decels thoughout the night.  Episode preceding decision to proceed with c-section showed fetal tracing to have marked variability with progressively decreasing baseline, slow recovery and exam unchanged.    Pre-operative Diagnosis: 1.38 2/7wks 2.Fetal Intolerance of Labor   Post-operative Diagnosis: 1.38 2/7wks 2.Fetal Intolerance of Labor 3.DOP with compound presentation  Procedure: PRIMARY LOW TRANSVERSE CESAREAN SECTION  Surgeon: Osborn CohoAngela Psalm Schappell, MD    Assistants: Venus Standard, CNM  Anesthesia: Epidural   Procedure Details  The patient was taken to the operating room secondary to fetal intolerance of labor after the risks, benefits, complications, treatment options, and expected outcomes were discussed with the patient.  The patient concurred with the proposed plan, giving informed consent which was signed and witnessed. The patient was taken to Operating Room C-Section Suite, identified as Tina [name] and the procedure verified as C-Section Delivery. A Time Out was held and the above information confirmed.  After induction of anesthesia by obtaining a spinal, the patient was prepped and draped in the usual sterile manner. A Pfannenstiel skin incision was made and carried down through the subcutaneous tissue to the underlying layer of fascia.  The fascia was incised bilaterally and extended transversely bilaterally with the Mayo scissors. Kocher clamps were placed on the inferior aspect of the fascial incision and the underlying rectus muscle was separated from the fascia. The same was done on the superior aspect of the fascial incision.  The peritoneum was identified, entered bluntly and extended manually.  An Alexis self-retaining retractor was placed.  The utero-vesical peritoneal reflection was incised transversely and the bladder flap was bluntly freed from the lower uterine  segment. A low transverse uterine incision was made with the scalpel and extended bilaterally with the bandage scissors.  The infant was delivered in vertex position without difficulty.  After the umbilical cord was clamped and cut, the infant was handed to the awaiting pediatricians.  Cord blood was obtained for evaluation.  The placenta was removed intact and appeared to be within normal limits. The uterus was cleared of all clots and debris. The uterine incision was closed with running interlocking sutures of 0 Vicryl and a second imbricating layer was performed as well.   Bilateral tubes and ovaries appeared to be within normal limits.  Good hemostasis was noted.  Copious irrigation was performed until clear.  The peritoneum was repaired with 2-0 chromic via a running suture.  The fascia was reapproximated with a running suture of 0 Vicryl. The subcutaneous tissue was reapproximated with 3 interrupted sutures of 2-0 plain.  The skin was reapproximated with a subcuticular suture of 3-0 monocryl.  Steristrips were applied.  Instrument, sponge, and needle counts were correct prior to abdominal closure and at the conclusion of the case.  The patient was awaiting transfer to the recovery room in good condition.  Findings: Live female infant with Apgars 8 at one minute and 9 at five minutes.  Normal appearing bilateral ovaries and fallopian tubes were noted.  Estimated Blood Loss:  800 ml         Drains: foley to gravity 100 cc         Total IV Fluids: 3400 ml         Specimens to Pathology: Placenta         Complications:  None; patient tolerated the procedure well.         Disposition: PACU - hemodynamically stable.  Condition: stable  Attending Attestation: I performed the procedure.

## 2015-07-09 NOTE — Anesthesia Postprocedure Evaluation (Signed)
  Anesthesia Post-op Note  Patient: Tina [name]  Procedure(s) Performed: Procedure(s) (LRB): CESAREAN SECTION (N/A)  Patient Location: PACU  Anesthesia Type: Epidural  Level of Consciousness: awake and alert   Airway and Oxygen Therapy: Patient Spontanous Breathing  Post-op Pain: mild  Post-op Assessment: Post-op Vital signs reviewed, Patient's Cardiovascular Status Stable, Respiratory Function Stable, Patent Airway and No signs of Nausea or vomiting  Last Vitals:  Filed Vitals:   07/09/15 1010  BP: 115/58  Pulse: 88  Temp: 36.9 C  Resp: 18    Post-op Vital Signs: stable   Complications: No apparent anesthesia complications

## 2015-07-09 NOTE — Lactation Note (Signed)
This note was copied from the chart of Tina Kerby NoraCristina Staup. Lactation Consultation Note; Lactation brochure given with basic teaching done. [facility]. She is concerned that infant has not latched and  just holds the nipple in his mouth.  Advised mother in need to continue to keep infant STS and offer breast with feeding cues. Reviewed Baby and me book . Mother has inverted nipples. Nipples firm and evert well with stimulation. Infant latched with good depth for 18 mins. Observed multiple swallows. Mother advised to rouse infant is sleeping longer than 3 hours. Mother receptive to all teaching.   Patient Name: Tina [name] NFAOZ'HToday's Date: 07/09/2015 Reason for consult: Initial assessment   Maternal Data Has patient been taught Hand Expression?: Yes Does the patient have breastfeeding experience prior to this delivery?: No  Feeding    LATCH Score/Interventions                      Lactation Tools Discussed/Used     Consult Status Consult Status: Follow-up    Stevan BornKendrick, Fowler Antos [facility] 07/09/2015, 12:09 PM

## 2015-07-09 NOTE — Progress Notes (Addendum)
Labor Progress  Subjective: Comfortable, in high fowlers  Objective: BP 108/66 mmHg  Pulse 104  Temp(Src) 98.7 F (37.1 C) (Oral)  Resp 18  Ht 5\' 2"  (1.575 m)  Wt 243 lb (110.224 kg)  BMI 44.43 kg/m2  SpO2 100%  LMP 10/14/2014 (Exact Date)     FHT:  120, moderate variability, + accel, 0258 Decel x 5 minutes CTX:  regular, every 2-3 minutes Uterus gravid, soft non tender SVE:  Dilation: 8.5 Effacement (%): 80 Station: 0 Exam by:: Vstandard, CNM  Assessment:  IUP at 38.2 weeks NICHD: Category 1 in high fowlers,  Category 2 with a change in position Active intrauterine resuscitative measures con't Membranes:  ROM x 3hrs, no s/s of infection Labor progress: transition GBS: positive Epidural   Plan: Continue labor plan Continuous monitoring Pt to remain in high fowlers Anticipate a vaginal delivery Dr Su Hiltoberts called to the bedside     Tylisha Danis, CNM, MSN 07/09/2015. 2:11 AM

## 2015-07-09 NOTE — Progress Notes (Signed)
Tina [name] is a 24 [name].o. G3P0110 at 2162w2d   Subjective: C/o back pain  Objective: BP 123/70 mmHg  Pulse 79  Temp(Src) 98 F (36.7 C) (Axillary)  Resp 18  Ht 5\' 2"  (1.575 m)  Wt 110.224 kg (243 lb)  BMI 44.43 kg/m2  SpO2 98%  LMP 10/14/2014 (Exact Date)     FHT:  FHR: 110s bpm, variability: marked,  accelerations:  Present,  decelerations:  Present with decreasing baseline. UC:   regular, every 3 minutes SVE:   Dilation: Lip/rim Effacement (%): 80 Station: +1 Exam by:: Vstandard, CNM AL/90/0 Labs: Lab Results  Component Value Date   WBC 14.0* 07/08/2015   HGB 13.7 07/08/2015   HCT 39.3 07/08/2015   MCV 92.9 07/08/2015   PLT 204 07/08/2015    Assessment / Plan: Spontaneous labor, progressing normally.  Pt has had episodes of hyperstimulation.  Labor: Progressing normally Preeclampsia:  no signs or symptoms of toxicity Fetal Wellbeing:  cat 1 now but has runs of prolonged decels and in light of the marked variability with decreasing baseline and slow recovery, I discussed my recommendation for c/s and pt is agreeable. Pain Control:  Epidural I/D:  on PCN Anticipated MOD:  c/s  Tina [name] 07/09/2015, 5:58 AM

## 2015-07-09 NOTE — Progress Notes (Signed)
Tina NoraCristina [name] is a 24 y.o. G3P0110 at 2244w2d admitted for active labor  Subjective: No complaints  Objective: BP 108/66 mmHg  Pulse 104  Temp(Src) 98.7 F (37.1 C) (Oral)  Resp 18  Ht 5\' 2"  (1.575 m)  Wt 110.224 kg (243 lb)  BMI 44.43 kg/m2  SpO2 100%  LMP 10/14/2014 (Exact Date)      FHT:  FHR: 120s bpm, variability: moderate,  accelerations:  Present,  decelerations:  Absent UC:   regular, every 2-3 minutes SVE:   Dilation: 8.5 Effacement (%): 80 Station: 0 Exam by:: Vstandard, CNM  Labs: Lab Results  Component Value Date   WBC 14.0* 07/08/2015   HGB 13.7 07/08/2015   HCT 39.3 07/08/2015   MCV 92.9 07/08/2015   PLT 204 07/08/2015    Assessment / Plan: Spontaneous labor, progressing normally.  Labor: Progressing normally and however tracing only looks good while pt is sitting up in the bed otherwise there are recurrent and/or prolonged decels.  While sitting up tracing is cat 1.   Preeclampsia:  no signs or symptoms of toxicity Fetal Wellbeing:  Category I and reassuring currently but has episodes of decels that occur when flat for exam. She is progressing nicely though. Pain Control:  Epidural I/D:  on pcn for GBS + Anticipated MOD:  observing closely  Carie Kapuscinski Y 07/09/2015, 2:30 AM

## 2015-07-09 NOTE — Progress Notes (Signed)
Labor Progress  Subjective: Ok, sitting in high fowlers  Objective: BP 123/70 mmHg  Pulse 79  Temp(Src) 98 F (36.7 C) (Axillary)  Resp 18  Ht 5\' 2"  (1.575 m)  Wt 243 lb (110.224 kg)  BMI 44.43 kg/m2  SpO2 98%  LMP 10/14/2014 (Exact Date)     FHT:115, moderate-marked variability, + accel, no decels CTX:  regular, every 3 -4 minutes Uterus gravid, soft non tender SVE:  Dilation: Lip/rim Effacement (%): 80 Station: +1 Exam by:: Vstandard, CNM  Assessment:  IUP at 38.2 weeks NICHD: Category 1 Membranes:  ROM x 6hrs, no s/s of infection Labor progress: transition GBS: positive Pt in high fowlers    Plan: Continue labor plan Continuous monitoring Rest Will reassess with cervical exam at 0630 or earlier if necessary Remain in high fowlers Dr Su Hiltoberts aware and available     Tina [name], CNM, MSN 07/09/2015. 4:44 AM

## 2015-07-09 NOTE — Addendum Note (Signed)
Addendum  created 07/09/15 1314 by Shanon PayorSuzanne M Jung Ingerson, CRNA   Modules edited: Notes Section   Notes Section:  File: [id number]

## 2015-07-09 NOTE — Transfer of Care (Signed)
Immediate Anesthesia Transfer of Care Note  Patient: Tina [name]  Procedure(s) Performed: Procedure(s): CESAREAN SECTION (N/A)  Patient Location: PACU  Anesthesia Type:Epidural  Level of Consciousness: awake, alert  and oriented  Airway & Oxygen Therapy: Patient Spontanous Breathing  Post-op Assessment: Report given to RN and Post -op Vital signs reviewed and stable  Post vital signs: Reviewed and stable  Last Vitals:  Filed Vitals:   07/09/15 0531  BP: 134/78  Pulse: 90  Temp:   Resp:     Complications: No apparent anesthesia complications

## 2015-07-09 NOTE — Anesthesia Postprocedure Evaluation (Signed)
  Anesthesia Post-op Note  Patient: Tina [name]  Procedure(s) Performed: Procedure(s): CESAREAN SECTION (N/A)  Patient Location: Mother/Baby  Anesthesia Type:Epidural  Level of Consciousness: awake, alert  and oriented  Airway and Oxygen Therapy: Patient Spontanous Breathing  Post-op Pain: none  Post-op Assessment: Post-op Vital signs reviewed, Patient's Cardiovascular Status Stable, Respiratory Function Stable, Patent Airway, No signs of Nausea or vomiting, Adequate PO intake, Pain level controlled, No headache, No backache, Spinal receding and Patient able to bend at knees LLE Motor Response: Purposeful movement  Post-op Vital Signs: Reviewed and stable  Last Vitals:  Filed Vitals:   07/09/15 1010  BP: 115/58  Pulse: 88  Temp: 36.9 C  Resp: 18    Complications: No apparent anesthesia complications

## 2015-07-10 ENCOUNTER — Encounter (HOSPITAL_COMMUNITY): Payer: Self-pay | Admitting: Obstetrics and Gynecology

## 2015-07-10 LAB — CBC
HEMATOCRIT: 31.8 % — AB (ref 36.0–46.0)
HEMOGLOBIN: 10.6 g/dL — AB (ref 12.0–15.0)
MCH: 31.5 pg (ref 26.0–34.0)
MCHC: 33.3 g/dL (ref 30.0–36.0)
MCV: 94.6 fL (ref 78.0–100.0)
PLATELETS: 155 10*3/uL (ref 150–400)
RBC: 3.36 MIL/uL — ABNORMAL LOW (ref 3.87–5.11)
RDW: 14.4 % (ref 11.5–15.5)
WBC: 15.2 10*3/uL — AB (ref 4.0–10.5)

## 2015-07-10 LAB — BIRTH TISSUE RECOVERY COLLECTION (PLACENTA DONATION)

## 2015-07-10 NOTE — Lactation Note (Signed)
This note was copied from the chart of Tina Tina NoraCristina Caponi. Lactation Consultation Note Follow up visit at 35 hours of age.  Baby is 5#8oz.  Baby has had 8 feedings in the past 24 hours with a very long break in between.  Baby has had 5 stools and no void in the past 24 hours. Baby is borderline high risk for jaundice and phototherapy.  Baby latched on left breast for about 20 minutes with bursts of swallows and then needing stimulation.  After feeding LC gave baby 2mls of colostrum by foley cup and baby did not continue to show cues for any more supplementation.  Mom using DEBP on preemie setting every 3 hours for minimum of 8 X in 24 hours.  Set up with instruction on cleaning and frequency.  Mom will hand  Express after pumping to collect and give back to baby. Discussed limiting feedings to about 15-20 at breast and supplement every feeding with 7-12 mls of colostrum adding formula as needed.  Mom to call for assist if baby is not waking for feeding or taking full supplementation.  Baby asleep and questions answered.   [facility]  Mom to call for assist as needed.       Patient Name: Tina Tina NoraCristina Saavedra IONGE'XToday's Date: 07/10/2015 Reason for consult: Follow-up assessment;Difficult latch;Infant weight loss;Infant < 6lbs   Maternal Data    Feeding Feeding Type: Breast Fed Length of feed: 20 min  LATCH Score/Interventions Latch: Grasps breast easily, tongue down, lips flanged, rhythmical sucking. Intervention(s): Skin to skin;Teach feeding cues;Waking techniques Intervention(s): Breast compression;Breast massage  Audible Swallowing: A few with stimulation Intervention(s): Skin to skin;Hand expression  Type of Nipple: Everted at rest and after stimulation (everted with hand pump) Intervention(s): No intervention needed  Comfort (Breast/Nipple): Soft / non-tender     Hold (Positioning): Assistance needed to correctly position infant at breast and maintain latch. Intervention(s):  Skin to skin;Position options;Support Pillows;Breastfeeding basics reviewed  LATCH Score: 8  Lactation Tools Discussed/Used Tools: Pump Breast pump type: Double-Electric Breast Pump WIC Program: Yes Pump Review: Setup, frequency, and cleaning Initiated by:: JS Date initiated:: 07/10/15   Consult Status Consult Status: Follow-up Date: 07/11/15 Follow-up type: In-patient    [name], Arvella MerlesJana Lynn 07/10/2015, 6:35 PM

## 2015-07-10 NOTE — Progress Notes (Signed)
Subjective: Postpartum Day 1: Cesarean Delivery due to NRFHT Patient up ad lib, reports no syncope or dizziness. Feeding:  breast Contraceptive plan:  Unsure C/o abd pain in the LRQ,  the same spot during labor C/o incision pain with movement but  Hasn't taken pain meds.  Discussed the benefits, pt agreed  Objective: Vital signs in last 24 hours: Temp:  [98 F (36.7 C)-98.9 F (37.2 C)] 98.3 F (36.8 C) (07/12 0610) Pulse Rate:  [71-92] 71 (07/12 0610) Resp:  [18-20] 20 (07/12 0610) BP: (109-130)/(58-81) 109/59 mmHg (07/12 0610) SpO2:  [96 %-99 %] 98 % (07/12 0610)  Physical Exam:  General: alert and cooperative Lochia: appropriate Uterine Fundus: firm Abdomen:  + bowel sounds, non distended Incision: healing well  Honeycomb dressing CDI DVT Evaluation: No evidence of DVT seen on physical exam. Homan's sign: Negative   Recent Labs  07/08/15 1910 07/10/15 0600  HGB 13.7 10.6*  HCT 39.3 31.8*  WBC 14.0* 15.2*   Orthostatics stable.  Assessment: Status post Cesarean section day 1. Doing well postoperatively.  Honeycomb dressing in place, no significant drainage Anemia - hemodynamicly stable.    Plan: Continue current care. Breastfeeding and Lactation consult Percocet as prescribed per pt request   Tina [name], CNM, MSN 07/10/2015. 8:56 AM

## 2015-07-10 NOTE — Progress Notes (Signed)
Pt states that she really needs to void but unable to. Foley was dc at 2130. Bladder scan done showing 714ml in bladder. In and out cath done with a result of 1100ml emptied from bladder. Pt much more comfortable.

## 2015-07-11 NOTE — Lactation Note (Signed)
This note was copied from the chart of Tina [name]. Lactation Consultation Note  Patient Name: Tina [name] AVWUJ'WToday's Date: 07/11/2015 Reason for consult: Follow-up assessment;Hyperbilirubinemia Assisted Mom with positioning and obtaining depth with latch while baby under bili blanket. Baby demonstrated a good rhythmic suck with some swallows noted. Plan discussed with Mom is to BF 8-12 times or more in 24 hours and with feeding ques. Reviewed how to keep baby awake/engaged at the breast if baby sleepy. Advised to BF 1 breast each feeding for 30 minutes, post pump both breasts for 15 minutes on preemie setting every 3 hours and continue supplements giving 20 ml with each feeding today. With parents decided bottle was better to supplement so baby can stay on bili blankets. FOB very involved/helpful and is giving the supplements while Mom post pumps. Alternate each feeding which breast Mom offers baby. Call for assist as needed.   Maternal Data    Feeding Feeding Type: Breast Fed Length of feed: 20 min  LATCH Score/Interventions Latch: Grasps breast easily, tongue down, lips flanged, rhythmical sucking. Intervention(s): Adjust position;Assist with latch;Breast massage;Breast compression  Audible Swallowing: Spontaneous and intermittent  Type of Nipple: Everted at rest and after stimulation  Comfort (Breast/Nipple): Soft / non-tender     Hold (Positioning): Assistance needed to correctly position infant at breast and maintain latch. Intervention(s): Breastfeeding basics reviewed;Support Pillows;Position options;Skin to skin  LATCH Score: 9  Lactation Tools Discussed/Used Tools: Pump Breast pump type: Double-Electric Breast Pump   Consult Status Consult Status: Follow-up Date: 07/12/15 Follow-up type: In-patient    Tina [name], Tina [name] 07/11/2015, 2:20 PM

## 2015-07-11 NOTE — Progress Notes (Signed)
Subjective: Postpartum Day 2: Cesarean Delivery due to NRFHT Patient up ad lib, reports no syncope or dizziness. Feeding:  breastfeeding Contraceptive plan:  unsure  Objective: Vital signs in last 24 hours: Temp:  [98.2 F (36.8 C)-98.3 F (36.8 C)] 98.3 F (36.8 C) (07/13 0630) Pulse Rate:  [65-71] 71 (07/13 0630) Resp:  [18-20] 18 (07/13 0630) BP: (118-130)/(60-65) 130/65 mmHg (07/13 0630) SpO2:  [100 %] 100 % (07/13 0630)  Physical Exam:  General: alert and cooperative Lochia: appropriate Uterine Fundus: firm Abdomen:  + bowel sounds, non distended Incision: no significant drainage  Honeycomb dressing CDI DVT Evaluation: No evidence of DVT seen on physical exam. Homan's sign: Negative   Recent Labs  07/08/15 1910 07/10/15 0600  HGB 13.7 10.6*  HCT 39.3 31.8*  WBC 14.0* 15.2*    Assessment: Status post Cesarean section day 2. Doing well postoperatively.  Honeycomb dressing in place, no significant drainage Anemia - hemodynamicly stable.   Plan: Continue current care. Plan for discharge tomorrow and Breastfeeding    Manaal Mandala, CNM, MSN 07/11/2015. 8:17 AM

## 2015-07-11 NOTE — Lactation Note (Signed)
This note was copied from the chart of Boy Kerby NoraCristina Cottier. Lactation Consultation Note  Patient Name: Boy Kerby NoraCristina Sissel ZOXWR'UToday's Date: 07/11/2015 Reason for consult: Follow-up assessment;Hyperbilirubinemia Baby is now under double phototherapy. Mom reports baby is latching but not sustaining the latch for long periods. She has not pumped since yesterday. Parents are supplementing via bottle. Assisted Mom with pumping to start a schedule of every 3 hours for 15 minutes on preemie setting. Encouraged to consider other method of supplementing besides bottle, parents receptive. LC left phone number for Mom to call with next feeding for assist with latch now that baby is on bili blankets. Reviewed supplemental guidelines with parents, advised to continue supplements with feedings. Mom to call.   Maternal Data    Feeding Feeding Type: Formula Length of feed: 15 min  LATCH Score/Interventions                      Lactation Tools Discussed/Used Tools: Pump Breast pump type: Double-Electric Breast Pump   Consult Status Consult Status: Follow-up Date: 07/11/15 Follow-up type: In-patient    [name], Cynara Tatham Ann 07/11/2015, 11:10 AM

## 2015-07-12 MED ORDER — MEASLES, MUMPS & RUBELLA VAC ~~LOC~~ INJ
0.5000 mL | INJECTION | Freq: Once | SUBCUTANEOUS | Status: AC
  Administered 2015-07-12: 0.5 mL via SUBCUTANEOUS
  Filled 2015-07-12: qty 0.5

## 2015-07-12 MED ORDER — OXYCODONE-ACETAMINOPHEN 5-325 MG PO TABS: 1.0000 | ORAL_TABLET | ORAL | Status: DC | PRN

## 2015-07-12 MED ORDER — IBUPROFEN 600 MG PO TABS: 600.0000 mg | ORAL_TABLET | Freq: Four times a day (QID) | ORAL | Status: DC | PRN

## 2015-07-12 NOTE — Discharge Summary (Signed)
  Cesarean Section Delivery Discharge Summary  Tina [name]  DOB:    1991-10-03 MRN:    [id number] CSN:    [id number]  Date of admission:                  07/08/15  Date of discharge:                   07/12/15  Procedures this admission:  Primary LTCS, epidural  Date of Delivery: 07/09/15  Newborn Data:  Live born female  Birth Weight: 5 lb 12.1 oz (2610 g) APGAR: 7, 8  Currently under bili lights at mom's bedside--d/c plan pending bili levels  Circumcision Plan: Outpatient, if does at all  History of Present Illness:  [name] is a 24 y.o. female, [phone], who presents at 2967w2d weeks gestation. [facility]   Her pregnancy has been complicated by:  Patient Active Problem List   Diagnosis Date Noted  . Status post primary low transverse cesarean section 07/09/2015  . Preterm delivery--2015, 22 weeks, iufd 07/08/2015  . Positive GBS test 07/08/2015  . Rubella non-immune status, antepartum 07/08/2015  . Spontaneous abortion in second trimester 03/24/2014  . Antepartum placental abruption in second trimester 03/12/2014    Hospital Course--Unscheduled:  Admitting Dx:  IUP at 38 2/7 weeks, early labor, positive GBS, rubella non-immune Rationale for C/S: Fetal intolerance to labor Anesthesia:  Epidural Surgeon:  Dr. Su Hiltoberts Complications: None   Feeding:  breast  Contraception:  Declines at present  Hemoglobin Results:  CBC Latest Ref Rng 07/10/2015 07/08/2015 12/07/2014  WBC 4.0 - 10.5 K/uL 15.2(H) 14.0(H) 9.2  Hemoglobin 12.0 - 15.0 g/dL 10.6(L) 13.7 12.3  Hematocrit 36.0 - 46.0 % 31.8(L) 39.3 35.3(L)  Platelets 150 - 400 K/uL 155 204 235     Discharge Physical Exam:   General: alert Lochia: appropriate Uterine Fundus: firm Abdomen:  + bowel sounds Incision: Honeycomb dressing with old drainage marked DVT Evaluation: No evidence of DVT  seen on physical exam. Negative Homan's sign.  Intrapartum Procedures: cesarean: low cervical, transverse due to fetal intolerance to labor Postpartum Procedures: none and Rubella Ig offered Complications-Operative and Postpartum: none  Discharge Diagnoses: Term Pregnancy-delivered, fetal intolerance to labor, primary LTCS  Discharge Information:  Activity:           pelvic rest Diet:                routine Medications: Ibuprofen and Percocet Condition:      stable Instructions: PP d/c  Discharge to: home  Follow-up Information    [facility] In 6 weeks.   Specialty:  Obstetrics and Gynecology   Why:  Call for any questions or concerns.   Contact information:   [address] [place] [phone]       Nigel BridgemanLATHAM, Lennox Leikam [facility] Center[facility] 07/12/2015 8:05 AM

## 2015-07-12 NOTE — Discharge Instructions (Signed)

## 2015-12-30 NOTE — L&D Delivery Note (Addendum)
Delivery Note At 2:50 AM, on Oct. 24, 2017, a viable female "Tina [name]" was delivered via VBAC, Spontaneous Delivery (Presentation: Right Occiput Anterior with restitution to ROT ).   After delivery of head, nuchal cord noted that shoulders and body was delivered through via somersault maneuver. Infant with good tone and spontaneous cry and tactile stimulation given by provider. Infant placed on mother's abdomen where nurse continued tactile stimulation. Infant APGAR: 8, 9. Cord clamped, cut, and blood collected. Placenta delivered spontaneously and noted to be intact with 3VC upon inspection.  Patient opts to take placenta home.  Vaginal inspection revealed a 2nd degree perineal laceration that was repaired with 3-0 vicryl on CT-1. The patient also had 2 periurethral lacerations one of which was hemostatic and required no repair.  Additional anesthetic (1% lidocaine) was necessary and patient tolerated the procedure well. Fundus firm, below the umbilicus, and bleeding small.  Mother hemodynamically stable and infant skin to skin prior to provider exit.  Mother desires birth control, but is unsure of method.  Mother opts to breast and bottlefeed.  Infant measurements at one hour of life: 6lbs 11.4oz, 18.5in  Patient with PC Ratio of 0.29 which was discussed with Dr. Katharine Look who advises observation at current.   Anesthesia: Epidural and Local for Repair Episiotomy: None Lacerations: 2nd degree;Periurethral Suture Repair: 3.0 vicryl Est. Blood Loss (mL): 250  Mom to postpartum.  Baby to Couplet care / Skin to Skin.  Cherre Robins MSN, CNM 10/21/2016, 3:39 AM

## 2016-01-20 ENCOUNTER — Emergency Department (HOSPITAL_COMMUNITY)
Admission: EM | Admit: 2016-01-20 | Discharge: 2016-01-20 | Disposition: A | Discharge status: Home / Self Care | Payer: Medicaid Other | Attending: Emergency Medicine | Admitting: Emergency Medicine

## 2016-01-20 ENCOUNTER — Emergency Department (HOSPITAL_COMMUNITY): Payer: Medicaid Other

## 2016-01-20 ENCOUNTER — Encounter (HOSPITAL_COMMUNITY): Payer: Self-pay | Admitting: Emergency Medicine

## 2016-01-20 DIAGNOSIS — J189 Pneumonia, unspecified organism: Secondary | ICD-10-CM

## 2016-01-20 DIAGNOSIS — J069 Acute upper respiratory infection, unspecified: Secondary | ICD-10-CM

## 2016-01-20 DIAGNOSIS — D72829 Elevated white blood cell count, unspecified: Secondary | ICD-10-CM | POA: Insufficient documentation

## 2016-01-20 DIAGNOSIS — Z79899 Other long term (current) drug therapy: Secondary | ICD-10-CM | POA: Insufficient documentation

## 2016-01-20 LAB — BASIC METABOLIC PANEL
ANION GAP: 8 (ref 5–15)
BUN: 6 mg/dL (ref 6–20)
CO2: 28 mmol/L (ref 22–32)
CREATININE: 0.49 mg/dL (ref 0.44–1.00)
Calcium: 9.5 mg/dL (ref 8.9–10.3)
Chloride: 105 mmol/L (ref 101–111)
GFR calc non Af Amer: >60 mL/min (ref >60)
Glucose, Bld: 105 mg/dL — ABNORMAL HIGH (ref 65–99)
Potassium: 4.1 mmol/L (ref 3.5–5.1)
Sodium: 141 mmol/L (ref 135–145)

## 2016-01-20 LAB — CBC
HEMATOCRIT: 40.1 % (ref 36.0–46.0)
HEMOGLOBIN: 13.7 g/dL (ref 12.0–15.0)
MCH: 31.2 pg (ref 26.0–34.0)
MCHC: 34.2 g/dL (ref 30.0–36.0)
MCV: 91.3 fL (ref 78.0–100.0)
Platelets: 272 10*3/uL (ref 150–400)
RBC: 4.39 MIL/uL (ref 3.87–5.11)
RDW: 13.3 % (ref 11.5–15.5)
WBC: 13.1 10*3/uL — ABNORMAL HIGH (ref 4.0–10.5)

## 2016-01-20 LAB — I-STAT TROPONIN, ED: Troponin i, poc: 0 ng/mL (ref 0.00–0.08)

## 2016-01-20 MED ORDER — AZITHROMYCIN 250 MG PO TABS: 250.0000 mg | ORAL_TABLET | Freq: Every day | ORAL | Status: DC

## 2016-01-20 NOTE — ED Notes (Signed)
Cold cough for one week  Productive cough dark brown.  Unknown if temp

## 2016-01-20 NOTE — ED Notes (Signed)
See notes from triage and the pa.  Pt gone to xray before i saw.

## 2016-01-20 NOTE — ED Notes (Addendum)
C/o intermittent pain to center of chest x 2 days with sob, nausea, and vomiting (X3 today).  Also reports productive cough with brown sputum and nasal congestion x 1 week.

## 2016-01-20 NOTE — Discharge Instructions (Signed)
Cough, Adult Coughing is a reflex that clears your throat and your airways. Coughing helps to heal and protect your lungs. It is normal to cough occasionally, but a cough that happens with other symptoms or lasts a long time may be a sign of a condition that needs treatment. A cough may last only 2-3 weeks (acute), or it may last longer than 8 weeks (chronic). CAUSES Coughing is commonly caused by:  Breathing in substances that irritate your lungs.  A viral or bacterial respiratory infection.  Allergies.  Asthma.  Postnasal drip.  Smoking.  Acid backing up from the stomach into the esophagus (gastroesophageal reflux).  Certain medicines.  Chronic lung problems, including COPD (or rarely, lung cancer).  Other medical conditions such as heart failure. HOME CARE INSTRUCTIONS  Pay attention to any changes in your symptoms. Take these actions to help with your discomfort:  Take medicines only as told by your health care provider.  If you were prescribed an antibiotic medicine, take it as told by your health care provider. Do not stop taking the antibiotic even if you start to feel better.  Talk with your health care provider before you take a cough suppressant medicine.  Drink enough fluid to keep your urine clear or pale yellow.  If the air is dry, use a cold steam vaporizer or humidifier in your bedroom or your home to help loosen secretions.  Avoid anything that causes you to cough at work or at home.  If your cough is worse at night, try sleeping in a semi-upright position.  Avoid cigarette smoke. If you smoke, quit smoking. If you need help quitting, ask your health care provider.  Avoid caffeine.  Avoid alcohol.  Rest as needed. SEEK MEDICAL CARE IF:   You have new symptoms.  You cough up pus.  Your cough does not get better after 2-3 weeks, or your cough gets worse.  You cannot control your cough with suppressant medicines and you are losing sleep.  You  develop pain that is getting worse or pain that is not controlled with pain medicines.  You have a fever.  You have unexplained weight loss.  You have night sweats. SEEK IMMEDIATE MEDICAL CARE IF:  You cough up blood.  You have difficulty breathing.  Your heartbeat is very fast.   This information is not intended to replace advice given to you by your health care provider. Make sure you discuss any questions you have with your health care provider.   Document Released: 06/13/2011 Document Revised: 09/05/2015 Document Reviewed: 02/21/2015 Elsevier Interactive Patient Education 2016 [employer]  Community-Acquired Pneumonia, Adult Pneumonia is an infection of the lungs. One type of pneumonia can happen while a person is in a hospital. A different type can happen when a person is not in a hospital (community-acquired pneumonia). It is easy for this kind to spread from person to person. It can spread to you if you breathe near an infected person who coughs or sneezes. Some symptoms include:  A dry cough.  A wet (productive) cough.  Fever.  Sweating.  Chest pain. HOME CARE  Take over-the-counter and prescription medicines only as told by your doctor.  Only take cough medicine if you are losing sleep.  If you were prescribed an antibiotic medicine, take it as told by your doctor. Do not stop taking the antibiotic even if you start to feel better.  Sleep with your head and neck raised (elevated). You can do this by putting a few pillows  under your head, or you can sleep in a recliner.  Do not use tobacco products. These include cigarettes, chewing tobacco, and e-cigarettes. If you need help quitting, ask your doctor.  Drink enough water to keep your pee (urine) clear or pale yellow. A shot (vaccine) can help prevent pneumonia. Shots are often suggested for:  People older than 26 years of age.  People older than 26 years of age:  Who are having cancer treatment.  Who  have long-term (chronic) lung disease.  Who have problems with their body's defense system (immune system). You may also prevent pneumonia if you take these actions:  Get the flu (influenza) shot every year.  Go to the dentist as often as told.  Wash your hands often. If soap and water are not available, use hand sanitizer. GET HELP IF:  You have a fever.  You lose sleep because your cough medicine does not help. GET HELP RIGHT AWAY IF:  You are short of breath and it gets worse.  You have more chest pain.  Your sickness gets worse. This is very serious if:  You are an older adult.  Your body's defense system is weak.  You cough up blood.   This information is not intended to replace advice given to you by your health care provider. Make sure you discuss any questions you have with your health care provider.   Follow-up with her primary care provider if symptoms do not improve. Take antibiotics as prescribed. Take ibuprofen as needed for fever and generalized body aches. Return to the emergency department if you experience severe worsening of her symptoms, difficulty breathing, chest pain, difficulty swallowing and a fever.

## 2016-01-23 NOTE — ED Provider Notes (Signed)
CSN: [id number]     Arrival date & time 01/20/16  1547 History   First MD Initiated Contact with Patient 01/20/16 1749     Chief Complaint  Patient presents with  . Chest Pain  . Cough  . Nasal Congestion     (Consider location/radiation/quality/duration/timing/severity/associated sxs/prior Treatment) HPI   Tina [name] is a 25 y.o F with no significant pmhx who presents to the ED today c/o cough, sore throat onset 5 days ago. Pt states that 5 days age she began having a productive cough with yellow/brown sputum. Pt has associated sore throat, hoarseness, subjective fevers and intermittent SOB. Pt also reports chest pain with coughing, nasal congestion and post-tussive emesis. No sick contacts. No dysphagia. No recent travel. Pt has not taken anything for her symptoms. Denies dizziness, syncope, paresthesias, HA, abdominal pain, dysuria.   Past Medical History  Diagnosis Date  . Medical history non-contributory    Past Surgical History  Procedure Laterality Date  . No past surgeries    . Extraction of wisdom    . Cesarean section N/A 07/09/2015    Procedure: CESAREAN SECTION;  Surgeon: Osborn Coho, MD;  Location: [facility];  Service: Obstetrics;  Laterality: N/A;   Family History  Problem Relation Age of Onset  . Arthritis Neg Hx   . Asthma Neg Hx   . Birth defects Neg Hx   . Depression Neg Hx   . Drug abuse Neg Hx   . Early death Neg Hx   . Hearing loss Neg Hx   . Heart disease Neg Hx   . Hyperlipidemia Neg Hx   . Hypertension Neg Hx   . Kidney disease Neg Hx   . Learning disabilities Neg Hx   . Mental illness Neg Hx   . Mental retardation Neg Hx   . Miscarriages / Stillbirths Neg Hx   . Stroke Neg Hx   . Vision loss Neg Hx   . Varicose Veins Neg Hx   . Diabetes Paternal Aunt   . Alcohol abuse Maternal Grandfather   . COPD Maternal Grandfather   . Diabetes Paternal Grandmother    Social History  Substance Use Topics  . Smoking status: Never Smoker   .  Smokeless tobacco: Never Used  . Alcohol Use: No   OB History    Gravida Para Term Preterm AB TAB SAB Ectopic Multiple Living   0 1     Review of Systems  All other systems reviewed and are negative.     Allergies  Review of patient's allergies indicates no known allergies.  Home Medications   Prior to Admission medications   Medication Sig Start Date End Date Taking? Authorizing Provider  acetaminophen (TYLENOL) 500 MG tablet Take 500 mg by mouth every 6 (six) hours as needed for mild pain.    Historical Provider, MD  azithromycin (ZITHROMAX) 250 MG tablet Take 1 tablet (250 mg total) by mouth daily. Take first 2 tablets together, then 1 every day until finished. 01/20/16   Penda Venturi Tripp Tanza Pellot, PA-C  ibuprofen (ADVIL,MOTRIN) 600 MG tablet Take 1 tablet (600 mg total) by mouth every 6 (six) hours as needed. 07/12/15   Nigel Bridgeman, CNM  oxyCODONE-acetaminophen (PERCOCET/ROXICET) 5-325 MG per tablet Take 1 tablet by mouth every 4 (four) hours as needed (for pain scale 4-7). 07/12/15   Nigel Bridgeman, CNM  Prenatal Vit-Fe Fumarate-FA (PRENATAL MULTIVITAMIN) TABS tablet Take 1 tablet by mouth daily.    Historical Provider,  MD   BP 120/73 mmHg  Pulse 88  Temp(Src) 99.1 F (37.3 C) (Oral)  Resp 18  Ht  (1.575 m)  Wt 98.975 kg  BMI 39.90 kg/m2  SpO2 100%  LMP 01/20/2016  Breastfeeding? Yes Physical Exam  Constitutional: She is oriented to person, place, and time. She appears well-developed and well-nourished. No distress.  HENT:  Head: Normocephalic and atraumatic.  Right Ear: Tympanic membrane normal.  Left Ear: Tympanic membrane normal.  Nose: Rhinorrhea present. Right sinus exhibits no maxillary sinus tenderness and no frontal sinus tenderness. Left sinus exhibits no maxillary sinus tenderness and no frontal sinus tenderness.  Mouth/Throat: Uvula is midline and mucous membranes are normal. No trismus in the jaw. No uvula swelling. No oropharyngeal exudate,  posterior oropharyngeal edema, posterior oropharyngeal erythema or tonsillar abscesses.  Eyes: Conjunctivae and EOM are normal. Pupils are equal, round, and reactive to light. Right eye exhibits no discharge. Left eye exhibits no discharge. No scleral icterus.  Neck: Normal range of motion. Neck supple.  No meningismus.  Cardiovascular: Normal rate, regular rhythm, normal heart sounds and intact distal pulses.  Exam reveals no gallop and no friction rub.   No murmur heard. Pulmonary/Chest: Effort normal and breath sounds normal. No accessory muscle usage. No respiratory distress. She has no decreased breath sounds. She has no wheezes. She has no rhonchi. She has no rales. She exhibits no tenderness.  Abdominal: Soft. She exhibits no distension. There is no tenderness. There is no guarding.  Musculoskeletal: Normal range of motion. She exhibits no edema.  Lymphadenopathy:    She has no cervical adenopathy.  Neurological: She is alert and oriented to person, place, and time. No cranial nerve deficit.  Strength 5/5 throughout. No sensory deficits.  No gait abnormality.   Skin: Skin is warm and dry. No rash noted. She is not diaphoretic. No erythema. No pallor.  Psychiatric: She has a normal mood and affect. Her behavior is normal.  Nursing note and vitals reviewed.   ED Course  Procedures (including critical care time) Labs Review Labs Reviewed  BASIC METABOLIC PANEL - Abnormal; Notable for the following:    Glucose, Bld 105 (*)    All other components within normal limits  CBC - Abnormal; Notable for the following:    WBC 13.1 (*)    All other components within normal limits  I-STAT TROPOININ, ED    Imaging Review No results found. I have personally reviewed and evaluated these images and lab results as part of my medical decision-making.   EKG Interpretation   Date/Time:  Sunday January 20 2016 16:42:37 EST Ventricular Rate:  88 PR Interval:  168 QRS Duration: 72 QT  Interval:  346 QTC Calculation: 418 R Axis:   48 Text Interpretation:  Normal sinus rhythm Normal ECG ED PHYSICIAN  INTERPRETATION AVAILABLE IN [facility] Confirmed by TEST, Record  ([id number]) on 01/21/2016 6:41:29 AM      MDM   Final diagnoses:  URI (upper respiratory infection)  Atypical pneumonia    Otherwise healthy 25 y.o F presents with URI sx onset 5 days ago. Pt is afebrile in ED. CXR reveals Mild increased interstitial markings which might reflect viral/atypical respiratory infection. Leukocytosis present, WBC 13.1. Given these findings and clinical history, will treat with abx to cover atypicals. Pt given prescription for Z-pak. Pt is not ill appearing, immunocompromised, and does not have multiple co morbidities, therefore I feel like the they can be treated as an OP with abx therapy. Pt has  been advised to return to the ED if symptoms worsen or they do not improve. Pt verbalizes understanding and is agreeable with plan.      Lester Kinsman [place], PA-C 01/23/16 1610  Lavera Guise, MD 01/23/16 [phone]

## 2016-03-21 ENCOUNTER — Inpatient Hospital Stay (HOSPITAL_COMMUNITY): Payer: Medicaid Other

## 2016-03-21 ENCOUNTER — Encounter (HOSPITAL_COMMUNITY): Payer: Self-pay | Admitting: *Deleted

## 2016-03-21 ENCOUNTER — Inpatient Hospital Stay (HOSPITAL_COMMUNITY)
Admission: AD | Admit: 2016-03-21 | Discharge: 2016-03-21 | Disposition: A | Discharge status: Home / Self Care | Payer: Medicaid Other | Source: Ambulatory Visit | Attending: Obstetrics & Gynecology | Admitting: Obstetrics & Gynecology

## 2016-03-21 DIAGNOSIS — O26899 Other specified pregnancy related conditions, unspecified trimester: Secondary | ICD-10-CM

## 2016-03-21 DIAGNOSIS — O26851 Spotting complicating pregnancy, first trimester: Secondary | ICD-10-CM | POA: Diagnosis present

## 2016-03-21 DIAGNOSIS — R109 Unspecified abdominal pain: Secondary | ICD-10-CM

## 2016-03-21 DIAGNOSIS — R1030 Lower abdominal pain, unspecified: Secondary | ICD-10-CM | POA: Insufficient documentation

## 2016-03-21 DIAGNOSIS — Z3A08 8 weeks gestation of pregnancy: Secondary | ICD-10-CM | POA: Insufficient documentation

## 2016-03-21 DIAGNOSIS — O9989 Other specified diseases and conditions complicating pregnancy, childbirth and the puerperium: Secondary | ICD-10-CM

## 2016-03-21 DIAGNOSIS — O3680X0 Pregnancy with inconclusive fetal viability, not applicable or unspecified: Secondary | ICD-10-CM

## 2016-03-21 DIAGNOSIS — R58 Hemorrhage, not elsewhere classified: Secondary | ICD-10-CM

## 2016-03-21 DIAGNOSIS — R52 Pain, unspecified: Secondary | ICD-10-CM

## 2016-03-21 LAB — URINALYSIS, ROUTINE W REFLEX MICROSCOPIC
Bilirubin Urine: NEGATIVE
Glucose, UA: NEGATIVE mg/dL
Hgb urine dipstick: NEGATIVE
Ketones, ur: NEGATIVE mg/dL
Leukocytes, UA: NEGATIVE
Nitrite: NEGATIVE
Protein, ur: NEGATIVE mg/dL
Specific Gravity, Urine: 1.015 (ref 1.005–1.030)
pH: 7 (ref 5.0–8.0)

## 2016-03-21 LAB — CBC
HCT: 37.7 % (ref 36.0–46.0)
Hemoglobin: 13 g/dL (ref 12.0–15.0)
MCH: 31.3 pg (ref 26.0–34.0)
MCHC: 34.5 g/dL (ref 30.0–36.0)
MCV: 90.8 fL (ref 78.0–100.0)
Platelets: 238 10*3/uL (ref 150–400)
RBC: 4.15 MIL/uL (ref 3.87–5.11)
RDW: 14.2 % (ref 11.5–15.5)
WBC: 9 10*3/uL (ref 4.0–10.5)

## 2016-03-21 LAB — POCT PREGNANCY, URINE: Preg Test, Ur: POSITIVE — AB

## 2016-03-21 LAB — HCG, QUANTITATIVE, PREGNANCY: hCG, Beta Chain, Quant, S: 71958 m[IU]/mL — ABNORMAL HIGH (ref <5)

## 2016-03-21 MED ORDER — PRENATAL VITAMINS 0.8 MG PO TABS
1.0000 | ORAL_TABLET | Freq: Every day | ORAL | Status: DC
Start: 2016-03-21 — End: 2016-10-21

## 2016-03-21 NOTE — Discharge Instructions (Signed)
Safe Medications in Pregnancy   Acne: Benzoyl Peroxide Salicylic Acid  Backache/Headache: Tylenol: 2 regular strength every 4 hours OR              2 Extra strength every 6 hours  Colds/Coughs/Allergies: Benadryl (alcohol free) 25 mg every 6 hours as needed Breath right strips Claritin Cepacol throat lozenges Chloraseptic throat spray Cold-Eeze- up to three times per day Cough drops, alcohol free Flonase (by prescription only) Guaifenesin Mucinex Robitussin DM (plain only, alcohol free) Saline nasal spray/drops Sudafed (pseudoephedrine) & Actifed ** use only after [redacted] weeks gestation and if you do not have high blood pressure Tylenol Vicks Vaporub Zinc lozenges Zyrtec   Constipation: Colace Ducolax suppositories Fleet enema Glycerin suppositories Metamucil Milk of magnesia Miralax Senokot Smooth move tea  Diarrhea: Kaopectate Imodium A-D  *NO pepto Bismol  Hemorrhoids: Anusol Anusol HC Preparation H Tucks  Indigestion: Tums Maalox Mylanta Zantac  Pepcid  Insomnia: Benadryl (alcohol free) 25mg  every 6 hours as needed Tylenol PM Unisom, no Gelcaps  Leg Cramps: Tums MagGel  Nausea/Vomiting:  Bonine Dramamine Emetrol Ginger extract Sea bands Meclizine  Nausea medication to take during pregnancy:  Unisom (doxylamine succinate 25 mg tablets) Take one tablet daily at bedtime. If symptoms are not adequately controlled, the dose can be increased to a maximum recommended dose of two tablets daily (1/2 tablet in the morning, 1/2 tablet mid-afternoon and one at bedtime). Vitamin B6 100mg  tablets. Take one tablet twice a day (up to 200 mg per day).  Skin Rashes: Aveeno products Benadryl cream or 25mg  every 6 hours as needed Calamine Lotion 1% cortisone cream  Yeast infection: Gyne-lotrimin 7 Monistat 7   **If taking multiple medications, please check labels to avoid duplicating the same active ingredients **take medication as directed on  the label ** Do not exceed 4000 mg of tylenol in 24 hours **Do not take medications that contain aspirin or ibuprofen   First Trimester of Pregnancy The first trimester of pregnancy is from week 1 until the end of week 12 (months 1 through 3). A week after a sperm fertilizes an egg, the egg will implant on the wall of the uterus. This embryo will begin to develop into a baby. Genes from you and your partner are forming the baby. The female genes determine whether the baby is a boy or a girl. At 6-8 weeks, the eyes and face are formed, and the heartbeat can be seen on ultrasound. At the end of 12 weeks, all the baby's organs are formed.  Now that you are pregnant, you will want to do everything you can to have a healthy baby. Two of the most important things are to get good prenatal care and to follow your health care provider's instructions. Prenatal care is all the medical care you receive before the baby's birth. This care will help prevent, find, and treat any problems during the pregnancy and childbirth. BODY CHANGES Your body goes through many changes during pregnancy. The changes vary from woman to woman.   You may gain or lose a couple of pounds at first.  You may feel sick to your stomach (nauseous) and throw up (vomit). If the vomiting is uncontrollable, call your health care provider.  You may tire easily.  You may develop headaches that can be relieved by medicines approved by your health care provider.  You may urinate more often. Painful urination may mean you have a bladder infection.  You may develop heartburn as a result of your pregnancy.  You may develop constipation because certain hormones are causing the muscles that push waste through your intestines to slow down.  You may develop hemorrhoids or swollen, bulging veins (varicose veins).  Your breasts may begin to grow larger and become tender. Your nipples may stick out more, and the tissue that surrounds them (areola)  may become darker.  Your gums may bleed and may be sensitive to brushing and flossing.  Dark spots or blotches (chloasma, mask of pregnancy) may develop on your face. This will likely fade after the baby is born.  Your menstrual periods will stop.  You may have a loss of appetite.  You may develop cravings for certain kinds of food.  You may have changes in your emotions from day to day, such as being excited to be pregnant or being concerned that something may go wrong with the pregnancy and baby.  You may have more vivid and strange dreams.  You may have changes in your hair. These can include thickening of your hair, rapid growth, and changes in texture. Some women also have hair loss during or after pregnancy, or hair that feels dry or thin. Your hair will most likely return to normal after your baby is born. WHAT TO EXPECT AT YOUR PRENATAL VISITS During a routine prenatal visit:  You will be weighed to make sure you and the baby are growing normally.  Your blood pressure will be taken.  Your abdomen will be measured to track your baby's growth.  The fetal heartbeat will be listened to starting around week 10 or 12 of your pregnancy.  Test results from any previous visits will be discussed. Your health care provider may ask you:  How you are feeling.  If you are feeling the baby move.  If you have had any abnormal symptoms, such as leaking fluid, bleeding, severe headaches, or abdominal cramping.  If you are using any tobacco products, including cigarettes, chewing tobacco, and electronic cigarettes.  If you have any questions. Other tests that may be performed during your first trimester include:  Blood tests to find your blood type and to check for the presence of any previous infections. They will also be used to check for low iron levels (anemia) and Rh antibodies. Later in the pregnancy, blood tests for diabetes will be done along with other tests if problems  develop.  Urine tests to check for infections, diabetes, or protein in the urine.  An ultrasound to confirm the proper growth and development of the baby.  An amniocentesis to check for possible genetic problems.  Fetal screens for spina bifida and Down syndrome.  You may need other tests to make sure you and the baby are doing well.  HIV (human immunodeficiency virus) testing. Routine prenatal testing includes screening for HIV, unless you choose not to have this test. HOME CARE INSTRUCTIONS  Medicines  Follow your health care provider's instructions regarding medicine use. Specific medicines may be either safe or unsafe to take during pregnancy.  Take your prenatal vitamins as directed.  If you develop constipation, try taking a stool softener if your health care provider approves. Diet  Eat regular, well-balanced meals. Choose a variety of foods, such as meat or vegetable-based protein, fish, milk and low-fat dairy products, vegetables, fruits, and whole grain breads and cereals. Your health care provider will help you determine the amount of weight gain that is right for you.  Avoid raw meat and uncooked cheese. These carry germs that can cause birth defects  in the baby.  Eating four or five small meals rather than three large meals a day may help relieve nausea and vomiting. If you start to feel nauseous, eating a few soda crackers can be helpful. Drinking liquids between meals instead of during meals also seems to help nausea and vomiting.  If you develop constipation, eat more high-fiber foods, such as fresh vegetables or fruit and whole grains. Drink enough fluids to keep your urine clear or pale yellow. Activity and Exercise  Exercise only as directed by your health care provider. Exercising will help you:  Control your weight.  Stay in shape.  Be prepared for labor and delivery.  Experiencing pain or cramping in the lower abdomen or low back is a good sign that you  should stop exercising. Check with your health care provider before continuing normal exercises.  Try to avoid standing for long periods of time. Move your legs often if you must stand in one place for a long time.  Avoid heavy lifting.  Wear low-heeled shoes, and practice good posture.  You may continue to have sex unless your health care provider directs you otherwise. Relief of Pain or Discomfort  Wear a good support bra for breast tenderness.   Take warm sitz baths to soothe any pain or discomfort caused by hemorrhoids. Use hemorrhoid cream if your health care provider approves.   Rest with your legs elevated if you have leg cramps or low back pain.  If you develop varicose veins in your legs, wear support hose. Elevate your feet for 15 minutes, 3-4 times a day. Limit salt in your diet. Prenatal Care  Schedule your prenatal visits by the twelfth week of pregnancy. They are usually scheduled monthly at first, then more often in the last 2 months before delivery.  Write down your questions. Take them to your prenatal visits.  Keep all your prenatal visits as directed by your health care provider. Safety  Wear your seat belt at all times when driving.  Make a list of emergency phone numbers, including numbers for family, friends, the hospital, and police and fire departments. General Tips  Ask your health care provider for a referral to a local prenatal education class. Begin classes no later than at the beginning of month 6 of your pregnancy.  Ask for help if you have counseling or nutritional needs during pregnancy. Your health care provider can offer advice or refer you to specialists for help with various needs.  Do not use hot tubs, steam rooms, or saunas.  Do not douche or use tampons or scented sanitary pads.  Do not cross your legs for long periods of time.  Avoid cat litter boxes and soil used by cats. These carry germs that can cause birth defects in the baby  and possibly loss of the fetus by miscarriage or stillbirth.  Avoid all smoking, herbs, alcohol, and medicines not prescribed by your health care provider. Chemicals in these affect the formation and growth of the baby.  Do not use any tobacco products, including cigarettes, chewing tobacco, and electronic cigarettes. If you need help quitting, ask your health care provider. You may receive counseling support and other resources to help you quit.  Schedule a dentist appointment. At home, brush your teeth with a soft toothbrush and be gentle when you floss. SEEK MEDICAL CARE IF:   You have dizziness.  You have mild pelvic cramps, pelvic pressure, or nagging pain in the abdominal area.  You have persistent nausea, vomiting,  or diarrhea.  You have a bad smelling vaginal discharge.  You have pain with urination.  You notice increased swelling in your face, hands, legs, or ankles. SEEK IMMEDIATE MEDICAL CARE IF:   You have a fever.  You are leaking fluid from your vagina.  You have spotting or bleeding from your vagina.  You have severe abdominal cramping or pain.  You have rapid weight gain or loss.  You vomit blood or material that looks like coffee grounds.  You are exposed to [place] measles and have never had them.  You are exposed to fifth disease or chickenpox.  You develop a severe headache.  You have shortness of breath.  You have any kind of trauma, such as from a fall or a car accident.   This information is not intended to replace advice given to you by your health care provider. Make sure you discuss any questions you have with your health care provider.   Document Released: 12/09/2001 Document Revised: 01/05/2015 Document Reviewed: 10/25/2013 Elsevier Interactive Patient Education 2016 [employer] Prenatal Vitamin and Mineral Combinations (oral solid dosage forms) What is this medicine? PRENATAL VITAMIN AND MINERAL combinations are used before, during, and  after pregnancy to help provide provide good nutrition. This medicine may be used for other purposes; ask your health care provider or pharmacist if you have questions. What should I tell my health care provider before I take this medicine? They need to know if you have any of these conditions: -bleeding or clotting disorder -history of anemia of any type -other chronic health condition -an unusual or allergic reaction to vitamins, minerals, other medicines, foods, dyes, or preservatives How should I use this medicine? Take this medicine by mouth with a glass of water. You can take it with or without food. If it upsets your stomach, take it with food. Chewable prenatal vitamin tablets may be chewed completely before swallowing. Follow the directions on the prescription label. The usual dose is taken once a day. Do not take your medicine more often than directed. Contact your pediatrician regarding the use of this medicine in children. Special care may be needed. This medicine is intended for females who are pregnant, breast-feeding, or may become pregnant. Overdosage: If you think you have taken too much of this medicine contact a poison control center or emergency room at once. NOTE: This medicine is only for you. Do not share this medicine with others. What if I miss a dose? If you miss a dose, take it as soon as you can. If it is almost time for your next dose, take only that dose. Do not take double or extra doses. What may interact with this medicine? -alendronate -antacids -cefdinir -cefditoren -etidronate -fluoroquinolone antibiotics (examples: ciprofloxacin, gatifloxacin, levofloxacin) -ibandronate -levodopa -risedronate -tetracycline antibiotics (examples: doxycycline, minocycline, tetracycline) -thyroid hormones -warfarin This list may not describe all possible interactions. Give your health care provider a list of all the medicines, herbs, non-prescription drugs, or dietary  supplements you use. Also tell them if you smoke, drink alcohol, or use illegal drugs. Some items may interact with your medicine. What should I watch for while using this medicine? See your health care professional for regular checks on your progress. Remember that vitamin and mineral supplements do not replace the need for good nutrition from a balanced diet. Stools commonly change color when vitamins and minerals are taken. Notify your health care professional if this change is alarming or accompanied by other symptoms, like abdominal pain. What side effects  may I notice from receiving this medicine? Side effects that you should report to your doctor or health care professional as soon as possible: -allergic reaction such as skin rash or difficulty breathing -vomiting Side effects that usually do not require medical attention (report to your doctor or health care professional if they continue or are bothersome): -nausea -stomach upset This list may not describe all possible side effects. Call your doctor for medical advice about side effects. You may report side effects to FDA at 1-800-FDA-1088. Where should I keep my medicine? Keep out of the reach of children. Most vitamins and minerals should be stored at controlled room temperature. Check your specific product directions. Protect from heat and moisture. Throw away any unused medicine after the expiration date. NOTE: This sheet is a summary. It may not cover all possible information. If you have questions about this medicine, talk to your doctor, pharmacist, or health care provider.    2016, Elsevier/Gold Standard. (2015-07-12 09:02:38)

## 2016-03-21 NOTE — MAU Provider Note (Signed)
History     CSN: [id number]  Arrival date and time: 03/21/16 [id number]   First Provider Initiated Contact with Patient 03/21/16 0901      Chief Complaint  Patient presents with  . Abdominal Pain  . Vaginal Bleeding   HPI  Tina [name] 24 y.o. [id number] @ 668w5d presents with c/o vaginal spotting 2 days ago and not having any spotting today. Reported intercourse 2 days ago. C/o lower abdominal pain described as cramping.    Past Medical History  Diagnosis Date  . Medical history non-contributory     Past Surgical History  Procedure Laterality Date  . No past surgeries    . Extraction of wisdom    . Cesarean section N/A 07/09/2015    Procedure: CESAREAN SECTION;  Surgeon: Osborn CohoAngela Roberts, MD;  Location: [facility];  Service: Obstetrics;  Laterality: N/A;    Family History  Problem Relation Age of Onset  . Arthritis Neg Hx   . Asthma Neg Hx   . Birth defects Neg Hx   . Depression Neg Hx   . Drug abuse Neg Hx   . Early death Neg Hx   . Hearing loss Neg Hx   . Heart disease Neg Hx   . Hyperlipidemia Neg Hx   . Hypertension Neg Hx   . Kidney disease Neg Hx   . Learning disabilities Neg Hx   . Mental illness Neg Hx   . Mental retardation Neg Hx   . Miscarriages / Stillbirths Neg Hx   . Stroke Neg Hx   . Vision loss Neg Hx   . Varicose Veins Neg Hx   . Diabetes Paternal Aunt   . Alcohol abuse Maternal Grandfather   . COPD Maternal Grandfather   . Diabetes Paternal Grandmother     Social History  Substance Use Topics  . Smoking status: Never Smoker   . Smokeless tobacco: Never Used  . Alcohol Use: No    Allergies: No Known Allergies  Prescriptions prior to admission  Medication Sig Dispense Refill Last Dose  . [DISCONTINUED] azithromycin (ZITHROMAX) 250 MG tablet Take 1 tablet (250 mg total) by mouth daily. Take first 2 tablets together, then 1 every day until finished. 6 tablet 0   . [DISCONTINUED] ibuprofen (ADVIL,MOTRIN) 600 MG tablet Take 1 tablet (600 mg total)  by mouth every 6 (six) hours as needed. 30 tablet 2   . [DISCONTINUED] oxyCODONE-acetaminophen (PERCOCET/ROXICET) 5-325 MG per tablet Take 1 tablet by mouth every 4 (four) hours as needed (for pain scale 4-7). 30 tablet 0     Review of Systems  Constitutional: Negative for fever.  Gastrointestinal: Positive for abdominal pain.  All other systems reviewed and are negative.  Physical Exam   Blood pressure 127/72, pulse 83, temperature 98.3 F (36.8 C), temperature source Oral, resp. rate 18, last menstrual period 01/20/2016, currently breastfeeding.  Physical Exam  Nursing note and vitals reviewed. Constitutional: She is oriented to person, place, and time. She appears well-developed and well-nourished.  HENT:  Head: Normocephalic.  Cardiovascular: Normal rate.   Respiratory: Effort normal and breath sounds normal. No respiratory distress.  GI: Soft. She exhibits no distension.  Neurological: She is alert and oriented to person, place, and time.  Skin: Skin is warm and dry.  Psychiatric: She has a normal mood and affect. Her behavior is normal. Judgment and thought content normal.   Results for orders placed or performed during the hospital encounter of 03/21/16 (from the past 24 hour(s))  Urinalysis, Routine w reflex  microscopic (not at [facility])     Status: Abnormal   Collection Time: 03/21/16  8:40 AM  Result Value Ref Range   Color, Urine YELLOW YELLOW   APPearance HAZY (A) CLEAR   Specific Gravity, Urine 1.015 1.005 - 1.030   pH 7.0 5.0 - 8.0   Glucose, UA NEGATIVE NEGATIVE mg/dL   Hgb urine dipstick NEGATIVE NEGATIVE   Bilirubin Urine NEGATIVE NEGATIVE   Ketones, ur NEGATIVE NEGATIVE mg/dL   Protein, ur NEGATIVE NEGATIVE mg/dL   Nitrite NEGATIVE NEGATIVE   Leukocytes, UA NEGATIVE NEGATIVE  Pregnancy, urine POC     Status: Abnormal   Collection Time: 03/21/16  8:48 AM  Result Value Ref Range   Preg Test, Ur POSITIVE (A) NEGATIVE  CBC     Status: None   Collection Time:  03/21/16  9:17 AM  Result Value Ref Range   WBC 9.0 4.0 - 10.5 K/uL   RBC 4.15 3.87 - 5.11 MIL/uL   Hemoglobin 13.0 12.0 - 15.0 g/dL   HCT 16.1 09.6 - 04.5 %   MCV 90.8 78.0 - 100.0 fL   MCH 31.3 26.0 - 34.0 pg   MCHC 34.5 30.0 - 36.0 g/dL   RDW 40.9 81.1 - 91.4 %   Platelets 238 150 - 400 K/uL  hCG, quantitative, pregnancy     Status: Abnormal   Collection Time: 03/21/16  9:18 AM  Result Value Ref Range   hCG, Beta Chain, Quant, S 78295 (H) <5 mIU/mL  US Ob Comp Less 14 Wks  03/21/2016  CLINICAL DATA:  First trimester pregnancy, bleeding EXAM: OBSTETRIC <14 WK [place] AND TRANSVAGINAL OB US TECHNIQUE: Both transabdominal and transvaginal ultrasound examinations were performed for complete evaluation of the gestation as well as the maternal uterus, adnexal regions, and pelvic cul-de-sac. Transvaginal technique was performed to assess early pregnancy. COMPARISON:  None. FINDINGS: Intrauterine gestational sac: Visualized/normal in shape. Yolk sac:  Present Embryo:  Present Cardiac Activity: Present Heart Rate: 177  bpm CRL:  14.5  mm   7 w   5 d                  [place] EDC: 11/02/2016 Subchorionic hemorrhage:  None visualized. Maternal uterus/adnexae: Right ovary not visualized. Left ovary appears normal with a 2 cm corpus luteum. No free fluid in the pelvis. IMPRESSION: Live intrauterine gestation Electronically Signed   By: Esperanza Heir M.D.   On: 03/21/2016 11:12   US Ob Transvaginal  03/21/2016  CLINICAL DATA:  First trimester pregnancy, bleeding EXAM: OBSTETRIC <14 WK [place] AND TRANSVAGINAL OB US TECHNIQUE: Both transabdominal and transvaginal ultrasound examinations were performed for complete evaluation of the gestation as well as the maternal uterus, adnexal regions, and pelvic cul-de-sac. Transvaginal technique was performed to assess early pregnancy. COMPARISON:  None. FINDINGS: Intrauterine gestational sac: Visualized/normal in shape. Yolk sac:  Present Embryo:  Present Cardiac Activity: Present  Heart Rate: 177  bpm CRL:  14.5  mm   7 w   5 d                  [place] EDC: 11/02/2016 Subchorionic hemorrhage:  None visualized. Maternal uterus/adnexae: Right ovary not visualized. Left ovary appears normal with a 2 cm corpus luteum. No free fluid in the pelvis. IMPRESSION: Live intrauterine gestation Electronically Signed   By: Esperanza Heir M.D.   On: 03/21/2016 11:12   MAU Course  Procedures  MDM Ectopic work up- Negative; FHR present- Reassurred and advised to start prenatal  care at [facility] of choice  Assessment and Plan   1. Pregnancy of unknown anatomic location   2. Bleeding   3. Pain   4. Abdominal pain during pregnancy, antepartum    Start Routine Prenatal care at [facility] of choice Discharge  Devyn Sheerin Grissett 03/21/2016, 9:03 AM

## 2016-03-21 NOTE — MAU Note (Signed)
Had spotting yesterday; c/o sharp, abdominal cramping intermittent every day; also having upper abdominal pain & burning since she has been pregnant;

## 2016-04-18 LAB — OB RESULTS CONSOLE GC/CHLAMYDIA
CHLAMYDIA, DNA PROBE: NEGATIVE
Gonorrhea: NEGATIVE

## 2016-04-18 LAB — OB RESULTS CONSOLE ABO/RH: RH TYPE: POSITIVE

## 2016-04-18 LAB — OB RESULTS CONSOLE RUBELLA ANTIBODY, IGM: Rubella: IMMUNE

## 2016-04-18 LAB — OB RESULTS CONSOLE RPR: RPR: NONREACTIVE

## 2016-04-18 LAB — OB RESULTS CONSOLE ANTIBODY SCREEN: ANTIBODY SCREEN: NEGATIVE

## 2016-04-18 LAB — OB RESULTS CONSOLE HEPATITIS B SURFACE ANTIGEN: HEP B S AG: NEGATIVE

## 2016-04-18 LAB — OB RESULTS CONSOLE HIV ANTIBODY (ROUTINE TESTING): HIV: NONREACTIVE

## 2016-09-16 ENCOUNTER — Encounter (HOSPITAL_COMMUNITY): Payer: Self-pay | Admitting: *Deleted

## 2016-09-16 ENCOUNTER — Observation Stay (HOSPITAL_COMMUNITY)
Admission: AD | Admit: 2016-09-16 | Discharge: 2016-09-16 | Disposition: A | Discharge status: Home / Self Care | Payer: Medicaid Other | Source: Ambulatory Visit | Attending: Obstetrics and Gynecology | Admitting: Obstetrics and Gynecology

## 2016-09-16 ENCOUNTER — Inpatient Hospital Stay (HOSPITAL_COMMUNITY): Payer: Medicaid Other

## 2016-09-16 ENCOUNTER — Observation Stay (HOSPITAL_COMMUNITY): Payer: Medicaid Other

## 2016-09-16 DIAGNOSIS — Z6841 Body Mass Index (BMI) 40.0 and over, adult: Secondary | ICD-10-CM

## 2016-09-16 DIAGNOSIS — Z3A33 33 weeks gestation of pregnancy: Secondary | ICD-10-CM | POA: Diagnosis not present

## 2016-09-16 DIAGNOSIS — B951 Streptococcus, group B, as the cause of diseases classified elsewhere: Secondary | ICD-10-CM | POA: Diagnosis present

## 2016-09-16 DIAGNOSIS — R768 Other specified abnormal immunological findings in serum: Secondary | ICD-10-CM | POA: Diagnosis not present

## 2016-09-16 DIAGNOSIS — O368130 Decreased fetal movements, third trimester, not applicable or unspecified: Principal | ICD-10-CM | POA: Insufficient documentation

## 2016-09-16 DIAGNOSIS — O368190 Decreased fetal movements, unspecified trimester, not applicable or unspecified: Secondary | ICD-10-CM

## 2016-09-16 DIAGNOSIS — B977 Papillomavirus as the cause of diseases classified elsewhere: Secondary | ICD-10-CM | POA: Insufficient documentation

## 2016-09-16 DIAGNOSIS — Z98891 History of uterine scar from previous surgery: Secondary | ICD-10-CM

## 2016-09-16 DIAGNOSIS — R888 Abnormal findings in other body fluids and substances: Secondary | ICD-10-CM | POA: Insufficient documentation

## 2016-09-16 DIAGNOSIS — IMO0002 Reserved for concepts with insufficient information to code with codable children: Secondary | ICD-10-CM | POA: Diagnosis present

## 2016-09-16 DIAGNOSIS — R7689 Other specified abnormal immunological findings in serum: Secondary | ICD-10-CM | POA: Diagnosis not present

## 2016-09-16 DIAGNOSIS — O09299 Supervision of pregnancy with other poor reproductive or obstetric history, unspecified trimester: Secondary | ICD-10-CM

## 2016-09-16 LAB — URINALYSIS, ROUTINE W REFLEX MICROSCOPIC
BILIRUBIN URINE: NEGATIVE
GLUCOSE, UA: NEGATIVE mg/dL
HGB URINE DIPSTICK: NEGATIVE
Ketones, ur: 15 mg/dL — AB
Leukocytes, UA: NEGATIVE
NITRITE: NEGATIVE
PH: 6.5 (ref 5.0–8.0)
Protein, ur: NEGATIVE mg/dL
SPECIFIC GRAVITY, URINE: 1.02 (ref 1.005–1.030)

## 2016-09-16 MED ORDER — ACETAMINOPHEN 325 MG PO TABS: 650.0000 mg | ORAL_TABLET | Freq: Four times a day (QID) | ORAL | Status: DC | PRN

## 2016-09-16 MED ORDER — NIFEDIPINE 10 MG PO CAPS
10.0000 mg | ORAL_CAPSULE | Freq: Once | ORAL | Status: AC
  Administered 2016-09-16: 10 mg via ORAL
  Filled 2016-09-16: qty 1

## 2016-09-16 NOTE — MAU Provider Note (Signed)
History     CSN: [id number]  Arrival date and time: 09/16/16 1146   First Provider Initiated Contact with Patient 09/16/16 1323      Chief Complaint  Patient presents with  . Decreased Fetal Movement  . Back Pain   HPI  Tina [name] is a 25 y.o. [phone] at [redacted]w[redacted]d who presents with decreased fetal movement and low back pain. Reports no fetal movement since 7 pm last night. Felt baby move once when nurse applied fetal monitors in MAU.  Also reports low back pain x 3 days. Feels low back pain once every 3 hours; pain lasts for a few minutes at a time; does not radiate. Denies abdominal pain, vaginal bleeding, or LOF.  OB History    Gravida Para Term Preterm AB Living   4 2 1 1 1 1    SAB TAB Ectopic Multiple Live Births   1     0 1      Past Medical History:  Diagnosis Date  . Preterm labor     Past Surgical History:  Procedure Laterality Date  . CESAREAN SECTION N/A 07/09/2015   Procedure: CESAREAN SECTION;  Surgeon: Osborn Coho, MD;  Location: [facility];  Service: Obstetrics;  Laterality: N/A;  . extraction of wisdom      Family History  Problem Relation Age of Onset  . Diabetes Paternal Aunt   . Alcohol abuse Maternal Grandfather   . COPD Maternal Grandfather   . Diabetes Paternal Grandmother   . Arthritis Neg Hx   . Asthma Neg Hx   . Birth defects Neg Hx   . Depression Neg Hx   . Drug abuse Neg Hx   . Early death Neg Hx   . Hearing loss Neg Hx   . Heart disease Neg Hx   . Hyperlipidemia Neg Hx   . Hypertension Neg Hx   . Kidney disease Neg Hx   . Learning disabilities Neg Hx   . Mental illness Neg Hx   . Mental retardation Neg Hx   . Miscarriages / Stillbirths Neg Hx   . Stroke Neg Hx   . Vision loss Neg Hx   . Varicose Veins Neg Hx     Social History  Substance Use Topics  . Smoking status: Never Smoker  . Smokeless tobacco: Never Used  . Alcohol use No    Allergies: No Known Allergies  Prescriptions Prior to Admission  Medication Sig  Dispense Refill Last Dose  . Prenatal Multivit-Min-Fe-FA (PRENATAL VITAMINS) 0.8 MG tablet Take 1 tablet by mouth daily. 30 tablet 12     Review of Systems  Gastrointestinal: Negative.   Genitourinary: Negative.   Musculoskeletal: Positive for back pain.   Physical Exam   Blood pressure 125/74, pulse 87, temperature 98.1 F (36.7 C), temperature source Oral, resp. rate 18, last menstrual period 01/20/2016, currently breastfeeding.  Physical Exam  Nursing note and vitals reviewed. Constitutional: She is oriented to person, place, and time. She appears well-developed and well-nourished. No distress.  HENT:  Head: Normocephalic and atraumatic.  Eyes: Conjunctivae are normal. Right eye exhibits no discharge. Left eye exhibits no discharge. No scleral icterus.  Neck: Normal range of motion.  Respiratory: Effort normal. No respiratory distress.  GI: Soft. There is no tenderness.  Neurological: She is alert and oriented to person, place, and time.  Skin: Skin is warm and dry. She is not diaphoretic.  Psychiatric: She has a normal mood and affect. Her behavior is normal. Judgment and thought content normal.  Fetal Tracing:  Baseline: 135 Variability: moderate Accelerations: 15x15 Decelerations: none  Toco: x1   MAU Course  Procedures No results found for this or any previous visit (from the past 24 hour(s)).   MDM Reactive fetal tracing  Pt continues to report no fetal movement -- BPP ordered BPP 4/8, off for movement & breathing. AFI 16 Discussed results with Dr. Normand Sloopillard. Will obs pt on antenatal & repeat BPP at 9 pm  Assessment and Plan  A:  1. Decreased fetal movement   2. [redacted] weeks gestation of pregnancy   3. Decreased fetal movement during pregnancy in third trimester, antepartum, not applicable or unspecified fetus     P: Antenatal observation Continuous monitoring Regular diet BPP at 9 pm  Judeth HornErin Karron Alvizo 09/16/2016, 1:21 PM

## 2016-09-16 NOTE — Progress Notes (Signed)
New orders received by Dr. Normand Sloopillard.

## 2016-09-16 NOTE — MAU Note (Signed)
FM audible with doppler.

## 2016-09-16 NOTE — Progress Notes (Signed)
Pt instructed to drink fluids, hasn't had anything to drink or eat since this morning.

## 2016-09-16 NOTE — MAU Note (Signed)
Pt states she has had no FM since yesterday afternoon, also lower back pain for the last 2 days.  Denies bleeding or LOF.

## 2016-09-16 NOTE — Discharge Summary (Signed)
Physician Discharge Summary  Patient ID: Tina [name] MRN: [id number] DOB/AGE: 1991-10-02 25 y.o.  Admit date: 09/16/2016 Discharge date: 09/16/2016  Admission Diagnoses:  Discharge Diagnoses:  Principal Problem:   Decreased fetal movement during pregnancy in third trimester, antepartum Active Problems:   Preterm delivery--2015, 22 weeks, iufd   Positive GBS test   Marginal insertion of umbilical cord   Previous cesarean section   Hx of incompetent cervix, currently pregnant   BMI 40.0-44.9, adult (HCC)   HPV test positive   HSV-2 seropositive   Discharged Condition: Stable  Hospital Course:  Seen in MAU for decreased FM and low back pain.  Cervix closed, occasional UCs, received single dose of Procardia with benefit.  NST reactive, but BPP 4/8 (no FBM or movement), AFI 16.94, 62%ile, vtx.  Patient had not eaten, so was fed, and BPP was repeated at 9pm (6 hours after initial BPP).  [facility], and is to f/u as scheduled in the office.  Will have f/u ROB visit and BPP next week.  Consults: None  Significant Diagnostic Studies: radiology: Ultrasound: See above  Treatments: None  Discharge Exam: Blood pressure 117/71, pulse 97, temperature 98.4 F (36.9 C), temperature source Oral, resp. rate 16, height 5\' 2"  (1.575 m), weight 107 kg (236 lb), last menstrual period 01/20/2016, SpO2 100 %, currently breastfeeding. General appearance: alert Resp: clear to auscultation bilaterally Cardio: normal apical impulse Pelvic: cervix normal in appearance, external genitalia normal, no adnexal masses or tenderness, no cervical motion tenderness, rectovaginal septum normal, uterus normal size, shape, and consistency and vagina normal without discharge Extremities: extremities normal, atraumatic, no cyanosis or edema  Disposition: 01-Home or Self Care     Medication List    TAKE these medications   Prenatal Vitamins 0.8 MG tablet Take  1 tablet by mouth daily.      Follow-up Information    [facility] Follow up in 1 week(s).   Specialty:  Obstetrics and Gynecology Why:  Call for any questions or concerns.  Baby should have at least 10 movements per day. Contact information: [address] [place] [phone]          Signed: Nigel BridgemanLATHAM, Totiana Everson 09/16/2016, 10:25 PM

## 2016-09-16 NOTE — Discharge Instructions (Signed)
Fetal Movement Counts  Performing a fetal movement count is highly recommended in high-risk pregnancies, but it is good for every pregnant woman to do. Your health care provider may ask you to start counting fetal movements at 28 weeks of the pregnancy. Fetal movements often increase:  After eating a full meal.  After physical activity.  After eating or drinking something sweet or cold.  At rest. Pay attention to when you feel the baby is most active. This will help you notice a pattern of your baby's sleep and wake cycles and what factors contribute to an increase in fetal movement. It is important to perform a fetal movement count at the same time each day when your baby is normally most active.  HOW TO COUNT FETAL MOVEMENTS 1. Find a quiet and comfortable area to sit or lie down on your left side. Lying on your left side provides the best blood and oxygen circulation to your baby. 2. Write down the day and time on a sheet of paper or in a journal. 3. Start counting kicks, flutters, swishes, rolls, or jabs in a 2-hour period. You should feel at least 10 movements within 2 hours. 4. If you do not feel 10 movements in 2 hours, wait 2-3 hours and count again. Look for a change in the pattern or not enough counts in 2 hours. SEEK MEDICAL CARE IF:  You feel less than 10 counts in 2 hours, tried twice.  There is no movement in over an hour.  The pattern is changing or taking longer each day to reach 10 counts in 2 hours.  You feel the baby is not moving as he or she usually does.    Preterm Labor Information Preterm labor is when labor starts before you are [redacted] weeks pregnant. The normal length of pregnancy is 39 to 41 weeks.  CAUSES  The cause of preterm labor is not often known. The most common known cause is infection. RISK FACTORS  Having a history of preterm labor.  Having your water break before it should.  Having a placenta that covers the opening of the cervix.  Having a  placenta that breaks away from the uterus.  Having a cervix that is too weak to hold the baby in the uterus.  Having too much fluid in the amniotic sac.  Taking drugs or smoking while pregnant.  Not gaining enough weight while pregnant.  Being younger than 3 and older than 26 years old.  Having a low income.  Being African American. SYMPTOMS  Period-like cramps, belly (abdominal) pain, or back pain.  Contractions that are regular, as often as six in an hour. They may be mild or painful.  Contractions that start at the top of the belly. They then move to the lower belly and back.  Lower belly pressure that seems to get stronger.  Bleeding from the vagina.  Fluid leaking from the vagina. TREATMENT  Treatment depends on:  Your condition.  The condition of your baby.  How many weeks pregnant you are. Your doctor may have you:  Take medicine to stop contractions.  Stay in bed except to use the restroom (bed rest).  Stay in the hospital. WHAT SHOULD YOU DO IF YOU THINK YOU ARE IN PRETERM LABOR? Call your doctor right away. You need to go to the hospital right away.  HOW CAN YOU PREVENT PRETERM LABOR IN FUTURE PREGNANCIES?  Stop smoking, if you smoke.  Maintain healthy weight gain.  Do not take drugs  or be around chemicals that are not needed.  Tell your doctor if you think you have an infection.  Tell your doctor if you had a preterm labor before.   This information is not intended to replace advice given to you by your health care provider. Make sure you discuss any questions you have with your health care provider.   Document Released: 03/13/2009 Document Revised: 05/01/2015 Document Reviewed: 01/17/2013 Elsevier Interactive Patient Education [employer]2016 [employer] Inc.

## 2016-09-16 NOTE — Progress Notes (Signed)
Discharge instructions explained to the patient.  Patient verbalizes understanding. Patient discharged with significant other. Milon DikesKristina D Krisha Beegle, RN

## 2016-10-20 ENCOUNTER — Encounter (HOSPITAL_COMMUNITY): Payer: Self-pay

## 2016-10-20 ENCOUNTER — Inpatient Hospital Stay (HOSPITAL_COMMUNITY): Payer: Medicaid Other | Admitting: Anesthesiology

## 2016-10-20 ENCOUNTER — Inpatient Hospital Stay (HOSPITAL_COMMUNITY)
Admission: AD | Admit: 2016-10-20 | Discharge: 2016-10-23 | DRG: 775 | Disposition: A | Discharge status: Home / Self Care | Payer: Medicaid Other | Source: Ambulatory Visit | Attending: Obstetrics and Gynecology | Admitting: Obstetrics and Gynecology

## 2016-10-20 DIAGNOSIS — Z6841 Body Mass Index (BMI) 40.0 and over, adult: Secondary | ICD-10-CM | POA: Diagnosis not present

## 2016-10-20 DIAGNOSIS — O34219 Maternal care for unspecified type scar from previous cesarean delivery: Secondary | ICD-10-CM | POA: Diagnosis not present

## 2016-10-20 DIAGNOSIS — H539 Unspecified visual disturbance: Secondary | ICD-10-CM | POA: Diagnosis present

## 2016-10-20 DIAGNOSIS — O34211 Maternal care for low transverse scar from previous cesarean delivery: Secondary | ICD-10-CM | POA: Diagnosis present

## 2016-10-20 DIAGNOSIS — O2243 Hemorrhoids in pregnancy, third trimester: Secondary | ICD-10-CM | POA: Diagnosis present

## 2016-10-20 DIAGNOSIS — O99824 Streptococcus B carrier state complicating childbirth: Secondary | ICD-10-CM | POA: Diagnosis present

## 2016-10-20 DIAGNOSIS — B951 Streptococcus, group B, as the cause of diseases classified elsewhere: Secondary | ICD-10-CM | POA: Diagnosis present

## 2016-10-20 DIAGNOSIS — R768 Other specified abnormal immunological findings in serum: Secondary | ICD-10-CM | POA: Diagnosis present

## 2016-10-20 DIAGNOSIS — Z98891 History of uterine scar from previous surgery: Secondary | ICD-10-CM

## 2016-10-20 DIAGNOSIS — Z833 Family history of diabetes mellitus: Secondary | ICD-10-CM

## 2016-10-20 DIAGNOSIS — Z3A38 38 weeks gestation of pregnancy: Secondary | ICD-10-CM

## 2016-10-20 DIAGNOSIS — O43123 Velamentous insertion of umbilical cord, third trimester: Secondary | ICD-10-CM | POA: Diagnosis present

## 2016-10-20 DIAGNOSIS — O99214 Obesity complicating childbirth: Secondary | ICD-10-CM | POA: Diagnosis present

## 2016-10-20 DIAGNOSIS — O3433 Maternal care for cervical incompetence, third trimester: Secondary | ICD-10-CM | POA: Diagnosis present

## 2016-10-20 DIAGNOSIS — IMO0002 Reserved for concepts with insufficient information to code with codable children: Secondary | ICD-10-CM | POA: Diagnosis present

## 2016-10-20 DIAGNOSIS — Z3483 Encounter for supervision of other normal pregnancy, third trimester: Secondary | ICD-10-CM | POA: Diagnosis present

## 2016-10-20 LAB — COMPREHENSIVE METABOLIC PANEL
ALBUMIN: 2.9 g/dL — AB (ref 3.5–5.0)
ALT: 21 U/L (ref 14–54)
ANION GAP: 8 (ref 5–15)
AST: 24 U/L (ref 15–41)
Alkaline Phosphatase: 129 U/L — ABNORMAL HIGH (ref 38–126)
BILIRUBIN TOTAL: 0.6 mg/dL (ref 0.3–1.2)
BUN: 8 mg/dL (ref 6–20)
CO2: 21 mmol/L — ABNORMAL LOW (ref 22–32)
Calcium: 9.6 mg/dL (ref 8.9–10.3)
Chloride: 107 mmol/L (ref 101–111)
Creatinine, Ser: 0.5 mg/dL (ref 0.44–1.00)
GFR calc Af Amer: >60 mL/min (ref >60)
Glucose, Bld: 74 mg/dL (ref 65–99)
POTASSIUM: 3.9 mmol/L (ref 3.5–5.1)
Sodium: 136 mmol/L (ref 135–145)
Total Protein: 7 g/dL (ref 6.5–8.1)

## 2016-10-20 LAB — TYPE AND SCREEN
ABO/RH(D): O POS
ANTIBODY SCREEN: NEGATIVE

## 2016-10-20 LAB — LACTATE DEHYDROGENASE: LDH: 153 U/L (ref 98–192)

## 2016-10-20 LAB — CBC
HEMATOCRIT: 37.6 % (ref 36.0–46.0)
HEMOGLOBIN: 13 g/dL (ref 12.0–15.0)
MCH: 30.9 pg (ref 26.0–34.0)
MCHC: 34.6 g/dL (ref 30.0–36.0)
MCV: 89.3 fL (ref 78.0–100.0)
Platelets: 204 10*3/uL (ref 150–400)
RBC: 4.21 MIL/uL (ref 3.87–5.11)
RDW: 14.1 % (ref 11.5–15.5)
WBC: 11.2 10*3/uL — AB (ref 4.0–10.5)

## 2016-10-20 LAB — RAPID HIV SCREEN (HIV 1/2 AB+AG)
HIV 1/2 ANTIBODIES: NONREACTIVE
HIV-1 P24 Antigen - HIV24: NONREACTIVE

## 2016-10-20 LAB — PROTEIN / CREATININE RATIO, URINE
Creatinine, Urine: 147 mg/dL
Protein Creatinine Ratio: 0.29 mg/mg{Cre} — ABNORMAL HIGH (ref 0.00–0.15)
Total Protein, Urine: 43 mg/dL

## 2016-10-20 LAB — URIC ACID: URIC ACID, SERUM: 5.7 mg/dL (ref 2.3–6.6)

## 2016-10-20 LAB — OB RESULTS CONSOLE GBS: GBS: POSITIVE

## 2016-10-20 MED ORDER — ACETAMINOPHEN 325 MG PO TABS: 650.0000 mg | ORAL_TABLET | ORAL | Status: DC | PRN

## 2016-10-20 MED ORDER — ONDANSETRON HCL 4 MG/2ML IJ SOLN: 4.0000 mg | Freq: Four times a day (QID) | INTRAMUSCULAR | Status: DC | PRN

## 2016-10-20 MED ORDER — EPHEDRINE 5 MG/ML INJ: 10.0000 mg | INTRAVENOUS | Status: DC | PRN

## 2016-10-20 MED ORDER — FENTANYL CITRATE (PF) 100 MCG/2ML IJ SOLN
50.0000 ug | INTRAMUSCULAR | Status: DC | PRN
  Administered 2016-10-20: 100 ug via INTRAVENOUS
  Filled 2016-10-20: qty 2

## 2016-10-20 MED ORDER — LIDOCAINE HCL (PF) 1 % IJ SOLN
30.0000 mL | INTRAMUSCULAR | Status: AC | PRN
  Administered 2016-10-21: 30 mL via SUBCUTANEOUS
  Filled 2016-10-20: qty 30

## 2016-10-20 MED ORDER — FENTANYL 2.5 MCG/ML BUPIVACAINE 1/10 % EPIDURAL INFUSION (WH - ANES)
14.0000 mL/h | INTRAMUSCULAR | Status: DC | PRN
  Administered 2016-10-20 – 2016-10-21 (×2): 14 mL/h via EPIDURAL
  Filled 2016-10-20 (×2): qty 125

## 2016-10-20 MED ORDER — PENICILLIN G POTASSIUM 5000000 UNITS IJ SOLR
2.5000 10*6.[IU] | INTRAVENOUS | Status: DC
  Administered 2016-10-21: 2.5 10*6.[IU] via INTRAVENOUS
  Filled 2016-10-20 (×4): qty 2.5

## 2016-10-20 MED ORDER — OXYTOCIN BOLUS FROM INFUSION
500.0000 mL | Freq: Once | INTRAVENOUS | Status: AC
  Administered 2016-10-21: 500 mL via INTRAVENOUS

## 2016-10-20 MED ORDER — LIDOCAINE HCL (PF) 1 % IJ SOLN
INTRAMUSCULAR | Status: DC | PRN
  Administered 2016-10-20 (×2): 4 mL

## 2016-10-20 MED ORDER — OXYTOCIN 40 UNITS IN LACTATED RINGERS INFUSION - SIMPLE MED
2.5000 [IU]/h | INTRAVENOUS | Status: DC
  Administered 2016-10-21: 2.5 [IU]/h via INTRAVENOUS
  Filled 2016-10-20: qty 1000

## 2016-10-20 MED ORDER — LACTATED RINGERS IV SOLN: 500.0000 mL | Freq: Once | INTRAVENOUS | Status: DC

## 2016-10-20 MED ORDER — DIPHENHYDRAMINE HCL 50 MG/ML IJ SOLN: 12.5000 mg | INTRAMUSCULAR | Status: DC | PRN

## 2016-10-20 MED ORDER — SOD CITRATE-CITRIC ACID 500-334 MG/5ML PO SOLN: 30.0000 mL | ORAL | Status: DC | PRN

## 2016-10-20 MED ORDER — LACTATED RINGERS IV SOLN: 500.0000 mL | INTRAVENOUS | Status: DC | PRN

## 2016-10-20 MED ORDER — LACTATED RINGERS IV SOLN
INTRAVENOUS | Status: DC
  Administered 2016-10-20: 20:00:00 via INTRAVENOUS

## 2016-10-20 MED ORDER — PHENYLEPHRINE 40 MCG/ML (10ML) SYRINGE FOR IV PUSH (FOR BLOOD PRESSURE SUPPORT)
80.0000 ug | PREFILLED_SYRINGE | INTRAVENOUS | Status: DC | PRN
  Administered 2016-10-20: 80 ug via INTRAVENOUS

## 2016-10-20 MED ORDER — PENICILLIN G POTASSIUM 5000000 UNITS IJ SOLR
5.0000 10*6.[IU] | Freq: Once | INTRAVENOUS | Status: AC
  Administered 2016-10-20: 5 10*6.[IU] via INTRAVENOUS
  Filled 2016-10-20: qty 5

## 2016-10-20 MED ORDER — PHENYLEPHRINE 40 MCG/ML (10ML) SYRINGE FOR IV PUSH (FOR BLOOD PRESSURE SUPPORT)
80.0000 ug | PREFILLED_SYRINGE | INTRAVENOUS | Status: DC | PRN
  Administered 2016-10-20: 80 ug via INTRAVENOUS
  Filled 2016-10-20: qty 10

## 2016-10-20 NOTE — Progress Notes (Signed)
Notified of pt arrival in MAU and exam with pt discomfort. Will admit to labor and delivery

## 2016-10-20 NOTE — Anesthesia Procedure Notes (Signed)
Epidural Patient location during procedure: OB  Staffing Anesthesiologist: Cathie Bonnell Performed: anesthesiologist   Preanesthetic Checklist Completed: patient identified, site marked, surgical consent, pre-op evaluation, timeout performed, IV checked, risks and benefits discussed and monitors and equipment checked  Epidural Patient position: sitting Prep: site prepped and draped and DuraPrep Patient monitoring: continuous pulse ox and blood pressure Approach: midline Location: L3-L4 Injection technique: LOR saline  Needle:  Needle type: Tuohy  Needle gauge: 17 G Needle length: 9 cm and 9 Needle insertion depth: 7 cm Catheter type: closed end flexible Catheter size: 19 Gauge Catheter at skin depth: 11 cm Test dose: negative  Assessment Events: blood not aspirated, injection not painful, no injection resistance, negative IV test and no paresthesia  Additional Notes Patient identified. Risks/Benefits/Options discussed with patient including but not limited to bleeding, infection, nerve damage, paralysis, failed block, incomplete pain control, headache, blood pressure changes, nausea, vomiting, reactions to medication both or allergic, itching and postpartum back pain. Confirmed with bedside nurse the patient's most recent platelet count. Confirmed with patient that they are not currently taking any anticoagulation, have any bleeding history or any family history of bleeding disorders. Patient expressed understanding and wished to proceed. All questions were answered. Sterile technique was used throughout the entire procedure. Please see nursing notes for vital signs. Test dose was given through epidural catheter and negative prior to continuing to dose epidural or start infusion. Warning signs of high block given to the patient including shortness of breath, tingling/numbness in hands, complete motor block, or any concerning symptoms with instructions to call for help. Patient was given  instructions on fall risk and not to get out of bed. All questions and concerns addressed with instructions to call with any issues or inadequate analgesia.        

## 2016-10-20 NOTE — MAU Note (Signed)
Pt reports contractions all day, worsening in the last 2 hours.

## 2016-10-20 NOTE — Anesthesia Preprocedure Evaluation (Signed)
Anesthesia Evaluation  Patient identified by MRN, date of birth, ID band Patient awake    Reviewed: Allergy & Precautions, NPO status , Patient's Chart, lab work & pertinent test results  History of Anesthesia Complications Negative for: history of anesthetic complications  Airway Mallampati: II  TM Distance: >3 FB Neck ROM: Full    Dental no notable dental hx. (+) Dental Advisory Given   Pulmonary neg pulmonary ROS,    Pulmonary exam normal breath sounds clear to auscultation       Cardiovascular negative cardio ROS Normal cardiovascular exam Rhythm:Regular Rate:Normal     Neuro/Psych negative neurological ROS  negative psych ROS   GI/Hepatic negative GI ROS, Neg liver ROS,   Endo/Other  Morbid obesity  Renal/GU negative Renal ROS  negative genitourinary   Musculoskeletal negative musculoskeletal ROS (+)   Abdominal   Peds negative pediatric ROS (+)  Hematology negative hematology ROS (+)   Anesthesia Other Findings   Reproductive/Obstetrics (+) Pregnancy                             Anesthesia Physical Anesthesia Plan  ASA: III  Anesthesia Plan: Epidural   Post-op Pain Management:    Induction:   Airway Management Planned:   Additional Equipment:   Intra-op Plan:   Post-operative Plan:   Informed Consent: I have reviewed the patients History and Physical, chart, labs and discussed the procedure including the risks, benefits and alternatives for the proposed anesthesia with the patient or authorized representative who has indicated his/her understanding and acceptance.   Dental advisory given  Plan Discussed with: CRNA  Anesthesia Plan Comments:         Anesthesia Quick Evaluation  

## 2016-10-20 NOTE — H&P (Signed)
Tina [name] is a 25 y.o. female, [id number] at 38.1 weeks, presenting for contractions.  Patient reports onset of contractions at 2pm that have progressively increased in frequency and intensity.  Patient with h/o cesarean section and opts for TOLAC. VBAC Calculation of 38%. Patient pregnancy and medical history significant for problems as listed below.  Patient does desire an epidural.  Patient HSV exam negative on 10/14/16 in office, but patient reports that she has not been taking Valtrex only a PNV.  Patient reports she was diagnosed last year via blood serum and has never had an outbreak.  Patient also reports seeing "black floaters," but denies HA, SOB, other visual disturbances, and edema. Patient states she has never had an eye exam.   Patient Active Problem List   Diagnosis Date Noted  . Decreased fetal movement during pregnancy in third trimester, antepartum 09/16/2016  . Marginal insertion of umbilical cord 09/16/2016  . Previous cesarean section 09/16/2016  . Hx of incompetent cervix, currently pregnant 09/16/2016  . BMI 40.0-44.9, adult (HCC) 09/16/2016  . HPV test positive 09/16/2016  . 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 Preterm delivery--2015, 22 weeks, iufd 07/08/2015  . Positive GBS test 07/08/2015  . Rubella non-immune status, antepartum 07/08/2015    History of present pregnancy: Patient entered care at 11.5 weeks.   EDC of 11/02/2016 was established by Definite LMP of 01/27/2016.   Anatomy scan:  21.5 weeks, with normal, but limited findings and an anterior placenta.   Additional [place] evaluations:  Anatomy:  EFW 23% BPD 14% HC 18%. Hands/digits not seen, spine not seen  25.2wks: NORMAL GROWTH AT 53%ILE, NORMAL FLUID, CERVIX 5.27 32.3wks: BPP 8/8 33.2wks: BPP 6/10 & 8/8 34.4wks: Vertex presentation. Anterior placenta. AFI is normal - 40 th % BPP 8/8 in 10 mins. cervix not measured per protocol. [place] are unremarkable fetal head is lagging=2.3%  35.1wks:  Singleton pregnancy.  Vertex presentation. Anterior placenta. AFI is normal-60th % BPP= 8/8 in 4 minutes 36.2wks:  SIUP, VERTEX, AFI = 30TH% EFW=34% HC LAGGING 9TH% BPP 8/8 . BPP weekly and EFW in 3wks for lagging HC. 37.2wks:  SIUP. VERTEX. AFI WNL - 55TH% BPP = 8/8   Significant prenatal events: 1st Trimester: Patient declined cerclage and 17P. GBS noted in urine. 2nd Trimester: Early Glucola Normal, CL 4.24cm.  Pap with ASCUS, but HPV Negative. C/O intermittent lower abdominal pressure and intermittent sharp pelvic pain.  3rd Trimester: Patient c/o tooth pain. Evaluated for DFM with BPP 4/8 and reactive NST--repeat Normal.  C/O Occassional contractions Last evaluation:  10/14/2016 in office with DR. AVS Weight 245lbs, BP 110/70  OB History    Gravida Para Term Preterm AB Living   4 2 1 1 1 1    SAB TAB Ectopic Multiple Live Births   1     0 1     Past Medical History:  Diagnosis Date  . Preterm labor    Past Surgical History:  Procedure Laterality Date  . CESAREAN SECTION N/A 07/09/2015   Procedure: CESAREAN SECTION;  Surgeon: Osborn Coho, MD;  Location: [facility];  Service: Obstetrics;  Laterality: N/A;  . extraction of wisdom    . WISDOM TOOTH EXTRACTION  2015   Family History: family history includes Alcohol abuse in her maternal grandfather; COPD in her maternal grandfather; Diabetes in her paternal aunt and paternal grandmother. Social History:  reports that she has never smoked. She has never used smokeless tobacco. She reports that she does not drink alcohol or use drugs.  Prenatal Transfer Tool  Maternal Diabetes: No Genetic Screening: Declined Maternal Ultrasounds/Referrals: Normal Fetal Ultrasounds or other Referrals:  None Maternal Substance Abuse:  No Significant Maternal Medications:  None Significant Maternal Lab Results: Lab values include: Group B Strep positive    ROS:  +ctx, -lof, +fm, +black floaters  No Known Allergies   Dilation: 5 Effacement (%): 80 Station:  -2 Exam by:: Druscilla Brownie. Neill RNC Blood pressure 132/81, pulse 74, temperature 98.5 F (36.9 C), temperature source Oral, resp. rate 20, height 5\' 2"  (1.575 m), weight 111.1 kg (245 lb), last menstrual period 01/20/2016, SpO2 100 %, currently breastfeeding.  Physical Exam  Vitals reviewed. Constitutional: She is oriented to person, place, and time. She appears well-developed and well-nourished. No distress.  HENT:  Head: Normocephalic and atraumatic.  Eyes: Conjunctivae and EOM are normal.  Neck: Normal range of motion.  Cardiovascular: Normal rate, regular rhythm and normal heart sounds.   Respiratory: Effort normal and breath sounds normal.  GI: Soft. Bowel sounds are normal.  Gravid  Genitourinary: Vagina normal. Rectal exam shows external hemorrhoid.  Genitourinary Comments: Rash noted on thighs. No signs or lesions significant for HSV.   Musculoskeletal: Normal range of motion. She exhibits no edema.  Neurological: She is alert and oriented to person, place, and time.  Skin: Skin is warm and dry.  Psychiatric: She has a normal mood and affect. Her behavior is normal.    [place]: Not assessed EFW:  Presentation: Vertex by Exam  FHR: 135 bpm, Mod Var, -Decels, +Accels UCs:  Q1-193min, palpates mild to moderate  Prenatal labs: ABO, Rh:  O Positive Antibody:  Negative Rubella:  Immune RPR:   NR HBsAg:   Negative HIV:   NR GBS:  Positive Sickle cell/Hgb electrophoresis:  Normal Pap:  ASCUS GC:  Negative Chlamydia:  Negative Other:  None    Assessment IUP at 38.1wks Cat I FT TOLAC GBS Positive Morbid Obesity H/O HSV Marginal Cord Insertion Visual Disturbances   Plan: [facility] consult with Dr. AVS Routine Labor and Delivery Orders per [facility] Protocol In room to complete assessment and discuss POC: Will monitor closely for deviation from labor curve Allow for natural progression of labor AROM after 2nd bag of PCN has been infused PIH labs for  analysis Will consider pitocin if necessary, but not initially Dr.J. Charlotta Newtonzan to be updated as appropriate   Joellyn QuailsJessica L EmlyCNM, MSN 10/20/2016, 7:32 PM

## 2016-10-21 ENCOUNTER — Encounter (HOSPITAL_COMMUNITY): Payer: Self-pay

## 2016-10-21 DIAGNOSIS — O34219 Maternal care for unspecified type scar from previous cesarean delivery: Secondary | ICD-10-CM | POA: Diagnosis not present

## 2016-10-21 LAB — CBC
HCT: 31.3 % — ABNORMAL LOW (ref 36.0–46.0)
HEMOGLOBIN: 10.8 g/dL — AB (ref 12.0–15.0)
MCH: 30.8 pg (ref 26.0–34.0)
MCHC: 34.5 g/dL (ref 30.0–36.0)
MCV: 89.2 fL (ref 78.0–100.0)
PLATELETS: 170 10*3/uL (ref 150–400)
RBC: 3.51 MIL/uL — ABNORMAL LOW (ref 3.87–5.11)
RDW: 14.1 % (ref 11.5–15.5)
WBC: 17.9 10*3/uL — ABNORMAL HIGH (ref 4.0–10.5)

## 2016-10-21 LAB — RPR: RPR Ser Ql: NONREACTIVE

## 2016-10-21 MED ORDER — BENZOCAINE-MENTHOL 20-0.5 % EX AERO
1.0000 "application " | INHALATION_SPRAY | CUTANEOUS | Status: DC | PRN
  Administered 2016-10-21: 1 via TOPICAL
  Filled 2016-10-21 (×2): qty 56

## 2016-10-21 MED ORDER — SIMETHICONE 80 MG PO CHEW: 80.0000 mg | CHEWABLE_TABLET | ORAL | Status: DC | PRN

## 2016-10-21 MED ORDER — ZOLPIDEM TARTRATE 5 MG PO TABS: 5.0000 mg | ORAL_TABLET | Freq: Every evening | ORAL | Status: DC | PRN

## 2016-10-21 MED ORDER — WITCH HAZEL-GLYCERIN EX PADS: 1.0000 "application " | MEDICATED_PAD | CUTANEOUS | Status: DC | PRN

## 2016-10-21 MED ORDER — ONDANSETRON HCL 4 MG/2ML IJ SOLN: 4.0000 mg | INTRAMUSCULAR | Status: DC | PRN

## 2016-10-21 MED ORDER — DIPHENHYDRAMINE HCL 25 MG PO CAPS: 25.0000 mg | ORAL_CAPSULE | Freq: Four times a day (QID) | ORAL | Status: DC | PRN

## 2016-10-21 MED ORDER — IBUPROFEN 600 MG PO TABS
600.0000 mg | ORAL_TABLET | Freq: Four times a day (QID) | ORAL | Status: DC
  Administered 2016-10-21 – 2016-10-23 (×10): 600 mg via ORAL
  Filled 2016-10-21 (×10): qty 1

## 2016-10-21 MED ORDER — COCONUT OIL OIL
1.0000 "application " | TOPICAL_OIL | Status: DC | PRN
  Filled 2016-10-21: qty 120

## 2016-10-21 MED ORDER — ONDANSETRON HCL 4 MG PO TABS: 4.0000 mg | ORAL_TABLET | ORAL | Status: DC | PRN

## 2016-10-21 MED ORDER — PRENATAL MULTIVITAMIN CH
1.0000 | ORAL_TABLET | Freq: Every day | ORAL | Status: DC
  Administered 2016-10-22 – 2016-10-23 (×2): 1 via ORAL
  Filled 2016-10-21 (×3): qty 1

## 2016-10-21 MED ORDER — TETANUS-DIPHTH-ACELL PERTUSSIS 5-2.5-18.5 LF-MCG/0.5 IM SUSP: 0.5000 mL | Freq: Once | INTRAMUSCULAR | Status: DC

## 2016-10-21 MED ORDER — SENNOSIDES-DOCUSATE SODIUM 8.6-50 MG PO TABS
2.0000 | ORAL_TABLET | ORAL | Status: DC
  Administered 2016-10-22 – 2016-10-23 (×2): 2 via ORAL
  Filled 2016-10-21 (×2): qty 2

## 2016-10-21 MED ORDER — DIBUCAINE 1 % RE OINT: 1.0000 "application " | TOPICAL_OINTMENT | RECTAL | Status: DC | PRN

## 2016-10-21 MED ORDER — ACETAMINOPHEN 325 MG PO TABS
650.0000 mg | ORAL_TABLET | ORAL | Status: DC | PRN
  Administered 2016-10-21: 650 mg via ORAL
  Administered 2016-10-21 (×2): 325 mg via ORAL
  Administered 2016-10-22 (×3): 650 mg via ORAL
  Filled 2016-10-21 (×6): qty 2

## 2016-10-21 NOTE — Progress Notes (Signed)
Tina [name] MRN: [id number]  Subjective: -Strip Reviewed  Objective: BP (!) 96/47   Pulse 72   Temp 97.6 F (36.4 C) (Oral)   Resp 20   Ht 5\' 2"  (1.575 m)   Wt 111.1 kg (245 lb)   LMP 01/20/2016   SpO2 99%   BMI 44.81 kg/m  No intake/output data recorded. No intake/output data recorded.  Results for orders placed or performed during the hospital encounter of 10/20/16 (from the past 24 hour(s))  Rapid HIV screen (HIV 1/2 Ab+Ag) ([facility] Only)     Status: None   Collection Time: 10/20/16  7:45 PM  Result Value Ref Range   HIV-1 P24 Antigen - HIV24 NON REACTIVE NON REACTIVE   HIV 1/2 Antibodies NON REACTIVE NON REACTIVE   Interpretation (HIV Ag Ab)      A non reactive test result means that HIV 1 or HIV 2 antibodies and HIV 1 p24 antigen were not detected in the specimen.  CBC     Status: Abnormal   Collection Time: 10/20/16  7:45 PM  Result Value Ref Range   WBC 11.2 (H) 4.0 - 10.5 K/uL   RBC 4.21 3.87 - 5.11 MIL/uL   Hemoglobin 13.0 12.0 - 15.0 g/dL   HCT 40.9 81.1 - 91.4 %   MCV 89.3 78.0 - 100.0 fL   MCH 30.9 26.0 - 34.0 pg   MCHC 34.6 30.0 - 36.0 g/dL   RDW 78.2 95.6 - 21.3 %   Platelets 204 150 - 400 K/uL  Type and screen [facility]     Status: None   Collection Time: 10/20/16  7:45 PM  Result Value Ref Range   ABO/RH(D) O POS    Antibody Screen NEG    Sample Expiration 10/23/2016   Lactate dehydrogenase     Status: None   Collection Time: 10/20/16  7:45 PM  Result Value Ref Range   LDH 153 98 - 192 U/L  Uric acid     Status: None   Collection Time: 10/20/16  7:45 PM  Result Value Ref Range   Uric Acid, Serum 5.7 2.3 - 6.6 mg/dL  Comprehensive metabolic panel     Status: Abnormal   Collection Time: 10/20/16  7:45 PM  Result Value Ref Range   Sodium 136 135 - 145 mmol/L   Potassium 3.9 3.5 - 5.1 mmol/L   Chloride 107 101 - 111 mmol/L   CO2 21 (L) 22 - 32 mmol/L   Glucose, Bld 74 65 - 99 mg/dL   BUN 8 6 - 20 mg/dL   Creatinine, Ser  0.86 0.44 - 1.00 mg/dL   Calcium 9.6 8.9 - 57.8 mg/dL   Total Protein 7.0 6.5 - 8.1 g/dL   Albumin 2.9 (L) 3.5 - 5.0 g/dL   AST 24 15 - 41 U/L   ALT 21 14 - 54 U/L   Alkaline Phosphatase 129 (H) 38 - 126 U/L   Total Bilirubin 0.6 0.3 - 1.2 mg/dL   GFR calc non Af Amer >60 >60 mL/min   GFR calc Af Amer >60 >60 mL/min   Anion gap 8 5 - 15  Protein / creatinine ratio, urine     Status: Abnormal   Collection Time: 10/20/16  9:10 PM  Result Value Ref Range   Creatinine, Urine 147.00 mg/dL   Total Protein, Urine 43 mg/dL   Protein Creatinine Ratio 0.29 (H) 0.00 - 0.15 mg/mg[Cre]    Fetal Monitoring: FHT: 135 bpm, Mod Var, -Decels, +Accels UC:  None graphed    Vaginal Exam: SVE:   Dilation: 8 Effacement (%): 90 Station: -1 Exam by:: Tina [name]  Membranes:Intact Internal Monitors: None  Augmentation/Induction: Pitocin:None Cytotec: None  Assessment:  IUP at 38.2wks Cat I FT  TOLAC Transitional Labor GBS Positive  Plan: -Progressive Labor -Consider ROM s/p 2nd dose PCN -PC Ratio 0.29; will discuss with MD regarding PP MgSO4 vs repeat labs and observation -Continue other mgmt as ordered  Tina CavaJessica L Morgen Linebaugh,MSN, CNM 10/21/2016, 12:46 AM

## 2016-10-21 NOTE — Anesthesia Postprocedure Evaluation (Signed)
Anesthesia Post Note  Patient: Tina [name]  Procedure(s) Performed: * No procedures listed *  Patient location during evaluation: Mother Baby Anesthesia Type: Epidural Level of consciousness: awake, awake and alert, oriented and patient cooperative Pain management: pain level controlled Vital Signs Assessment: post-procedure vital signs reviewed and stable Respiratory status: spontaneous breathing, nonlabored ventilation and respiratory function stable Cardiovascular status: stable Postop Assessment: patient able to bend at knees, no headache, no signs of nausea or vomiting and no backache Anesthetic complications: no     Last Vitals:  Vitals:   10/21/16 0530 10/21/16 0630  BP: 123/70 126/72  Pulse: 69 79  Resp: 16 18  Temp: 36.8 C 36.7 C    Last Pain:  Vitals:   10/21/16 0630  TempSrc: Oral  PainSc: 2    Pain Goal:                 Sabine Tenenbaum L

## 2016-10-21 NOTE — Progress Notes (Signed)
Tina NoraCristina [name] MRN: [id number]  Subjective: -In room to assess.  Patient reports rectal pressure, but states it is only with contractions.  FOB, Apolinar JunesBrandon, at bedside.  Objective: BP 124/76   Pulse 70   Temp 97.6 F (36.4 C) (Oral)   Resp 20   Ht 5\' 2"  (1.575 m)   Wt 111.1 kg (245 lb)   LMP 01/20/2016   SpO2 99%   BMI 44.81 kg/m  No intake/output data recorded. No intake/output data recorded.   Fetal Monitoring: FHT: 135 bpm, Mod Var, -Decels, +Accels UC: Palpates moderate    Vaginal Exam: SVE:   Dilation: Lip/rim Effacement (%): 100 Station: +2 Exam by:: J. Eneida Evers Cnm  Membranes:AROM of small amt clear fluid Internal Monitors: None  Augmentation/Induction: Pitocin:None Cytotec: None  Assessment:  IUP at 38.2wks Cat I FT  TOLAC Amniotomy GBS Positive  Plan: -Progressive Labor -High fowlers to labor down -Discussed pushing methods -Continue other mgmt as ordered -Anticipate SVD  Joyce CopaJessica L Matas Burrows,MSN, CNM 10/21/2016, 2:15 AM

## 2016-10-21 NOTE — Progress Notes (Signed)
Pt. Ambulating well in room, denies Ha/blurred vision/chest pain. Light lochia.  Moderate LE edema bilat.  Pt. Has saline lock in left forearm, flushed by RN with NS and flushed appropriately.

## 2016-10-21 NOTE — Lactation Note (Signed)
This note was copied from a baby's chart. Lactation Consultation Note Initial visit at 6 hours of age.  Mom has experience with now 115 month old breast feeding exclusively for 10 months.  MOm reports baby is not getting on deeply and her right nipple is a little sore.  Mom is able to easily express several drops of colostrum.  LC assisted with STS in cross cradle hold, baby is not eager to eat and mom may need more support learning this position.  Baby remains STS with mom.   Discussed offering spoon feedings as appetizer or after feedings as needed due to baby being DAT+.  Mom will call RN for assist with spoon feeding as needed.   [facility] given and discussed.  Encouraged to feed with early cues on demand.  Early newborn behavior discussed.  Mom to call for assist as needed.    Patient Name: Tina [name] Reason for consult: Initial assessment   Maternal Data Has patient been taught Hand Expression?: Yes  Feeding Feeding Type: Breast Fed Length of feed: 16 min  LATCH Score/Interventions Latch: Too sleepy or reluctant, no latch achieved, no sucking elicited. Intervention(s): Skin to skin;Teach feeding cues;Waking techniques  Audible Swallowing: None  Type of Nipple: Everted at rest and after stimulation (center of nipple inverted, everts well with stimulation)  Comfort (Breast/Nipple): Soft / non-tender     Hold (Positioning): Assistance needed to correctly position infant at breast and maintain latch. Intervention(s): Breastfeeding basics reviewed;Support Pillows;Position options;Skin to skin  LATCH Score: 5  Lactation Tools Discussed/Used WIC Program: Yes   Consult Status Consult Status: Follow-up Date: 10/22/16 Follow-up type: In-patient    [name], [name], 9:28 AM

## 2016-10-22 NOTE — Lactation Note (Addendum)
This note was copied from a baby's chart. Lactation Consultation Note  Patient Name: Tina [name] ZOXWR'UToday's Date: 10/22/2016     Upon entering baby crying in crib w/ phototherapy lights. Mother getting ready to give baby bottle of formula. Suggest trying to bf first. Assisted mother to latch baby w/ lights on but baby too sleepy. Mother states baby recently received a small amount of pumped breastmilk and 15 ml of formula. Family will increase volume as baby desires. Recommend she breastfeed before offering formula to help establish her milk supply. Encouraged mother to keep pumping w/ DEBP for 10-20 min. Discussed jaundice baby and sleepiness. Suggest family call if they need further assistance.    Maternal Data    Feeding Feeding Type: Bottle Fed - Formula Nipple Type: Slow - flow  LATCH Score/Interventions                      Lactation Tools Discussed/Used     Consult Status      Hardie PulleyBerkelhammer, Gaetano Romberger Boschen 10/22/2016, 8:16 PM

## 2016-10-22 NOTE — Progress Notes (Signed)
Post Partum Day 1  Subjective: no complaints, up ad lib, voiding, tolerating PO and + flatus  Objective: Blood pressure 108/74, pulse 71, temperature 97.7 F (36.5 C), temperature source Oral, resp. rate 18, height 5\' 2"  (1.575 m), weight 245 lb (111.1 kg), last menstrual period 01/20/2016, SpO2 98 %, unknown if currently breastfeeding.  Physical Exam:  General: alert and no distress Lochia: appropriate Uterine Fundus: firm Incision: n/a DVT Evaluation: No evidence of DVT seen on physical exam.   Recent Labs  10/20/16 1945 10/21/16 0616  HGB 13.0 10.8*  HCT 37.6 31.3*    Assessment/Plan: Plan for discharge tomorrow Cont routine PP care Undecided about BC    LOS: 2 days   Jia Mohamed Y 10/22/2016, 10:31 AM

## 2016-10-23 MED ORDER — IBUPROFEN 600 MG PO TABS: 600.0000 mg | ORAL_TABLET | Freq: Four times a day (QID) | ORAL | 0 refills | Status: DC

## 2016-10-23 NOTE — Lactation Note (Addendum)
This note was copied from a baby's chart. Lactation Consultation Note Experienced BF mom, baby on DPT. Baby had 8% weight loss. Large output. 10 voids, 7 stools, at 3 emesis. Mom is BF, formula feeding and giving colostrum from DEBP. Encouraged mom to hand express after pumping to collect more colostrum. Encouraged to give colostrum before formula. Mom had baby laying in bed on DPT, had goggles off. Mom stated baby wouldn't sleep with them on so she left them off for a while. Encouraged to put them on to protect the baby's eyes. Mom stated she's leaving them off while the baby is asleep. Reported to RN.  Patient Name: Tina [name] ONGEX'BToday's Date: 10/23/2016 Reason for consult: Follow-up assessment;Infant weight loss;Hyperbilirubinemia   Maternal Data Does the patient have breastfeeding experience prior to this delivery?: Yes  Feeding Feeding Type: Breast Milk with Formula added Nipple Type: Slow - flow Length of feed: 15 min  LATCH Score/Interventions Latch: Repeated attempts needed to sustain latch, nipple held in mouth throughout feeding, stimulation needed to elicit sucking reflex. Intervention(s): Skin to skin  Audible Swallowing: A few with stimulation Intervention(s): Skin to skin  Type of Nipple: Everted at rest and after stimulation  Comfort (Breast/Nipple): Soft / non-tender     Hold (Positioning): No assistance needed to correctly position infant at breast.  LATCH Score: 8  Lactation Tools Discussed/Used Tools: Pump Breast pump type: Double-Electric Breast Pump Pump Review: Setup, frequency, and cleaning;Milk Storage Initiated by:: RN Date initiated:: 10/22/16   Consult Status Consult Status: Follow-up Date: 10/23/16 (in pm) Follow-up type: In-patient    Champagne Paletta, Diamond NickelLAURA G 10/23/2016, 1:56 AM

## 2016-10-23 NOTE — Discharge Summary (Signed)
Vaginal Delivery Discharge Summary  Tina [name]  DOB:    [birth year] MRN:    [id number] CSN:    [id number]  Date of admission:                  10/20/2016   Date of discharge:                  10/23/2016  Procedures this admission:   Succesful Vaginal Birth after a cesarean section (VBAC)   Date of Delivery: 10/21/2016  Newborn Data:  Live born female  Birth Weight: 6 lb 11.4 oz (3045 g) APGAR: 8, 9  Home with mother. Name: Tina [name]  History of Present Illness:  [name] is a 25 y.o. female, [phone], who presents at 2380w2d weeks gestation. [facility] She was admitted for onset of labor. Her pregnancy has been complicated by:  Patient Active Problem List   Diagnosis Date Noted  . VBAC, delivered, current hospitalization 10/21/2016  . Second-degree perineal laceration, with delivery 10/21/2016  . Periurethral laceration, delivered, current hospitalization 10/21/2016  . Encounter for trial of labor 10/20/2016  . Decreased fetal movement during pregnancy in third trimester, antepartum 09/16/2016  . Marginal insertion of umbilical cord 09/16/2016  . Previous cesarean section 09/16/2016  . Hx of incompetent cervix, currently pregnant 09/16/2016  . BMI 40.0-44.9, adult (HCC) 09/16/2016  . HPV test positive 09/16/2016  . 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 Preterm delivery--2015, 22 weeks, iufd 07/08/2015  . Positive GBS test 07/08/2015  . Rubella non-immune status, antepartum 07/08/2015     Hospital Course:   GBS Pos. Utilized epidural for pain management.  Delivery was performed by Tina [name], CNM without complication. Patient and baby tolerated the procedure without difficulty, with second degree laceration noted. Infant status was stable and remained in room with mother.  Mother then had an uncomplicated postpartum course, with breast and bottle feeding going  well. Mom's physical exam was WNL, and she was discharged home in stable condition. Contraception plan was undecided.  She received adequate benefit from po pain medications.  Infant required longer stay in hospital due to jaundice.   Intrapartum Procedures: VBAC Postpartum Procedures: none Complications-Operative and Postpartum: none  Discharge Diagnoses: Term Pregnancy-delivered  Hemoglobin Results:  CBC Latest Ref Rng & Units 10/21/2016 10/20/2016 03/21/2016  WBC 4.0 - 10.5 K/uL 17.9(H) 11.2(H) 9.0  Hemoglobin 12.0 - 15.0 g/dL 10.8(L) 13.0 13.0  Hematocrit 36.0 - 46.0 % 31.3(L) 37.6 37.7  Platelets 150 - 400 K/uL 170 204 238    Prenatal labs: ABO, Rh:  O Positive Antibody:  Negative Rubella:  Immune RPR:   NR HBsAg:   Negative HIV:   NR GBS:  Positive Sickle cell/Hgb electrophoresis:  Normal Pap:  ASCUS GC:  Negative Chlamydia:  Negative Other:  None   Discharge Physical Exam:   General: alert, cooperative and no distress Lochia: appropriate Uterine Fundus: firm DVT Evaluation: No evidence of DVT seen on physical exam. Calf/Ankle edema is present.   Discharge Information:  Activity:           pelvic rest Diet:                Regular Medications: PNV and Ibuprofen Condition:      stable Instructions:  Routine pp instructions   Discharge to: home (bit may board room while baby finishes phototherapy).       Tina [name],Tina Acton WAKURU MD 10/23/2016 7:59  AM

## 2016-10-23 NOTE — Lactation Note (Signed)
This note was copied from a baby's chart. Lactation Consultation Note  Patient Name: Tina [name] ZOXWR'UToday's Date: 10/23/2016 Reason for consult: Follow-up assessment;Hyperbilirubinemia Assisted Mom with positioning and obtaining more depth with latch. Demonstrated ways to keep baby awake and engaged at breast for longer periods. Encouraged Mom to try and BF for up to 15 minutes both breasts most feedings if baby under 1 bili blanket. Demonstrated to Mom how to un-tuck lips and use breast compression to help baby obtain more depth. Advised Mom to supplement with each feeding EBM or formula. Supplemental guidelines per hours of age reviewed with Mom if breast feeding or pump/bottle feeding.  Encouraged to continue to post pump every 3 hours for 15 minutes to encourage milk production and to have EBM to supplement. Mom reports she does have DEBP at home. Encouraged Mom to call for assist as needed with latch.   Maternal Data    Feeding Feeding Type: Breast Fed Length of feed: 15 min  LATCH Score/Interventions Latch: Grasps breast easily, tongue down, lips flanged, rhythmical sucking. Intervention(s): Adjust position;Assist with latch;Breast massage;Breast compression  Audible Swallowing: Spontaneous and intermittent  Type of Nipple: Everted at rest and after stimulation  Comfort (Breast/Nipple): Soft / non-tender     Hold (Positioning): Assistance needed to correctly position infant at breast and maintain latch. Intervention(s): Breastfeeding basics reviewed;Support Pillows;Position options;Skin to skin  LATCH Score: 9  Lactation Tools Discussed/Used Tools: Pump Breast pump type: Double-Electric Breast Pump   Consult Status Consult Status: Follow-up Date: 10/24/16 Follow-up type: In-patient    [name], [place] Ann 10/23/2016, 11:57 AM

## 2016-10-24 ENCOUNTER — Ambulatory Visit: Payer: Self-pay

## 2016-10-24 NOTE — Lactation Note (Signed)
This note was copied from a baby's chart. Lactation Consultation Note  Patient Name: Tina [name] ZOXWR'UToday's Date: 10/24/2016 Reason for consult: Follow-up assessment;Hyperbilirubinemia;Infant weight loss;Other (Comment) (per mom  recently fed at 0800, and repeat Bili mid day . 6% weight loss )  LC reviewed supply and demand  And the importance of consistent pumping until the jaundice level decreases and also until the milk comes in. Per mom has pumped either one breast  Or two at a time. And has got'en 2 pumping 20 ml. @ consult LC cleaned pump pieces and reviewed with mom .reviewed DEBP and the importance of pumping both breast at a time, ( enhance  Milk let down). Mom started out with #27 flanges and they appeared to large. Switched to #24 Flanges and when mom was pumping mom felt discomfort so LC switched mom back to #27.  LC reminded mom when pumping allow her breast tissue to lay naturally so her breast tissue will mold in the flange and hold the bottles like handles. Mom appeared comfortable.  Mom plans to pump 15 -20 mins , save milk for next feeding.  Repeat Bili today , and baby is still on Double Photo and both lights intact when LC in room and baby sleeping.  Both mom and dad receptive to teaching.    Maternal Data    Feeding Feeding Type: Breast Fed Nipple Type: Slow - flow Length of feed: 10 min  LATCH Score/Interventions                Intervention(s): Breastfeeding basics reviewed     Lactation Tools Discussed/Used Tools: Pump Breast pump type: Double-Electric Breast Pump Pump Review: Setup, frequency, and cleaning Initiated by:: reviewed and rechecked flange size - #27 to start appear to big , switched to #24 , and has mom was pumping felt uncomfortable. LC switched back to #27 , more comfortable per mom    Consult Status Consult Status: Follow-up Date: 10/24/16 Follow-up type: In-patient    Matilde SprangMargaret Ann Luis Sami 10/24/2016, 9:42 AM

## 2016-10-24 NOTE — Lactation Note (Signed)
This note was copied from a baby's chart. Lactation Consultation Note  Patient Name: Tina Kerby NoraCristina Meiklejohn RUEAV'WToday's Date: 10/24/2016 Reason for consult: Follow-up assessment  Visited with Mom on day of probable discharge, serum bilirubin drawn at 6pm.  Mom concerned that baby wouldn't take her breast the last two feedings.  Assisted with cross cradle hold, and baby latched easily.  Mom stated it hurt, so took baby off and assisted her to re-latch waiting for baby to open widely.  Showed her how to gently pull on baby's chin to open her mouth wider.  Discomfort eased up.  Multiple swallows identified.  Reassured Mom that baby looked good on the breast.  Reviewed basics again with her.  OP lactation appointment made for 11/3 @ 9 am for feeding assessment as Mom is full of questions about when to stop bottle feeding and pumping.  Engorgement prevention and treatment discussed.  Recommended STS and cue based feedings. Reminded her of OP lactation services and encouraged her to call prn.        Consult Status Consult Status: Follow-up Date: 10/31/16 Follow-up type: Out-patient    [name], [name] 10/24/2016, 6:22 PM

## 2016-10-25 ENCOUNTER — Ambulatory Visit: Payer: Self-pay

## 2016-10-25 NOTE — Lactation Note (Signed)
This note was copied from a baby's chart. Lactation Consultation Note  Patient Name: Tina [name] Reason for consult: Follow-up assessment   With this mom and term baby, now 674 days old and term. Mom haas been breastfeeding and formula bottle feeding. She was pumping when I walked in the room. Mom said she tried to latch the baby, but she was sleepy. Mom was leaking, so she pumped, and is expressing at least an ounce or more from each breast. Mom said she plans to exclusive breastfeed at home, and pump to save milk for when she goes back to work. Mom encouraged a t this time, to use EBM over formula to supplement baby, if needed. Mom knows to call for questions/concerns.    Maternal Data    Feeding Length of feed: 5 min  LATCH Score/Interventions Latch: Grasps breast easily, tongue down, lips flanged, rhythmical sucking.  Audible Swallowing: Spontaneous and intermittent  Type of Nipple: Everted at rest and after stimulation  Comfort (Breast/Nipple): Soft / non-tender     Hold (Positioning): No assistance needed to correctly position infant at breast.  LATCH Score: 10  Lactation Tools Discussed/Used     Consult Status Consult Status: Complete Follow-up type: Call as needed    Tina [name], Tina [name] [name], 10:57 AM

## 2016-10-31 ENCOUNTER — Ambulatory Visit (HOSPITAL_COMMUNITY): Admission: RE | Admit: 2016-10-31 | Payer: Medicaid Other | Source: Ambulatory Visit

## 2016-11-06 ENCOUNTER — Encounter (HOSPITAL_COMMUNITY): Admission: RE | Admit: 2016-11-06 | Payer: Medicaid Other | Source: Ambulatory Visit

## 2017-01-26 ENCOUNTER — Emergency Department (HOSPITAL_COMMUNITY)
Admission: EM | Admit: 2017-01-26 | Discharge: 2017-01-26 | Disposition: A | Discharge status: Home / Self Care | Payer: Medicaid Other | Attending: Emergency Medicine | Admitting: Emergency Medicine

## 2017-01-26 ENCOUNTER — Encounter (HOSPITAL_COMMUNITY): Payer: Self-pay | Admitting: Emergency Medicine

## 2017-01-26 DIAGNOSIS — K529 Noninfective gastroenteritis and colitis, unspecified: Secondary | ICD-10-CM | POA: Insufficient documentation

## 2017-01-26 LAB — CBC
HCT: 43.6 % (ref 36.0–46.0)
HEMOGLOBIN: 15.2 g/dL — AB (ref 12.0–15.0)
MCH: 30.2 pg (ref 26.0–34.0)
MCHC: 34.9 g/dL (ref 30.0–36.0)
MCV: 86.5 fL (ref 78.0–100.0)
Platelets: 296 10*3/uL (ref 150–400)
RBC: 5.04 MIL/uL (ref 3.87–5.11)
RDW: 13.7 % (ref 11.5–15.5)
WBC: 8.1 10*3/uL (ref 4.0–10.5)

## 2017-01-26 LAB — COMPREHENSIVE METABOLIC PANEL
ALBUMIN: 4.5 g/dL (ref 3.5–5.0)
ALK PHOS: 67 U/L (ref 38–126)
ALT: 42 U/L (ref 14–54)
AST: 23 U/L (ref 15–41)
Anion gap: 7 (ref 5–15)
BUN: 9 mg/dL (ref 6–20)
CALCIUM: 9.2 mg/dL (ref 8.9–10.3)
CHLORIDE: 111 mmol/L (ref 101–111)
CO2: 19 mmol/L — ABNORMAL LOW (ref 22–32)
CREATININE: 0.49 mg/dL (ref 0.44–1.00)
GFR calc Af Amer: >60 mL/min (ref >60)
GFR calc non Af Amer: >60 mL/min (ref >60)
GLUCOSE: 92 mg/dL (ref 65–99)
Potassium: 3.6 mmol/L (ref 3.5–5.1)
SODIUM: 137 mmol/L (ref 135–145)
Total Bilirubin: 0.7 mg/dL (ref 0.3–1.2)
Total Protein: 8.6 g/dL — ABNORMAL HIGH (ref 6.5–8.1)

## 2017-01-26 LAB — URINALYSIS, ROUTINE W REFLEX MICROSCOPIC
Bilirubin Urine: NEGATIVE
GLUCOSE, UA: NEGATIVE mg/dL
KETONES UR: NEGATIVE mg/dL
Leukocytes, UA: NEGATIVE
Nitrite: NEGATIVE
PROTEIN: 30 mg/dL — AB
Specific Gravity, Urine: 1.028 (ref 1.005–1.030)
pH: 5 (ref 5.0–8.0)

## 2017-01-26 LAB — I-STAT BETA HCG BLOOD, ED (MC, WL, AP ONLY)

## 2017-01-26 LAB — LIPASE, BLOOD: Lipase: 22 U/L (ref 11–51)

## 2017-01-26 MED ORDER — PROMETHAZINE HCL 25 MG/ML IJ SOLN
12.5000 mg | Freq: Once | INTRAMUSCULAR | Status: AC
  Administered 2017-01-26: 12.5 mg via INTRAVENOUS
  Filled 2017-01-26: qty 1

## 2017-01-26 MED ORDER — SODIUM CHLORIDE 0.9 % IV BOLUS (SEPSIS)
1000.0000 mL | Freq: Once | INTRAVENOUS | Status: AC
  Administered 2017-01-26: 1000 mL via INTRAVENOUS

## 2017-01-26 MED ORDER — PROCHLORPERAZINE MALEATE 10 MG PO TABS: 10.0000 mg | ORAL_TABLET | Freq: Two times a day (BID) | ORAL | 0 refills | Status: DC | PRN

## 2017-01-26 MED ORDER — LOPERAMIDE HCL 2 MG PO CAPS: 2.0000 mg | ORAL_CAPSULE | Freq: Four times a day (QID) | ORAL | 0 refills | Status: DC | PRN

## 2017-01-26 NOTE — ED Provider Notes (Signed)
[facility]-EMERGENCY DEPT Provider Note   CSN: [id number] Arrival date & time: 01/26/17  0754     History   Chief Complaint Chief Complaint  Patient presents with  . Abdominal Pain    HPI Tina [name] is a 26 y.o. female.  HPI Pt started with diarrhea about 4-5 days ago.  Associated with those symptoms she started having stomach cramps.  The cramps come and go.  They start when she has the diarrhea which occurs 5 times per day.   She has had some nausea and vomiting , 1-2 times per day.  No blood noted.  No fevers.   No recent travel.  No abx recently.   No ill contacts.  Pt is breastfeeding. Past Medical History:  Diagnosis Date  . Preterm labor     Patient Active Problem List   Diagnosis Date Noted  . VBAC, delivered, current hospitalization 10/21/2016  . Second-degree perineal laceration, with delivery 10/21/2016  . Periurethral laceration, delivered, current hospitalization 10/21/2016  . Encounter for trial of labor 10/20/2016  . Decreased fetal movement during pregnancy in third trimester, antepartum 09/16/2016  . Marginal insertion of umbilical cord 09/16/2016  . Previous cesarean section 09/16/2016  . Hx of incompetent cervix, currently pregnant 09/16/2016  . BMI 40.0-44.9, adult (HCC) 09/16/2016  . HPV test positive 09/16/2016  . 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 Preterm delivery--2015, 22 weeks, iufd 07/08/2015  . Positive GBS test 07/08/2015  . Rubella non-immune status, antepartum 07/08/2015    Past Surgical History:  Procedure Laterality Date  . CESAREAN SECTION N/A 07/09/2015   Procedure: CESAREAN SECTION;  Surgeon: Osborn CohoAngela Roberts, MD;  Location: [facility];  Service: Obstetrics;  Laterality: N/A;  . extraction of wisdom    . WISDOM TOOTH EXTRACTION  2015    OB History    Gravida Para Term Preterm AB Living   4 3 2 1 1 2    SAB TAB Ectopic Multiple Live Births   1     0 2       Home Medications    Prior to Admission medications   Medication Sig Start  Date End Date Taking? Authorizing Provider  bismuth subsalicylate (PEPTO BISMOL) 262 MG/15ML suspension Take 30 mLs by mouth every 6 (six) hours as needed for diarrhea or loose stools.   Yes Historical Provider, MD  loperamide (IMODIUM) 2 MG capsule Take 1 capsule (2 mg total) by mouth 4 (four) times daily as needed for diarrhea or loose stools. 01/26/17   Linwood DibblesJon Cheryal Salas, MD  prochlorperazine (COMPAZINE) 10 MG tablet Take 1 tablet (10 mg total) by mouth 2 (two) times daily as needed for nausea or vomiting ((do not take while breast feeding)). 01/26/17   Linwood DibblesJon Joclynn Lumb, MD    Family History Family History  Problem Relation Age of Onset  . Diabetes Paternal Aunt   . Alcohol abuse Maternal Grandfather   . COPD Maternal Grandfather   . Diabetes Paternal Grandmother   . Arthritis Neg Hx   . Asthma Neg Hx   . Birth defects Neg Hx   . Depression Neg Hx   . Drug abuse Neg Hx   . Early death Neg Hx   . Hearing loss Neg Hx   . Heart disease Neg Hx   . Hyperlipidemia Neg Hx   . Hypertension Neg Hx   . Kidney disease Neg Hx   . Learning disabilities Neg Hx   . Mental illness Neg Hx   . Mental retardation Neg Hx   . Miscarriages /  Stillbirths Neg Hx   . Stroke Neg Hx   . Vision loss Neg Hx   . Varicose Veins Neg Hx     Social History Social History  Substance Use Topics  . Smoking status: Never Smoker  . Smokeless tobacco: Never Used  . Alcohol use No     Allergies   Patient has no known allergies.   Review of Systems Review of Systems  All other systems reviewed and are negative.    Physical Exam Updated Vital Signs BP 129/91   Pulse 87   Temp 98.2 F (36.8 C)   Resp 18   Ht 5\' 1"  (1.549 m)   Wt 99.8 kg   LMP 12/24/2016   SpO2 100%   BMI 41.57 kg/m   Physical Exam  Constitutional: She appears well-developed and well-nourished. No distress.  HENT:  Head: Normocephalic and atraumatic.  Right Ear: External ear normal.  Left Ear: External ear normal.  Eyes: Conjunctivae  are normal. Right eye exhibits no discharge. Left eye exhibits no discharge. No scleral icterus.  Neck: Neck supple. No tracheal deviation present.  Cardiovascular: Normal rate, regular rhythm and intact distal pulses.   Pulmonary/Chest: Effort normal and breath sounds normal. No stridor. No respiratory distress. She has no wheezes. She has no rales.  Abdominal: Soft. Bowel sounds are normal. She exhibits no distension. There is no tenderness. There is no rebound and no guarding.  Musculoskeletal: She exhibits no edema or tenderness.  Neurological: She is alert. She has normal strength. No cranial nerve deficit (no facial droop, extraocular movements intact, no slurred speech) or sensory deficit. She exhibits normal muscle tone. She displays no seizure activity. Coordination normal.  Skin: Skin is warm and dry. No rash noted.  Psychiatric: She has a normal mood and affect.  Nursing note and vitals reviewed.    ED Treatments / Results  Labs (all labs ordered are listed, but only abnormal results are displayed) Labs Reviewed  COMPREHENSIVE METABOLIC PANEL - Abnormal; Notable for the following:       Result Value   CO2 19 (*)    Total Protein 8.6 (*)    All other components within normal limits  CBC - Abnormal; Notable for the following:    Hemoglobin 15.2 (*)    All other components within normal limits  URINALYSIS, ROUTINE W REFLEX MICROSCOPIC - Abnormal; Notable for the following:    APPearance CLOUDY (*)    Hgb urine dipstick MODERATE (*)    Protein, ur 30 (*)    Bacteria, UA RARE (*)    Squamous Epithelial / LPF 6-30 (*)    All other components within normal limits  LIPASE, BLOOD  I-STAT BETA HCG BLOOD, ED ([facility], [facility], [facility] ONLY)     Radiology No results found.  Procedures Procedures (including critical care time)  Medications Ordered in ED Medications  promethazine (PHENERGAN) injection 12.5 mg (12.5 mg Intravenous Given 01/26/17 0904)  sodium chloride 0.9 % bolus 1,000 mL  (1,000 mLs Intravenous New Bag/Given 01/26/17 0904)     Initial Impression / Assessment and Plan / ED Course  I have reviewed the triage vital signs and the nursing notes.  Pertinent labs & imaging results that were available during my care of the patient were reviewed by me and considered in my medical decision making (see chart for details).   patient's symptoms are suggestive of a viral gastroenteritis. Her laboratory tests are reassuring. She is not having any urinary symptoms and I think her urinalysis is  consistent with contamination and not suggestive of an acute urinary tract infection.   Patient is breast-feeding.  I reviewed breast-feeding safety [place] [employer].  Imodium is listed as safe. Compazine is listed as uncertain. She should not take the Compazine while breast feeding.  Final Clinical Impressions(s) / ED Diagnoses   Final diagnoses:  Gastroenteritis    New Prescriptions New Prescriptions   LOPERAMIDE (IMODIUM) 2 MG CAPSULE    Take 1 capsule (2 mg total) by mouth 4 (four) times daily as needed for diarrhea or loose stools.   PROCHLORPERAZINE (COMPAZINE) 10 MG TABLET    Take 1 tablet (10 mg total) by mouth 2 (two) times daily as needed for nausea or vomiting ((do not take while breast feeding)).     Linwood Dibbles, MD 01/26/17 [phone]

## 2017-01-26 NOTE — ED Triage Notes (Addendum)
Patient is complaining of abdominal pain, emesis, and diarrhea x5 days. Patient is breastfeeding.

## 2017-01-26 NOTE — ED Notes (Signed)
Bed: WA05 Expected date:  Expected time:  Means of arrival:  Comments: 

## 2017-01-26 NOTE — Discharge Instructions (Signed)
Drink plenty of fluids, avoid using the Compazine while breast-feeding,  the imodium should be OK (consult with your child's pediatrician for any questions), follow up with your doctor if not better by the end of the week

## 2017-10-04 ENCOUNTER — Encounter (HOSPITAL_COMMUNITY): Payer: Self-pay | Admitting: Emergency Medicine

## 2017-10-04 ENCOUNTER — Emergency Department (HOSPITAL_COMMUNITY)
Admission: EM | Admit: 2017-10-04 | Discharge: 2017-10-05 | Disposition: A | Discharge status: Home / Self Care | Payer: Self-pay | Attending: Physician Assistant | Admitting: Physician Assistant

## 2017-10-04 DIAGNOSIS — R55 Syncope and collapse: Secondary | ICD-10-CM

## 2017-10-04 DIAGNOSIS — Z79899 Other long term (current) drug therapy: Secondary | ICD-10-CM | POA: Insufficient documentation

## 2017-10-04 DIAGNOSIS — Z793 Long term (current) use of hormonal contraceptives: Secondary | ICD-10-CM | POA: Insufficient documentation

## 2017-10-04 LAB — I-STAT BETA HCG BLOOD, ED (MC, WL, AP ONLY): I-stat hCG, quantitative: <5 m[IU]/mL (ref <5)

## 2017-10-04 LAB — URINALYSIS, ROUTINE W REFLEX MICROSCOPIC
BILIRUBIN URINE: NEGATIVE
Glucose, UA: NEGATIVE mg/dL
Hgb urine dipstick: NEGATIVE
Ketones, ur: NEGATIVE mg/dL
Leukocytes, UA: NEGATIVE
NITRITE: NEGATIVE
PH: 6 (ref 5.0–8.0)
Protein, ur: NEGATIVE mg/dL
SPECIFIC GRAVITY, URINE: 1.028 (ref 1.005–1.030)

## 2017-10-04 LAB — BASIC METABOLIC PANEL
ANION GAP: 9 (ref 5–15)
BUN: 10 mg/dL (ref 6–20)
CHLORIDE: 104 mmol/L (ref 101–111)
CO2: 24 mmol/L (ref 22–32)
Calcium: 9.3 mg/dL (ref 8.9–10.3)
Creatinine, Ser: 0.68 mg/dL (ref 0.44–1.00)
GFR calc Af Amer: >60 mL/min (ref >60)
GLUCOSE: 119 mg/dL — AB (ref 65–99)
POTASSIUM: 3.8 mmol/L (ref 3.5–5.1)
Sodium: 137 mmol/L (ref 135–145)

## 2017-10-04 LAB — CBC
HEMATOCRIT: 40.6 % (ref 36.0–46.0)
HEMOGLOBIN: 13.8 g/dL (ref 12.0–15.0)
MCH: 31.3 pg (ref 26.0–34.0)
MCHC: 34 g/dL (ref 30.0–36.0)
MCV: 92.1 fL (ref 78.0–100.0)
Platelets: 253 10*3/uL (ref 150–400)
RBC: 4.41 MIL/uL (ref 3.87–5.11)
RDW: 13.4 % (ref 11.5–15.5)
WBC: 10.8 10*3/uL — ABNORMAL HIGH (ref 4.0–10.5)
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
## 2017-10-05 NOTE — Discharge Instructions (Signed)
As we discussed, do not drive until you're followed by a doctor and cleared for driving.  Return to the emergency department for any repeat symptoms, fevers, chest pain, difficulty breathing, abdominal pain, numbness/weakness of her arms and legs, difficulty walking or any other worsening or concerning symptoms.

## 2017-11-04 ENCOUNTER — Encounter (HOSPITAL_COMMUNITY): Payer: Self-pay | Admitting: Obstetrics

## 2018-02-11 ENCOUNTER — Encounter (HOSPITAL_COMMUNITY): Payer: Self-pay | Admitting: Emergency Medicine

## 2018-02-11 ENCOUNTER — Emergency Department (HOSPITAL_COMMUNITY)
Admission: EM | Admit: 2018-02-11 | Discharge: 2018-02-11 | Disposition: A | Discharge status: Home / Self Care | Payer: Self-pay | Attending: Emergency Medicine | Admitting: Emergency Medicine

## 2018-02-11 DIAGNOSIS — B9789 Other viral agents as the cause of diseases classified elsewhere: Secondary | ICD-10-CM | POA: Insufficient documentation

## 2018-02-11 DIAGNOSIS — Z79899 Other long term (current) drug therapy: Secondary | ICD-10-CM | POA: Insufficient documentation

## 2018-02-11 DIAGNOSIS — J069 Acute upper respiratory infection, unspecified: Secondary | ICD-10-CM | POA: Insufficient documentation

## 2018-02-11 MED ORDER — BENZONATATE 100 MG PO CAPS: 100.0000 mg | ORAL_CAPSULE | Freq: Three times a day (TID) | ORAL | 0 refills | Status: DC

## 2018-02-11 NOTE — ED Triage Notes (Signed)
Patient has cough with green-yellow sputum, body aches, congestion for 3 days.

## 2018-02-11 NOTE — Discharge Instructions (Signed)
Please read instructions below.  °You can take tylenol as needed for sore throat or body aches. Drink plenty of water.  °You can take tessalon every 8 hours as needed for cough. °Follow up with your primary care provider as needed. Return to the ER for difficulty swallowing liquids, difficulty breathing, or new or worsening symptoms. ° °

## 2018-02-11 NOTE — ED Provider Notes (Signed)
[facility]-EMERGENCY DEPT Provider Note   CSN: [id number] Arrival date & time: 02/11/18  1224     History   Chief Complaint Chief Complaint  Patient presents with  . Cough  . Nasal Congestion  . Generalized Body Aches    HPI Tina [name] is a 27 y.o. female presenting to ED with 3 days of cough and congestion.  Patient states cough is productive of yellow phlegm.  Reports subjective fever and chills, however has not taken her temperature.  Nose is also congested.  Does report sore throat with dysphasia, however no difficulty swallowing.  Denies difficulty breathing, chest pain, ear pain, abdominal pain, or other complaints.  Has not had influenza vaccine this year.  No known sick contacts.  The history is provided by the patient.    Past Medical History:  Diagnosis Date  . Preterm labor     Patient Active Problem List   Diagnosis Date Noted  . VBAC, delivered, current hospitalization 10/21/2016  . Second-degree perineal laceration, with delivery 10/21/2016  . Periurethral laceration, delivered, current hospitalization 10/21/2016  . Encounter for trial of labor 10/20/2016  . Decreased fetal movement during pregnancy in third trimester, antepartum 09/16/2016  . Marginal insertion of umbilical cord 09/16/2016  . Previous cesarean section 09/16/2016  . Hx of incompetent cervix, currently pregnant 09/16/2016  . BMI 40.0-44.9, adult (HCC) 09/16/2016  . HPV test positive 09/16/2016  . 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 Preterm delivery--2015, 22 weeks, iufd 07/08/2015  . Positive GBS test 07/08/2015  . Rubella non-immune status, antepartum 07/08/2015    Past Surgical History:  Procedure Laterality Date  . CESAREAN SECTION N/A 07/09/2015   Procedure: CESAREAN SECTION;  Surgeon: Osborn CohoAngela Roberts, MD;  Location: [facility];  Service: Obstetrics;  Laterality: N/A;  . extraction of wisdom    . WISDOM TOOTH EXTRACTION  2015    OB History    Gravida Para Term  Preterm AB Living   3 2 2  0 1 2   SAB TAB Ectopic Multiple Live Births   1     0 2       Home Medications    Prior to Admission medications   Medication Sig Start Date End Date Taking? Authorizing Provider  Acetaminophen-Guaifenesin (THERAFLU FLU/CHEST CONGESTION PO) Take 30 mLs by mouth every 6 (six) hours as needed (cold symptoms).   Yes [provider]  norethindrone-ethinyl estradiol (OVCON-35,BALZIVA,BRIELLYN) 0.4-35 MG-MCG tablet Take 1 tablet by mouth daily.   Yes [provider]  benzonatate (TESSALON) 100 MG capsule Take 1 capsule (100 mg total) by mouth every 8 (eight) hours. 02/11/18   Darean Rote, [place] N, PA-C  loperamide (IMODIUM) 2 MG capsule Take 1 capsule (2 mg total) by mouth 4 (four) times daily as needed for diarrhea or loose stools. Patient not taking: Reported on 10/04/2017 01/26/17   Linwood DibblesKnapp, Jon, MD  prochlorperazine (COMPAZINE) 10 MG tablet Take 1 tablet (10 mg total) by mouth 2 (two) times daily as needed for nausea or vomiting ((do not take while breast feeding)). Patient not taking: Reported on 10/04/2017 01/26/17   Linwood DibblesKnapp, Jon, MD    Family History Family History  Problem Relation Age of Onset  . Diabetes Paternal Aunt   . Alcohol abuse Maternal Grandfather   . COPD Maternal Grandfather   . Diabetes Paternal Grandmother   . Arthritis Neg Hx   . Asthma Neg Hx   . Birth defects Neg Hx   . Depression Neg Hx   . Drug abuse Neg  Hx   . Early death Neg Hx   . Hearing loss Neg Hx   . Heart disease Neg Hx   . Hyperlipidemia Neg Hx   . Hypertension Neg Hx   . Kidney disease Neg Hx   . Learning disabilities Neg Hx   . Mental illness Neg Hx   . Mental retardation Neg Hx   . Miscarriages / Stillbirths Neg Hx   . Stroke Neg Hx   . Vision loss Neg Hx   . Varicose Veins Neg Hx     Social History Social History   Tobacco Use  . Smoking status: Never Smoker  . Smokeless tobacco: Never Used  Substance Use Topics  . Alcohol use: No  . Drug use:  No     Allergies   Patient has no known allergies.   Review of Systems Review of Systems  Constitutional: Positive for chills and fever (Subjective).  HENT: Positive for congestion and sore throat. Negative for ear pain, trouble swallowing and voice change.   Respiratory: Positive for cough. Negative for shortness of breath.   Cardiovascular: Negative for chest pain.  Gastrointestinal: Negative for abdominal pain.  Musculoskeletal: Positive for myalgias (Generalized).  All other systems reviewed and are negative.    Physical Exam Updated Vital Signs BP 130/70 (BP Location: Left Arm)   Pulse 81   Temp 99 F (37.2 C) (Oral)   Resp 19   LMP 01/29/2018   SpO2 100%   Physical Exam  Constitutional: She appears well-developed and well-nourished. No distress.  Nontoxic-appearing.  Tolerating secretions.  HENT:  Head: Normocephalic and atraumatic.  Right Ear: Tympanic membrane, external ear and ear canal normal.  Left Ear: Tympanic membrane, external ear and ear canal normal.  Pharynx mildly erythematous, without edema or exudates.  Uvula midline, no trismus.  Nose is congested  Eyes: Conjunctivae are normal.  Neck: Normal range of motion. Neck supple.  Cardiovascular: Normal rate, regular rhythm, normal heart sounds and intact distal pulses.  Pulmonary/Chest: Effort normal and breath sounds normal. No stridor. No respiratory distress. She has no wheezes. She has no rales.  Abdominal: Soft. Bowel sounds are normal. She exhibits no distension. There is no tenderness.  Lymphadenopathy:    She has no cervical adenopathy.  Psychiatric: She has a normal mood and affect. Her behavior is normal.  Nursing note and vitals reviewed.    ED Treatments / Results  Labs (all labs ordered are listed, but only abnormal results are displayed) Labs Reviewed - No data to display  EKG  EKG Interpretation None       Radiology No results found.  Procedures Procedures (including  critical care time)  Medications Ordered in ED Medications - No data to display   Initial Impression / Assessment and Plan / ED Course  I have reviewed the triage vital signs and the nursing notes.  Pertinent labs & imaging results that were available during my care of the patient were reviewed by me and considered in my medical decision making (see chart for details).    Patients symptoms are consistent with URI, likely viral etiology. Afebrile, tolerating secretions.  Lungs clear to auscultation bilaterally. Discussed that antibiotics are not indicated for viral infections. Pt will be discharged with symptomatic treatment.  Verbalizes understanding and is agreeable with plan. Pt is hemodynamically stable & in NAD prior to dc.  Discussed results, findings, treatment and follow up. Patient advised of return precautions. Patient verbalized understanding and agreed with plan.  Final Clinical Impressions(s) /  ED Diagnoses   Final diagnoses:  Viral URI with cough    ED Discharge Orders        Ordered    benzonatate (TESSALON) 100 MG capsule  Every 8 hours     02/11/18 1550       Shakema Surita, [place] N, [place] 02/11/18 1550    Terrilee Files, MD 02/13/18 1241

## 2018-07-23 ENCOUNTER — Other Ambulatory Visit: Payer: Self-pay

## 2018-07-23 ENCOUNTER — Encounter (HOSPITAL_COMMUNITY): Payer: Self-pay | Admitting: Emergency Medicine

## 2018-07-23 ENCOUNTER — Emergency Department (HOSPITAL_COMMUNITY)
Admission: EM | Admit: 2018-07-23 | Discharge: 2018-07-23 | Disposition: A | Discharge status: Home / Self Care | Payer: Self-pay | Attending: Emergency Medicine | Admitting: Emergency Medicine

## 2018-07-23 DIAGNOSIS — Z79899 Other long term (current) drug therapy: Secondary | ICD-10-CM | POA: Insufficient documentation

## 2018-07-23 DIAGNOSIS — A084 Viral intestinal infection, unspecified: Secondary | ICD-10-CM | POA: Insufficient documentation

## 2018-07-23 LAB — COMPREHENSIVE METABOLIC PANEL
ALT: 20 U/L (ref 0–44)
AST: 15 U/L (ref 15–41)
Albumin: 3.4 g/dL — ABNORMAL LOW (ref 3.5–5.0)
Alkaline Phosphatase: 47 U/L (ref 38–126)
Anion gap: 3 — ABNORMAL LOW (ref 5–15)
BUN: 10 mg/dL (ref 6–20)
CHLORIDE: 108 mmol/L (ref 98–111)
CO2: 26 mmol/L (ref 22–32)
CREATININE: 0.54 mg/dL (ref 0.44–1.00)
Calcium: 8.5 mg/dL — ABNORMAL LOW (ref 8.9–10.3)
Glucose, Bld: 95 mg/dL (ref 70–99)
POTASSIUM: 3.6 mmol/L (ref 3.5–5.1)
Sodium: 137 mmol/L (ref 135–145)
Total Bilirubin: 0.5 mg/dL (ref 0.3–1.2)
Total Protein: 7.2 g/dL (ref 6.5–8.1)

## 2018-07-23 LAB — URINALYSIS, ROUTINE W REFLEX MICROSCOPIC
Bilirubin Urine: NEGATIVE
Glucose, UA: NEGATIVE mg/dL
Ketones, ur: NEGATIVE mg/dL
LEUKOCYTES UA: NEGATIVE
Nitrite: NEGATIVE
PH: 6 (ref 5.0–8.0)
Protein, ur: 30 mg/dL — AB
SPECIFIC GRAVITY, URINE: 1.018 (ref 1.005–1.030)

## 2018-07-23 LAB — CBC
HEMATOCRIT: 41.2 % (ref 36.0–46.0)
Hemoglobin: 13.9 g/dL (ref 12.0–15.0)
MCH: 32.2 pg (ref 26.0–34.0)
MCHC: 33.7 g/dL (ref 30.0–36.0)
MCV: 95.4 fL (ref 78.0–100.0)
PLATELETS: 243 10*3/uL (ref 150–400)
RBC: 4.32 MIL/uL (ref 3.87–5.11)
RDW: 13.1 % (ref 11.5–15.5)
WBC: 5.5 10*3/uL (ref 4.0–10.5)

## 2018-07-23 LAB — LIPASE, BLOOD: LIPASE: 24 U/L (ref 11–51)

## 2018-07-23 LAB — HCG, QUANTITATIVE, PREGNANCY

## 2018-07-23 MED ORDER — ONDANSETRON HCL 4 MG/2ML IJ SOLN
4.0000 mg | Freq: Once | INTRAMUSCULAR | Status: AC
  Administered 2018-07-23: 4 mg via INTRAVENOUS
  Filled 2018-07-23: qty 2

## 2018-07-23 MED ORDER — ONDANSETRON HCL 4 MG PO TABS: 4.0000 mg | ORAL_TABLET | ORAL | 0 refills | Status: AC | PRN

## 2018-07-23 MED ORDER — SODIUM CHLORIDE 0.9 % IV BOLUS
1000.0000 mL | Freq: Once | INTRAVENOUS | Status: AC
  Administered 2018-07-23: 1000 mL via INTRAVENOUS

## 2018-07-23 NOTE — ED Notes (Signed)
Patient provided with graham crackers and water for PO/fluid challenge. Will monitor.

## 2018-07-23 NOTE — ED Triage Notes (Signed)
Patient complaining of upper abdominal pain. Patient states this started three days ago. Patient is nauseated, vomiting, and having diarrhea.

## 2018-07-23 NOTE — ED Notes (Signed)
Discharge instructions reviewed with patient. Patient verbalizes understanding. VSS.   

## 2018-07-23 NOTE — ED Notes (Signed)
Patient tolerated PO challenge without nausea/vomiting.

## 2018-07-23 NOTE — ED Notes (Signed)
Patient attempted to provide urine specimen but was unable to provide specimen at this time.

## 2018-07-23 NOTE — Discharge Instructions (Signed)
All laboratory results were within normal limits. I have prescribed some nausea medication, please take as needed. Please continue to hydrate with water and Gatorade. If you experience any of the following symptoms: You have chest pain. You feel extremely weak or you faint. You see blood in your vomit. Your vomit looks like coffee grounds. You have bloody or black stools or stools that look like tar. You have a severe headache, a stiff neck, or both. You have a rash. You have severe pain, cramping, or bloating in your abdomen. You have trouble breathing or you are breathing very quickly. Your heart is beating very quickly. Your skin feels cold and clammy. You feel confused. You have pain when you urinate. You have signs of dehydration, such as: Dark urine, very little urine, or no urine. Cracked lips. Dry mouth. Sunken eyes. Sleepiness. Weakness.

## 2018-07-23 NOTE — ED Provider Notes (Signed)
[facility]-EMERGENCY DEPT Provider Note   CSN: [id number] Arrival date & time: 07/23/18  0559     History   Chief Complaint Chief Complaint  Patient presents with  . Abdominal Pain  . Emesis    HPI Tina [name] is a 27 y.o. female.  27 y/o female no past medical history presents to the ED with a chief complaint of abdominal pain, vomiting, diarrhea, nausea x 2 days.  Patient states symptoms began a day after she had [place] food from a store.  Patient reports multiple episodes of vomiting and diarrhea. She denies any hematochezia, or hematemesis. Patient has been able to keep liquids down, but states having difficulty trying to keep solids down.  She did have some rice yesterday but immediately had diarrhea right after. Patient denies any fever, urinary symptoms, gynecological issues, chest pain or shortness of breath.      Past Medical History:  Diagnosis Date  . Preterm labor     Patient Active Problem List   Diagnosis Date Noted  . VBAC, delivered, current hospitalization 10/21/2016  . Second-degree perineal laceration, with delivery 10/21/2016  . Periurethral laceration, delivered, current hospitalization 10/21/2016  . Encounter for trial of labor 10/20/2016  . Decreased fetal movement during pregnancy in third trimester, antepartum 09/16/2016  . Marginal insertion of umbilical cord 09/16/2016  . Previous cesarean section 09/16/2016  . Hx of incompetent cervix, currently pregnant 09/16/2016  . BMI 40.0-44.9, adult (HCC) 09/16/2016  . HPV test positive 09/16/2016  . 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 Preterm delivery--2015, 22 weeks, iufd 07/08/2015  . Positive GBS test 07/08/2015  . Rubella non-immune status, antepartum 07/08/2015    Past Surgical History:  Procedure Laterality Date  . CESAREAN SECTION N/A 07/09/2015   Procedure: CESAREAN SECTION;  Surgeon: Osborn CohoAngela Roberts, MD;  Location: [facility];  Service: Obstetrics;  Laterality: N/A;  .  extraction of wisdom    . WISDOM TOOTH EXTRACTION  2015     OB History    Gravida  3   Para  2   Term  2   Preterm  0   AB  1   Living  2     SAB  1   TAB      Ectopic      Multiple  0   Live Births  2            Home Medications    Prior to Admission medications   Medication Sig Start Date End Date Taking? Authorizing Provider  levonorgestrel-ethinyl estradiol (NORDETTE) 0.15-30 MG-MCG tablet Take 1 tablet by mouth daily.   Yes [provider]  ondansetron (ZOFRAN) 4 MG tablet Take 1 tablet (4 mg total) by mouth as needed for up to 7 days for nausea or vomiting. 07/23/18 07/30/18  Claude MangesSoto, Mateen Franssen, PA-C    Family History Family History  Problem Relation Age of Onset  . Diabetes Paternal Aunt   . Alcohol abuse Maternal Grandfather   . COPD Maternal Grandfather   . Diabetes Paternal Grandmother   . Arthritis Neg Hx   . Asthma Neg Hx   . Birth defects Neg Hx   . Depression Neg Hx   . Drug abuse Neg Hx   . Early death Neg Hx   . Hearing loss Neg Hx   . Heart disease Neg Hx   . Hyperlipidemia Neg Hx   . Hypertension Neg Hx   . Kidney disease Neg Hx   . Learning disabilities Neg Hx   .  Mental illness Neg Hx   . Mental retardation Neg Hx   . Miscarriages / Stillbirths Neg Hx   . Stroke Neg Hx   . Vision loss Neg Hx   . Varicose Veins Neg Hx     Social History Social History   Tobacco Use  . Smoking status: Never Smoker  . Smokeless tobacco: Never Used  Substance Use Topics  . Alcohol use: No  . Drug use: No     Allergies   Patient has no known allergies.   Review of Systems Review of Systems  Constitutional: Negative for chills and fever.  HENT: Negative for ear pain and sore throat.   Eyes: Negative for pain and visual disturbance.  Respiratory: Negative for cough and shortness of breath.   Cardiovascular: Negative for chest pain and palpitations.  Gastrointestinal: Positive for abdominal pain, diarrhea, nausea and vomiting.    Genitourinary: Negative for dysuria, flank pain, hematuria and vaginal bleeding.  Musculoskeletal: Negative for arthralgias and back pain.  Skin: Negative for color change and rash.  Neurological: Negative for seizures and syncope.  All other systems reviewed and are negative.    Physical Exam Updated Vital Signs BP 104/76   Pulse 62   Temp 97.6 F (36.4 C) (Oral)   Resp 18   Ht 5\' 2"  (1.575 m)   Wt 88.9 kg (196 lb)   LMP 07/23/2018   SpO2 100%   BMI 35.85 kg/m   Physical Exam  Constitutional: She is oriented to person, place, and time. She appears well-developed and well-nourished. No distress.  HENT:  Head: Normocephalic and atraumatic.  Mouth/Throat: Oropharynx is clear and moist. No oropharyngeal exudate.  Eyes: Pupils are equal, round, and reactive to light.  Neck: Normal range of motion.  Cardiovascular: Regular rhythm and normal heart sounds.  Pulmonary/Chest: Effort normal and breath sounds normal. No respiratory distress. She has no wheezes.  Abdominal: Soft. Bowel sounds are normal. She exhibits no distension. There is no hepatosplenomegaly. There is generalized tenderness. There is no rigidity, no guarding, no CVA tenderness, no tenderness at McBurney's point and negative Murphy's sign.  Musculoskeletal: She exhibits no tenderness or deformity.       Right lower leg: She exhibits no edema.       Left lower leg: She exhibits no edema.  Neurological: She is alert and oriented to person, place, and time.  Skin: Skin is warm and dry.  Psychiatric: She has a normal mood and affect.  Nursing note and vitals reviewed.    ED Treatments / Results  Labs (all labs ordered are listed, but only abnormal results are displayed) Labs Reviewed  COMPREHENSIVE METABOLIC PANEL - Abnormal; Notable for the following components:      Result Value   Calcium 8.5 (*)    Albumin 3.4 (*)    Anion gap 3 (*)    All other components within normal limits  LIPASE, BLOOD  CBC  HCG,  QUANTITATIVE, PREGNANCY  URINALYSIS, ROUTINE W REFLEX MICROSCOPIC    EKG None  Radiology No results found.  Procedures Procedures (including critical care time)  Medications Ordered in ED Medications  ondansetron (ZOFRAN) injection 4 mg (4 mg Intravenous Given 07/23/18 0707)  sodium chloride 0.9 % bolus 1,000 mL (0 mLs Intravenous Stopped 07/23/18 0824)     Initial Impression / Assessment and Plan / ED Course  I have reviewed the triage vital signs and the nursing notes.  Pertinent labs & imaging results that were available during my care of the  patient were reviewed by me and considered in my medical decision making (see chart for details).     Patient lab results are within normal range.  There is no leukocytosis.  No electrolyte abnormality, there is no elevation of her lipase, AST, ALT.  Levels are within normal limits. Patient states she feels much better after receiving Zofran and fluids.  Patient exhibits no tenderness on right lower quadrant, she is afebrile, I believe this is less likely to be appendicitis.  She is also not tender on left lower quadrant, she has had multiple episodes of diarrhea I believe diverticulitis is less likely seeing as she has no history of prior diverticulosis.  Patient's lipase level is within normal limit I believe this is no gallbladder pathology.  Patient is currently being tried on p.o. challenge, if patient tolerates, she is stable to go home.  Urinalysis not collected at the time of discharge, patient is not exhibiting any flank pain, urinary complaints at this time.  Discussed this patient with Dr. Madilyn Hook, she has seen this patient and agrees with my plan and management.  I will prescribe patient some Zofran to take as needed for nausea, advised patient to keep hydration with Gatorade and fluids.  Return precautions provided for patient.  Final Clinical Impressions(s) / ED Diagnoses   Final diagnoses:  Viral gastroenteritis    ED Discharge  Orders        Ordered    ondansetron (ZOFRAN) 4 MG tablet  As needed     07/23/18 0843       Claude Manges, PA-C 07/23/18 0848    Tilden Fossa, MD 07/28/18 [phone]

## 2018-07-24 NOTE — ED Provider Notes (Signed)
Nursing secretary was contacted by the patient who indicated that she did not receive information on electronically prescribed prescription.  Review of AVS does not reveal a successful E prescription.  I called in a prescription for Zofran No. 20 4 mg to take every 6 hours as needed, no refill, to her pharmacy listed.   Mancel BaleWentz, Jeaneen Cala, MD 07/24/18 [phone]

## 2019-08-05 ENCOUNTER — Inpatient Hospital Stay (HOSPITAL_COMMUNITY)
Admission: AD | Admit: 2019-08-05 | Discharge: 2019-08-05 | Disposition: A | Discharge status: Home / Self Care | Payer: PRIVATE HEALTH INSURANCE | Attending: Obstetrics and Gynecology | Admitting: Obstetrics and Gynecology

## 2019-08-05 ENCOUNTER — Other Ambulatory Visit: Payer: Self-pay

## 2019-08-05 ENCOUNTER — Inpatient Hospital Stay (HOSPITAL_COMMUNITY): Payer: PRIVATE HEALTH INSURANCE

## 2019-08-05 ENCOUNTER — Encounter (HOSPITAL_COMMUNITY): Payer: Self-pay

## 2019-08-05 DIAGNOSIS — Z3A08 8 weeks gestation of pregnancy: Secondary | ICD-10-CM | POA: Diagnosis not present

## 2019-08-05 DIAGNOSIS — B9689 Other specified bacterial agents as the cause of diseases classified elsewhere: Secondary | ICD-10-CM | POA: Diagnosis not present

## 2019-08-05 DIAGNOSIS — N760 Acute vaginitis: Secondary | ICD-10-CM

## 2019-08-05 DIAGNOSIS — O209 Hemorrhage in early pregnancy, unspecified: Secondary | ICD-10-CM | POA: Diagnosis present

## 2019-08-05 DIAGNOSIS — Z3491 Encounter for supervision of normal pregnancy, unspecified, first trimester: Secondary | ICD-10-CM

## 2019-08-05 DIAGNOSIS — O23591 Infection of other part of genital tract in pregnancy, first trimester: Secondary | ICD-10-CM | POA: Diagnosis not present

## 2019-08-05 LAB — URINALYSIS, ROUTINE W REFLEX MICROSCOPIC
Bilirubin Urine: NEGATIVE
Glucose, UA: NEGATIVE mg/dL
Hgb urine dipstick: NEGATIVE
Ketones, ur: NEGATIVE mg/dL
Leukocytes,Ua: NEGATIVE
Nitrite: NEGATIVE
Protein, ur: NEGATIVE mg/dL
Specific Gravity, Urine: 1.016 (ref 1.005–1.030)
pH: 6 (ref 5.0–8.0)

## 2019-08-05 LAB — CBC
HCT: 37 % (ref 36.0–46.0)
Hemoglobin: 12.7 g/dL (ref 12.0–15.0)
MCH: 32.6 pg (ref 26.0–34.0)
MCHC: 34.3 g/dL (ref 30.0–36.0)
MCV: 94.9 fL (ref 80.0–100.0)
Platelets: 238 10*3/uL (ref 150–400)
RBC: 3.9 MIL/uL (ref 3.87–5.11)
RDW: 12.7 % (ref 11.5–15.5)
WBC: 13.5 10*3/uL — ABNORMAL HIGH (ref 4.0–10.5)
nRBC: 0 % (ref 0.0–0.2)

## 2019-08-05 LAB — WET PREP, GENITAL
Sperm: NONE SEEN
Trich, Wet Prep: NONE SEEN
Yeast Wet Prep HPF POC: NONE SEEN

## 2019-08-05 LAB — POCT PREGNANCY, URINE: Preg Test, Ur: POSITIVE — AB

## 2019-08-05 LAB — HCG, QUANTITATIVE, PREGNANCY: hCG, Beta Chain, Quant, S: 174808 m[IU]/mL — ABNORMAL HIGH (ref <5)

## 2019-08-05 MED ORDER — METRONIDAZOLE 500 MG PO TABS: 500.0000 mg | ORAL_TABLET | Freq: Two times a day (BID) | ORAL | 0 refills | Status: DC

## 2019-08-05 NOTE — Discharge Instructions (Signed)
Bacterial Vaginosis  Bacterial vaginosis is a vaginal infection that occurs when the normal balance of bacteria in the vagina is disrupted. It results from an overgrowth of certain bacteria. This is the most common vaginal infection among women ages 71-44. Because bacterial vaginosis increases your risk for STIs (sexually transmitted infections), getting treated can help reduce your risk for chlamydia, gonorrhea, herpes, and HIV (human immunodeficiency virus). Treatment is also important for preventing complications in pregnant women, because this condition can cause an early (premature) delivery. What are the causes? This condition is caused by an increase in harmful bacteria that are normally present in small amounts in the vagina. However, the reason that the condition develops is not fully understood. What increases the risk? The following factors may make you more likely to develop this condition:  Having a new sexual partner or multiple sexual partners.  Having unprotected sex.  Douching.  Having an intrauterine device (IUD).  Smoking.  Drug and alcohol abuse.  Taking certain antibiotic medicines.  Being pregnant. You cannot get bacterial vaginosis from toilet seats, bedding, swimming pools, or contact with objects around you. What are the signs or symptoms? Symptoms of this condition include:  Grey or white vaginal discharge. The discharge can also be watery or foamy.  A fish-like odor with discharge, especially after sexual intercourse or during menstruation.  Itching in and around the vagina.  Burning or pain with urination. Some women with bacterial vaginosis have no signs or symptoms. How is this diagnosed? This condition is diagnosed based on:  Your medical history.  A physical exam of the vagina.  Testing a sample of vaginal fluid under a microscope to look for a large amount of bad bacteria or abnormal cells. Your health care provider may use a cotton swab or  a small wooden spatula to collect the sample. How is this treated? This condition is treated with antibiotics. These may be given as a pill, a vaginal cream, or a medicine that is put into the vagina (suppository). If the condition comes back after treatment, a second round of antibiotics may be needed. Follow these instructions at home: Medicines  Take over-the-counter and prescription medicines only as told by your health care provider.  Take or use your antibiotic as told by your health care provider. Do not stop taking or using the antibiotic even if you start to feel better. General instructions  If you have a female sexual partner, tell her that you have a vaginal infection. She should see her health care provider and be treated if she has symptoms. If you have a female sexual partner, he does not need treatment.  During treatment: ? Avoid sexual activity until you finish treatment. ? Do not douche. ? Avoid alcohol as directed by your health care provider. ? Avoid breastfeeding as directed by your health care provider.  Drink enough water and fluids to keep your urine clear or pale yellow.  Keep the area around your vagina and rectum clean. ? Wash the area daily with warm water. ? Wipe yourself from front to back after using the toilet.  Keep all follow-up visits as told by your health care provider. This is important. How is this prevented?  Do not douche.  Wash the outside of your vagina with warm water only.  Use protection when having sex. This includes latex condoms and dental dams.  Limit how many sexual partners you have. To help prevent bacterial vaginosis, it is best to have sex with just one partner (  monogamous).  Make sure you and your sexual partner are tested for STIs.  Wear cotton or cotton-lined underwear.  Avoid wearing tight pants and pantyhose, especially during summer.  Limit the amount of alcohol that you drink.  Do not use any products that contain  nicotine or tobacco, such as cigarettes and e-cigarettes. If you need help quitting, ask your health care provider.  Do not use illegal drugs. Where to find more information  Centers for Disease Control and Prevention: AppraiserFraud.fi  American Sexual Health Association (ASHA): www.ashastd.org  U.S. Department of Health and Financial controller, Office on Women's Health: DustingSprays.pl or SecuritiesCard.it Contact a health care provider if:  Your symptoms do not improve, even after treatment.  You have more discharge or pain when urinating.  You have a fever.  You have pain in your abdomen.  You have pain during sex.  You have vaginal bleeding between periods. Summary  Bacterial vaginosis is a vaginal infection that occurs when the normal balance of bacteria in the vagina is disrupted.  Because bacterial vaginosis increases your risk for STIs (sexually transmitted infections), getting treated can help reduce your risk for chlamydia, gonorrhea, herpes, and HIV (human immunodeficiency virus). Treatment is also important for preventing complications in pregnant women, because the condition can cause an early (premature) delivery.  This condition is treated with antibiotic medicines. These may be given as a pill, a vaginal cream, or a medicine that is put into the vagina (suppository). This information is not intended to replace advice given to you by your health care provider. Make sure you discuss any questions you have with your health care provider. Document Released: 12/15/2005 Document Revised: 11/27/2017 Document Reviewed: 08/30/2016 Elsevier Patient Education  2020 [place].      Vaginal Bleeding During Pregnancy, First Trimester  A small amount of bleeding from the vagina (spotting) is relatively common during early pregnancy. It usually stops on its own. Various things may cause bleeding or spotting during early pregnancy.  Some bleeding may be related to the pregnancy, and some may not. In many cases, the bleeding is normal and is not a problem. However, bleeding can also be a sign of something serious. Be sure to tell your health care provider about any vaginal bleeding right away. Some possible causes of vaginal bleeding during the first trimester include:  Infection or inflammation of the cervix.  Growths (polyps) on the cervix.  Miscarriage or threatened miscarriage.  Pregnancy tissue developing outside of the uterus (ectopic pregnancy).  A mass of tissue developing in the uterus due to an egg being fertilized incorrectly (molar pregnancy). Follow these instructions at home: Activity  Follow instructions from your health care provider about limiting your activity. Ask what activities are safe for you.  If needed, make plans for someone to help with your regular activities.  Do not have sex or orgasms until your health care provider says that this is safe. General instructions  Take over-the-counter and prescription medicines only as told by your health care provider.  Pay attention to any changes in your symptoms.  Do not use tampons or douche.  Write down how many pads you use each day, how often you change pads, and how soaked (saturated) they are.  If you pass any tissue from your vagina, save the tissue so you can show it to your health care provider.  Keep all follow-up visits as told by your health care provider. This is important. Contact a health care provider if:  You have vaginal  bleeding during any part of your pregnancy.  You have cramps or labor pains.  You have a fever. Get help right away if:  You have severe cramps in your back or abdomen.  You pass large clots or a large amount of tissue from your vagina.  Your bleeding increases.  You feel light-headed or weak, or you faint.  You have chills.  You are leaking fluid or have a gush of fluid from your  vagina. Summary  A small amount of bleeding (spotting) from the vagina is relatively common during early pregnancy.  Various things may cause bleeding or spotting in early pregnancy.  Be sure to tell your health care provider about any vaginal bleeding right away. This information is not intended to replace advice given to you by your health care provider. Make sure you discuss any questions you have with your health care provider. Document Released: 09/24/2005 Document Revised: 04/05/2019 Document Reviewed: 03/19/2017 Elsevier Patient Education  2020 [employer]

## 2019-08-05 NOTE — MAU Provider Note (Signed)
Chief Complaint: Vaginal Bleeding   First Provider Initiated Contact with Patient 08/05/19 1823     SUBJECTIVE HPI: Tina [name] is a 28 y.o. [id number] at 6545w6d by LMP who presents to [facility] reporting vaginal bleeding & abdominal cramping. Reports spotting for the last 3 weeks. Had gush of bright red blood yesterday. Bleeding has slowed down since then. Also reports lower abdominal cramping. Has appointment with [facility] on 8/15.   Location: abdomen Quality: cramping Severity: 6/10 on pain scale Duration: 2 days Timing: intermittent Modifying factors: none Associated signs and symptoms: vaginal bleeding  Past Medical History:  Diagnosis Date  . Preterm labor    OB History  Gravida Para Term Preterm AB Living  4 2 2  0 1 2  SAB TAB Ectopic Multiple Live Births  1     0 2    # Outcome Date GA Lbr Len/2nd Weight Sex Delivery Anes PTL Lv  4 Current           3 Term 10/21/16 9756w2d 12:32 / 00:18 3045 g F VBAC EPI, Local  LIV  2 Term 07/09/15 6156w2d  2610 g M CS-LVertical EPI  LIV  1 SAB 06/2014 7434w0d          Past Surgical History:  Procedure Laterality Date  . CESAREAN SECTION N/A 07/09/2015   Procedure: CESAREAN SECTION;  Surgeon: Osborn CohoAngela Roberts, MD;  Location: [facility];  Service: Obstetrics;  Laterality: N/A;  . extraction of wisdom    . WISDOM TOOTH EXTRACTION  2015   Social History   Socioeconomic History  . Marital status: Single    Spouse name: Not on file  . Number of children: Not on file  . Years of education: Not on file  . Highest education level: Not on file  Occupational History  . Not on file  Social Needs  . Financial resource strain: Not on file  . Food insecurity    Worry: Not on file    Inability: Not on file  . Transportation needs    Medical: Not on file    Non-medical: Not on file  Tobacco Use  . Smoking status: Never Smoker  . Smokeless tobacco: Never Used  Substance and Sexual Activity  . Alcohol use: No  . Drug use: No  . Sexual  activity: Yes  Lifestyle  . Physical activity    Days per week: Not on file    Minutes per session: Not on file  . Stress: Not on file  Relationships  . Social Musicianconnections    Talks on phone: Not on file    Gets together: Not on file    Attends religious service: Not on file    Active member of club or organization: Not on file    Attends meetings of clubs or organizations: Not on file    Relationship status: Not on file  . Intimate partner violence    Fear of current or ex partner: Not on file    Emotionally abused: Not on file    Physically abused: Not on file    Forced sexual activity: Not on file  Other Topics Concern  . Not on file  Social History Narrative  . Not on file   Family History  Problem Relation Age of Onset  . Diabetes Paternal Aunt   . Alcohol abuse Maternal Grandfather   . COPD Maternal Grandfather   . Diabetes Paternal Grandmother   . Arthritis Neg Hx   . Asthma Neg Hx   . Birth  defects Neg Hx   . Depression Neg Hx   . Drug abuse Neg Hx   . Early death Neg Hx   . Hearing loss Neg Hx   . Heart disease Neg Hx   . Hyperlipidemia Neg Hx   . Hypertension Neg Hx   . Kidney disease Neg Hx   . Learning disabilities Neg Hx   . Mental illness Neg Hx   . Mental retardation Neg Hx   . Miscarriages / Stillbirths Neg Hx   . Stroke Neg Hx   . Vision loss Neg Hx   . Varicose Veins Neg Hx    No current facility-administered medications on file prior to encounter.    Current Outpatient Medications on File Prior to Encounter  Medication Sig Dispense Refill  . Prenatal Vit-Fe Fumarate-FA (PRENATAL MULTIVITAMIN) TABS tablet Take 1 tablet by mouth daily at 12 noon.     No Known Allergies  I have reviewed patient's Past Medical Hx, Surgical Hx, Family Hx, Social Hx, medications and allergies.   Review of Systems  Constitutional: Negative.   Gastrointestinal: Positive for abdominal pain.  Genitourinary: Positive for vaginal bleeding.    OBJECTIVE Patient  Vitals for the past 24 hrs:  BP Temp Temp src Pulse Resp Height Weight  08/05/19 2011 (!) 104/54 98.2 F (36.8 C) Oral 71 17 - -  08/05/19 1805 (!) 91/57 - - 77 - - -  08/05/19 1659 127/65 98.5 F (36.9 C) Oral 86 - 5\' 2"  (1.575 m) 90.7 kg   Constitutional: Well-developed, well-nourished female in no acute distress.  Cardiovascular: normal rate & rhythm, no murmur Respiratory: normal rate and effort. Lung sounds clear throughout GI: Abd soft, non-tender, Pos BS x 4. No guarding or rebound tenderness MS: Extremities nontender, no edema, normal ROM Neurologic: Alert and oriented x 4.  GU:     SPECULUM EXAM: NEFG, moderate amount of gray frothy discharge. No blood.   BIMANUAL: No CMT. cervix closed; uterus normal size, no adnexal tenderness or masses.    LAB RESULTS Results for orders placed or performed during the hospital encounter of 08/05/19 (from the past 24 hour(s))  Pregnancy, urine POC     Status: Abnormal   Collection Time: 08/05/19  5:18 PM  Result Value Ref Range   Preg Test, Ur POSITIVE (A) NEGATIVE  Wet prep, genital     Status: Abnormal   Collection Time: 08/05/19  6:41 PM   Specimen: Cervix  Result Value Ref Range   Yeast Wet Prep HPF POC NONE SEEN NONE SEEN   Trich, Wet Prep NONE SEEN NONE SEEN   Clue Cells Wet Prep HPF POC PRESENT (A) NONE SEEN   WBC, Wet Prep HPF POC MANY (A) NONE SEEN   Sperm NONE SEEN   CBC     Status: Abnormal   Collection Time: 08/05/19  6:42 PM  Result Value Ref Range   WBC 13.5 (H) 4.0 - 10.5 K/uL   RBC 3.90 3.87 - 5.11 MIL/uL   Hemoglobin 12.7 12.0 - 15.0 g/dL   HCT 37.0 36.0 - 46.0 %   MCV 94.9 80.0 - 100.0 fL   MCH 32.6 26.0 - 34.0 pg   MCHC 34.3 30.0 - 36.0 g/dL   RDW 12.7 11.5 - 15.5 %   Platelets 238 150 - 400 K/uL   nRBC 0.0 0.0 - 0.2 %  Urinalysis, Routine w reflex microscopic     Status: None   Collection Time: 08/05/19  8:03 PM  Result Value Ref Range  Color, Urine YELLOW YELLOW   APPearance CLEAR CLEAR   Specific  Gravity, Urine 1.016 1.005 - 1.030   pH 6.0 5.0 - 8.0   Glucose, UA NEGATIVE NEGATIVE mg/dL   Hgb urine dipstick NEGATIVE NEGATIVE   Bilirubin Urine NEGATIVE NEGATIVE   Ketones, ur NEGATIVE NEGATIVE mg/dL   Protein, ur NEGATIVE NEGATIVE mg/dL   Nitrite NEGATIVE NEGATIVE   Leukocytes,Ua NEGATIVE NEGATIVE    IMAGING [place] Ob Comp Less 14 Wks  Result Date: 08/05/2019 CLINICAL DATA:  Vaginal bleeding yesterday. Cramping. Pregnant patient. EXAM: OBSTETRIC <14 WK ULTRASOUND TECHNIQUE: Transabdominal ultrasound was performed for evaluation of the gestation as well as the maternal uterus and adnexal regions. COMPARISON:  None. FINDINGS: Intrauterine gestational sac: Single Yolk sac:  Visualized. Embryo:  Visualized. Cardiac Activity: Visualized. Heart Rate: 170 bpm MSD:    mm    w     d CRL:   19.98 mm   8 w 3 d                  US EDC: 03/13/2020 Subchorionic hemorrhage:  None visualized. Maternal uterus/adnexae: Normal in appearance. IMPRESSION: Single live IUP.  No cause for bleeding identified. Electronically Signed   By: Gerome Samavid  Williams III M.D   On: 08/05/2019 19:10    MAU COURSE Orders Placed This Encounter  Procedures  . Wet prep, genital  . US OB Comp Less 14 Wks  . Urinalysis, Routine w reflex microscopic  . CBC  . hCG, quantitative, pregnancy  . Pregnancy, urine POC  . Discharge patient   Meds ordered this encounter  Medications  . metroNIDAZOLE (FLAGYL) 500 MG tablet    Sig: Take 1 tablet (500 mg total) by mouth 2 (two) times daily.    Dispense:  14 tablet    Refill:  0    Order Specific Question:   Supervising Provider    Answer:   Russell BingPICKENS, CHARLIE [[id number]]    MDM +UPT UA, wet prep, GC/chlamydia, CBC, ABO/Rh, quant hCG, and US today to rule out ectopic pregnancy  RH positive. No blood on exam  Ultrasound shows live IUP  Wet prep + clue cells. Will tx for BV.  U/a negative  ASSESSMENT 1. Normal IUP (intrauterine pregnancy) on prenatal ultrasound, first trimester    2. Vaginal bleeding in pregnancy, first trimester   3. BV (bacterial vaginosis)     PLAN Discharge home in stable condition. SAB precautions GC/CT pending  Follow-up Information    [facility].   Specialty: Obstetrics and Gynecology Why: keep scheduled appointment Contact information: [address] [place] [phone]       [facility] Follow up.   Specialty: Obstetrics and Gynecology Why: return for worsening symptoms Contact information: [address] [id number] [place] [id number] [phone]         Allergies as of 08/05/2019   No Known Allergies     Medication List    STOP taking these medications   levonorgestrel-ethinyl estradiol 0.15-30 MG-MCG tablet Commonly known as: NORDETTE     TAKE these medications   metroNIDAZOLE 500 MG tablet Commonly known as: FLAGYL Take 1 tablet (500 mg total) by mouth 2 (two) times daily.   prenatal multivitamin Tabs tablet Take 1 tablet by mouth daily at 12 noon.        Judeth HornLawrence, Selda Jalbert, NP 08/05/2019  8:30 PM

## 2019-08-05 NOTE — MAU Note (Addendum)
Pt c/o gush of blood yesterday at work. Now just sees when she wipes.  Also has cramping. LMP was June 6th.

## 2019-08-09 LAB — GC/CHLAMYDIA PROBE AMP (~~LOC~~) NOT AT ARMC
Chlamydia: NEGATIVE
Neisseria Gonorrhea: NEGATIVE

## 2019-09-23 ENCOUNTER — Other Ambulatory Visit: Payer: Self-pay

## 2019-09-23 ENCOUNTER — Inpatient Hospital Stay (HOSPITAL_COMMUNITY)
Admission: AD | Admit: 2019-09-23 | Discharge: 2019-09-23 | Disposition: A | Discharge status: Home / Self Care | Payer: PRIVATE HEALTH INSURANCE | Attending: Obstetrics and Gynecology | Admitting: Obstetrics and Gynecology

## 2019-09-23 ENCOUNTER — Encounter (HOSPITAL_COMMUNITY): Payer: Self-pay | Admitting: *Deleted

## 2019-09-23 DIAGNOSIS — Z3492 Encounter for supervision of normal pregnancy, unspecified, second trimester: Secondary | ICD-10-CM

## 2019-09-23 DIAGNOSIS — O26892 Other specified pregnancy related conditions, second trimester: Secondary | ICD-10-CM | POA: Diagnosis not present

## 2019-09-23 DIAGNOSIS — Z3A15 15 weeks gestation of pregnancy: Secondary | ICD-10-CM | POA: Insufficient documentation

## 2019-09-23 DIAGNOSIS — M545 Low back pain: Secondary | ICD-10-CM | POA: Insufficient documentation

## 2019-09-23 DIAGNOSIS — R102 Pelvic and perineal pain: Secondary | ICD-10-CM | POA: Insufficient documentation

## 2019-09-23 DIAGNOSIS — O209 Hemorrhage in early pregnancy, unspecified: Secondary | ICD-10-CM | POA: Diagnosis present

## 2019-09-23 LAB — CBC
HCT: 36.3 % (ref 36.0–46.0)
Hemoglobin: 12.5 g/dL (ref 12.0–15.0)
MCH: 32.9 pg (ref 26.0–34.0)
MCHC: 34.4 g/dL (ref 30.0–36.0)
MCV: 95.5 fL (ref 80.0–100.0)
Platelets: 227 10*3/uL (ref 150–400)
RBC: 3.8 MIL/uL — ABNORMAL LOW (ref 3.87–5.11)
RDW: 13 % (ref 11.5–15.5)
WBC: 8.7 10*3/uL (ref 4.0–10.5)
nRBC: 0 % (ref 0.0–0.2)

## 2019-09-23 LAB — URINALYSIS, ROUTINE W REFLEX MICROSCOPIC
Bilirubin Urine: NEGATIVE
Glucose, UA: NEGATIVE mg/dL
Hgb urine dipstick: NEGATIVE
Ketones, ur: NEGATIVE mg/dL
Leukocytes,Ua: NEGATIVE
Nitrite: NEGATIVE
Protein, ur: NEGATIVE mg/dL
Specific Gravity, Urine: 1.024 (ref 1.005–1.030)
pH: 6 (ref 5.0–8.0)

## 2019-09-23 LAB — WET PREP, GENITAL
Clue Cells Wet Prep HPF POC: NONE SEEN
Sperm: NONE SEEN
Trich, Wet Prep: NONE SEEN
Yeast Wet Prep HPF POC: NONE SEEN

## 2019-09-23 MED ORDER — ACETAMINOPHEN 325 MG PO TABS
650.0000 mg | ORAL_TABLET | Freq: Once | ORAL | Status: AC
  Administered 2019-09-23: 650 mg via ORAL
  Filled 2019-09-23: qty 2

## 2019-09-23 NOTE — MAU Provider Note (Signed)
History     CSN: [id number]  Arrival date and time: 09/23/19 7829   First Provider Initiated Contact with Patient 09/23/19 1024      Chief Complaint  Patient presents with  . Vaginal Bleeding   HPI Tina [name] is a 28 y.o. [id number] at [redacted]w[redacted]d who presents to MAU with chief complaint of vaginal bleeding x 1 episode  this morning around 0200. She denies additional bleeding since that time. She was also evaluated for "gush of blood" in MAU on 08/05/19.  Patient also complains of suprapubic cramping and low back back pain at her SI joint, new onset last night. She rates her pain at both sites as 5/10. She denies aggravating or alleviating factors. She has not taken medication or tried other treatments for this complaint.  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 She denies dysuria, flank pain, abdominal tenderness, fever or recent illness. She is remote from sexual intercourse.  She is s/p New OB at [facility] 08/13/19  OB History    Gravida  5   Para  3   Term  2   Preterm  1   AB  1   Living  2     SAB  1   TAB      Ectopic      Multiple  0   Live Births  2           Past Medical History:  Diagnosis Date  . Preterm labor     Past Surgical History:  Procedure Laterality Date  . CESAREAN SECTION N/A 07/09/2015   Procedure: CESAREAN SECTION;  Surgeon: Everett Graff, MD;  Location: [facility];  Service: Obstetrics;  Laterality: N/A;  . extraction of wisdom    . WISDOM TOOTH EXTRACTION  2015    Family History  Problem Relation Age of Onset  . Diabetes Paternal Aunt   . Alcohol abuse Maternal Grandfather   . COPD Maternal Grandfather   . Diabetes Paternal Grandmother   . Arthritis Neg Hx   . Asthma Neg Hx   . Birth defects Neg Hx   . Depression Neg Hx   . Drug abuse Neg Hx   . Early death Neg Hx   . Hearing loss Neg Hx   . Heart disease Neg Hx   . Hyperlipidemia Neg Hx   .  Hypertension Neg Hx   . Kidney disease Neg Hx   . Learning disabilities Neg Hx   . Mental illness Neg Hx   . Mental retardation Neg Hx   . Miscarriages / Stillbirths Neg Hx   . Stroke Neg Hx   . Vision loss Neg Hx   . Varicose Veins Neg Hx     Social History   Tobacco Use  . Smoking status: Never Smoker  . Smokeless tobacco: Never Used  Substance Use Topics  . Alcohol use: No  . Drug use: No    Allergies: No Known Allergies  Medications Prior to Admission  Medication Sig Dispense Refill Last Dose  . Prenatal Vit-Fe Fumarate-FA (PRENATAL MULTIVITAMIN) TABS tablet Take 1 tablet by mouth daily at 12 noon.   09/23/2019 at Unknown time  . metroNIDAZOLE (FLAGYL) 500 MG tablet Take 1 tablet (500 mg total) by mouth 2 (two) times daily. 14 tablet 0     Review of Systems  Gastrointestinal: Positive for abdominal pain.  Genitourinary: Positive for vaginal bleeding.  Musculoskeletal: Positive for  back pain.       Bilateral, SI joint  All other systems reviewed and are negative.  Physical Exam   Temperature 98.4 F (36.9 C), resp. rate 18, weight 101.2 kg, last menstrual period 06/04/2019, unknown if currently breastfeeding.  Physical Exam  Nursing note and vitals reviewed. Constitutional: She is oriented to person, place, and time. She appears well-developed and well-nourished.  Cardiovascular: Normal rate.  Respiratory: Effort normal.  GI: Soft. She exhibits no distension. There is no abdominal tenderness. There is no rebound, no guarding and no CVA tenderness.  Genitourinary:    Vaginal discharge present.     Genitourinary Comments: Scant white discharge in vault, cervix visually closed, no bleeding observed. No evidence of injury.   Neurological: She is alert and oriented to person, place, and time.  Skin: Skin is warm and dry.  Psychiatric: She has a normal mood and affect. Her behavior is normal. Judgment and thought content normal.    MAU Course/MDM  Procedures:  sterile speculum exam  --Pain score unchanged one hour after Tylenol. Coping well with distraction via phone --No abnormal findings on PE or labs collected today  Patient Vitals for the past 24 hrs:  BP Temp Pulse Resp Weight  09/23/19 1218 127/62 - 67 16 -  09/23/19 1009 - 98.4 F (36.9 C) - 18 101.2 kg   Results for orders placed or performed during the hospital encounter of 09/23/19 (from the past 24 hour(s))  Urinalysis, Routine w reflex microscopic     Status: Abnormal   Collection Time: 09/23/19 10:24 AM  Result Value Ref Range   Color, Urine YELLOW YELLOW   APPearance HAZY (A) CLEAR   Specific Gravity, Urine 1.024 1.005 - 1.030   pH 6.0 5.0 - 8.0   Glucose, UA NEGATIVE NEGATIVE mg/dL   Hgb urine dipstick NEGATIVE NEGATIVE   Bilirubin Urine NEGATIVE NEGATIVE   Ketones, ur NEGATIVE NEGATIVE mg/dL   Protein, ur NEGATIVE NEGATIVE mg/dL   Nitrite NEGATIVE NEGATIVE   Leukocytes,Ua NEGATIVE NEGATIVE  Wet prep, genital     Status: Abnormal   Collection Time: 09/23/19 10:32 AM   Specimen: Cervix  Result Value Ref Range   Yeast Wet Prep HPF POC NONE SEEN NONE SEEN   Trich, Wet Prep NONE SEEN NONE SEEN   Clue Cells Wet Prep HPF POC NONE SEEN NONE SEEN   WBC, Wet Prep HPF POC FEW (A) NONE SEEN   Sperm NONE SEEN   CBC     Status: Abnormal   Collection Time: 09/23/19 11:30 AM  Result Value Ref Range   WBC 8.7 4.0 - 10.5 K/uL   RBC 3.80 (L) 3.87 - 5.11 MIL/uL   Hemoglobin 12.5 12.0 - 15.0 g/dL   HCT 78.4 69.6 - 29.5 %   MCV 95.5 80.0 - 100.0 fL   MCH 32.9 26.0 - 34.0 pg   MCHC 34.4 30.0 - 36.0 g/dL   RDW 28.4 13.2 - 44.0 %   Platelets 227 150 - 400 K/uL   nRBC 0.0 0.0 - 0.2 %   Meds ordered this encounter  Medications  . acetaminophen (TYLENOL) tablet 650 mg   Assessment and Plan  --28 y.o. [id number] at [redacted]w[redacted]d  --FHT 144 by Doppler --Vaginal bleeding x 1 episode this morning --Blood type O POS --Discharge home in stable condition  F/U: Next OB appt at [facility]  10/06/19  Calvert Cantor, CNM 09/23/2019, 12:33 PM

## 2019-09-23 NOTE — MAU Note (Signed)
Pt had some red bleeding this morning when she went to urinate.  No bleeding now.  Has lower abdominal cramping since yesterday, rates pain 5/10.  Denies vaginal itching, burning, or discharge.  No recent SI.

## 2019-09-24 LAB — CERVICOVAGINAL ANCILLARY ONLY
Chlamydia: NEGATIVE
Neisseria Gonorrhea: NEGATIVE

## 2019-10-06 LAB — OB RESULTS CONSOLE GBS: GBS: POSITIVE

## 2019-12-20 ENCOUNTER — Encounter (HOSPITAL_COMMUNITY): Payer: Self-pay | Admitting: Obstetrics and Gynecology

## 2019-12-20 ENCOUNTER — Inpatient Hospital Stay (HOSPITAL_COMMUNITY)
Admission: AD | Admit: 2019-12-20 | Discharge: 2019-12-20 | Disposition: A | Discharge status: Home / Self Care | Payer: PRIVATE HEALTH INSURANCE | Attending: Obstetrics and Gynecology | Admitting: Obstetrics and Gynecology

## 2019-12-20 ENCOUNTER — Other Ambulatory Visit: Payer: Self-pay

## 2019-12-20 DIAGNOSIS — O2343 Unspecified infection of urinary tract in pregnancy, third trimester: Secondary | ICD-10-CM | POA: Insufficient documentation

## 2019-12-20 DIAGNOSIS — Z3A28 28 weeks gestation of pregnancy: Secondary | ICD-10-CM | POA: Diagnosis not present

## 2019-12-20 DIAGNOSIS — R1032 Left lower quadrant pain: Secondary | ICD-10-CM | POA: Diagnosis not present

## 2019-12-20 DIAGNOSIS — O26893 Other specified pregnancy related conditions, third trimester: Secondary | ICD-10-CM | POA: Insufficient documentation

## 2019-12-20 LAB — URINALYSIS, ROUTINE W REFLEX MICROSCOPIC
Bilirubin Urine: NEGATIVE
Glucose, UA: NEGATIVE mg/dL
Ketones, ur: NEGATIVE mg/dL
Nitrite: NEGATIVE
Protein, ur: 30 mg/dL — AB
RBC / HPF: >50 RBC/hpf — ABNORMAL HIGH (ref 0–5)
Specific Gravity, Urine: 1.018 (ref 1.005–1.030)
WBC, UA: >50 WBC/hpf — ABNORMAL HIGH (ref 0–5)
pH: 7 (ref 5.0–8.0)

## 2019-12-20 LAB — WET PREP, GENITAL
Clue Cells Wet Prep HPF POC: NONE SEEN
Sperm: NONE SEEN
Trich, Wet Prep: NONE SEEN
Yeast Wet Prep HPF POC: NONE SEEN

## 2019-12-20 MED ORDER — ACETAMINOPHEN 500 MG PO TABS
1000.0000 mg | ORAL_TABLET | Freq: Four times a day (QID) | ORAL | Status: DC | PRN
  Administered 2019-12-20: 1000 mg via ORAL
  Filled 2019-12-20: qty 2

## 2019-12-20 MED ORDER — NITROFURANTOIN MONOHYD MACRO 100 MG PO CAPS: 100.0000 mg | ORAL_CAPSULE | Freq: Two times a day (BID) | ORAL | 0 refills | Status: DC

## 2019-12-20 NOTE — Discharge Instructions (Signed)
Fetal Movement Counts Patient Name: ________________________________________________ Patient Due Date: ____________________ What is a fetal movement count?  A fetal movement count is the number of times that you feel your baby move during a certain amount of time. This may also be called a fetal kick count. A fetal movement count is recommended for every pregnant woman. You may be asked to start counting fetal movements as early as week 28 of your pregnancy. Pay attention to when your baby is most active. You may notice your baby's sleep and wake cycles. You may also notice things that make your baby move more. You should do a fetal movement count:  When your baby is normally most active.  At the same time each day. A good time to count movements is while you are resting, after having something to eat and drink. How do I count fetal movements? 1. Find a quiet, comfortable area. Sit, or lie down on your side. 2. Write down the date, the start time and stop time, and the number of movements that you felt between those two times. Take this information with you to your health care visits. 3. For 2 hours, count kicks, flutters, swishes, rolls, and jabs. You should feel at least 10 movements during 2 hours. 4. You may stop counting after you have felt 10 movements. 5. If you do not feel 10 movements in 2 hours, have something to eat and drink. Then, keep resting and counting for 1 hour. If you feel at least 4 movements during that hour, you may stop counting. Contact a health care provider if:  You feel fewer than 4 movements in 2 hours.  Your baby is not moving like he or she usually does. Date: ____________ Start time: ____________ Stop time: ____________ Movements: ____________ Date: ____________ Start time: ____________ Stop time: ____________ Movements: ____________ Date: ____________ Start time: ____________ Stop time: ____________ Movements: ____________ Date: ____________ Start time:  ____________ Stop time: ____________ Movements: ____________ Date: ____________ Start time: ____________ Stop time: ____________ Movements: ____________ Date: ____________ Start time: ____________ Stop time: ____________ Movements: ____________ Date: ____________ Start time: ____________ Stop time: ____________ Movements: ____________ Date: ____________ Start time: ____________ Stop time: ____________ Movements: ____________ Date: ____________ Start time: ____________ Stop time: ____________ Movements: ____________ This information is not intended to replace advice given to you by your health care provider. Make sure you discuss any questions you have with your health care provider. Document Released: 01/14/2007 Document Revised: 01/04/2019 Document Reviewed: 01/24/2016 Elsevier Patient Education  2020 [employer] Pregnancy and Urinary Tract Infection  A urinary tract infection (UTI) is an infection of any part of the urinary tract. This includes the kidneys, the tubes that connect your kidneys to your bladder (ureters), the bladder, and the tube that carries urine out of your body (urethra). These organs make, store, and get rid of urine in the body. Your health care provider may use other names to describe the infection. An upper UTI affects the ureters and kidneys (pyelonephritis). A lower UTI affects the bladder (cystitis) and urethra (urethritis). Most urinary tract infections are caused by bacteria in your genital area, around the entrance to your urinary tract (urethra). These bacteria grow and cause irritation and inflammation of your urinary tract. You are more likely to develop a UTI during pregnancy because the physical and hormonal changes your body goes through can make it easier for bacteria to get into your urinary tract. Your growing baby also puts pressure on your bladder and can affect urine flow. It  is important to recognize and treat UTIs in pregnancy because of the risk of serious  complications for both you and your baby. How does this affect me? Symptoms of a UTI include:  Needing to urinate right away (urgently).  Frequent urination or passing small amounts of urine frequently.  Pain or burning with urination.  Blood in the urine.  Urine that smells bad or unusual.  Trouble urinating.  Cloudy urine.  Pain in the abdomen or lower back.  Vaginal discharge. You may also have:  Vomiting or a decreased appetite.  Confusion.  Irritability or tiredness.  A fever.  Diarrhea. How does this affect my baby? An untreated UTI during pregnancy could lead to a kidney infection or a systemic infection, which can cause health problems that could affect your baby. Possible complications of an untreated UTI include:  Giving birth to your baby before 37 weeks of pregnancy (premature).  Having a baby with a low birth weight.  Developing high blood pressure during pregnancy (preeclampsia).  Having a low hemoglobin level (anemia). What can I do to lower my risk? To prevent a UTI:  Go to the bathroom as soon as you feel the need. Do not hold urine for long periods of time.  Always wipe from front to back, especially after a bowel movement. Use each tissue one time when you wipe.  Empty your bladder after sex.  Keep your genital area dry.  Drink 6-10 glasses of water each day.  Do not douche or use deodorant sprays. How is this treated? Treatment for this condition may include:  Antibiotic medicines that are safe to take during pregnancy.  Other medicines to treat less common causes of UTI. Follow these instructions at home:  If you were prescribed an antibiotic medicine, take it as told by your health care provider. Do not stop using the antibiotic even if you start to feel better.  Keep all follow-up visits as told by your health care provider. This is important. Contact a health care provider if:  Your symptoms do not improve or they get  worse.  You have abnormal vaginal discharge. Get help right away if you:  Have a fever.  Have nausea and vomiting.  Have back or side pain.  Feel contractions in your uterus.  Have lower belly pain.  Have a gush of fluid from your vagina.  Have blood in your urine. Summary  A urinary tract infection (UTI) is an infection of any part of the urinary tract, which includes the kidneys, ureters, bladder, and urethra.  Most urinary tract infections are caused by bacteria in your genital area, around the entrance to your urinary tract (urethra).  You are more likely to develop a UTI during pregnancy.  If you were prescribed an antibiotic medicine, take it as told by your health care provider. Do not stop using the antibiotic even if you start to feel better. This information is not intended to replace advice given to you by your health care provider. Make sure you discuss any questions you have with your health care provider. Document Released: 04/11/2011 Document Revised: 04/08/2019 Document Reviewed: 11/18/2018 Elsevier Patient Education  2020 [employer].

## 2019-12-20 NOTE — MAU Provider Note (Addendum)
History     CSN: [id number]  Arrival date and time: 12/20/19 1740   First Provider Initiated Contact with Patient 12/20/19 0746      Chief Complaint  Patient presents with  . Vaginal Bleeding  . Abdominal Pain   28 y.o. [id number] @28 .3 wks presenting with pelvic pressure and LLQ pain. She also saw blood when wiping this am. Pelvic pressure started a few days ago. LLQ pain started today. Rates pain 6/10. Has not taken anything for it. Denies urinary sx. No vaginal discharge or LOF. Has occasional ctx. No recent IC. +FM.   OB History    Gravida  5   Para  3   Term  2   Preterm  1   AB  1   Living  2     SAB  1   TAB      Ectopic      Multiple  0   Live Births  2           Past Medical History:  Diagnosis Date  . Preterm labor     Past Surgical History:  Procedure Laterality Date  . CESAREAN SECTION N/A 07/09/2015   Procedure: CESAREAN SECTION;  Surgeon: Everett Graff, MD;  Location: [facility];  Service: Obstetrics;  Laterality: N/A;  . WISDOM TOOTH EXTRACTION  2015    Family History  Problem Relation Age of Onset  . Diabetes Paternal Aunt   . Alcohol abuse Maternal Grandfather   . COPD Maternal Grandfather   . Diabetes Paternal Grandmother   . Arthritis Neg Hx   . Asthma Neg Hx   . Birth defects Neg Hx   . Depression Neg Hx   . Drug abuse Neg Hx   . Early death Neg Hx   . Hearing loss Neg Hx   . Heart disease Neg Hx   . Hyperlipidemia Neg Hx   . Hypertension Neg Hx   . Kidney disease Neg Hx   . Learning disabilities Neg Hx   . Mental illness Neg Hx   . Mental retardation Neg Hx   . Miscarriages / Stillbirths Neg Hx   . Stroke Neg Hx   . Vision loss Neg Hx   . Varicose Veins Neg Hx     Social History   Tobacco Use  . Smoking status: Never Smoker  . Smokeless tobacco: Never Used  Substance Use Topics  . Alcohol use: No  . Drug use: No    Allergies: No Known Allergies  Medications Prior to Admission  Medication Sig Dispense  Refill Last Dose  . Prenatal Vit-Fe Fumarate-FA (PRENATAL MULTIVITAMIN) TABS tablet Take 1 tablet by mouth daily at 12 noon.   12/20/2019 at Unknown time    Review of Systems  Gastrointestinal: Positive for abdominal pain. Negative for constipation, diarrhea, nausea and vomiting.  Genitourinary: Positive for pelvic pain. Negative for dysuria, frequency, hematuria, vaginal bleeding and vaginal discharge.  Musculoskeletal: Positive for back pain (left, lower).   Physical Exam   Blood pressure 114/62, pulse 79, temperature 98.3 F (36.8 C), resp. rate 16, height 5\' 2"  (1.575 m), weight 108.4 kg, last menstrual period 06/04/2019, unknown if currently breastfeeding.  Physical Exam  Nursing note and vitals reviewed. Constitutional: She is oriented to person, place, and time. She appears well-developed and well-nourished. No distress.  HENT:  Head: Normocephalic and atraumatic.  Cardiovascular: Normal rate.  Respiratory: Effort normal. No respiratory distress.  GI: Soft. She exhibits no distension. There is no abdominal tenderness.  gravid  Genitourinary:    Genitourinary Comments: External: no lesions or erythema Vagina: rugated, pink, moist, thick clumpy white discharge, no blood Cervix closed, long    Musculoskeletal:        General: Normal range of motion.     Cervical back: Normal range of motion.  Neurological: She is alert and oriented to person, place, and time.  Skin: Skin is warm and dry.  Psychiatric: She has a normal mood and affect.  EFM: 135 bpm, mod variability, + accels, no decels Toco: none  Results for orders placed or performed during the hospital encounter of 12/20/19 (from the past 24 hour(s))  Wet prep, genital     Status: Abnormal   Collection Time: 12/20/19  7:55 AM   Specimen: PATH Cytology Cervicovaginal Ancillary Only  Result Value Ref Range   Yeast Wet Prep HPF POC NONE SEEN NONE SEEN   Trich, Wet Prep NONE SEEN NONE SEEN   Clue Cells Wet Prep HPF POC  NONE SEEN NONE SEEN   WBC, Wet Prep HPF POC MODERATE (A) NONE SEEN   Sperm NONE SEEN   Urinalysis, Routine w reflex microscopic     Status: Abnormal   Collection Time: 12/20/19  7:56 AM  Result Value Ref Range   Color, Urine YELLOW YELLOW   APPearance CLOUDY (A) CLEAR   Specific Gravity, Urine 1.018 1.005 - 1.030   pH 7.0 5.0 - 8.0   Glucose, UA NEGATIVE NEGATIVE mg/dL   Hgb urine dipstick MODERATE (A) NEGATIVE   Bilirubin Urine NEGATIVE NEGATIVE   Ketones, ur NEGATIVE NEGATIVE mg/dL   Protein, ur 30 (A) NEGATIVE mg/dL   Nitrite NEGATIVE NEGATIVE   Leukocytes,Ua LARGE (A) NEGATIVE   RBC / HPF >50 (H) 0 - 5 RBC/hpf   WBC, UA >50 (H) 0 - 5 WBC/hpf   Bacteria, UA MANY (A) NONE SEEN   Squamous Epithelial / LPF 6-10 0 - 5   WBC Clumps PRESENT    Mucus PRESENT     MAU Course  Procedures Meds ordered this encounter  Medications  . acetaminophen (TYLENOL) tablet 1,000 mg   MDM Labs ordered. Transfer of care given to [facility], [place].  Donette Larry, CNM  12/20/2019 8:02 AM    Wet prep negative Cervix closed/long No blood on exam RH positive No regular contractions  U/a concerning for UTI. Will treat & send urine for culture Afebrile & no CVAT  Assessment and Plan  A: 1. Urinary tract infection in mother during third trimester of pregnancy   2. [redacted] weeks gestation of pregnancy     P: Discharge home in stable condition Rx macrobid Urine culture pending Discussed reasons to return to MAU F/u with ob as scheduled  Judeth Horn, NP

## 2019-12-20 NOTE — MAU Note (Signed)
.   Tina [name] is a 28 y.o. at [redacted]w[redacted]d here in MAU reporting: lower abdominal pressure for a couple of days. Noticed scant amount of spotting when she wiped this morning  Onset of complaint: 2 days  Pain score: 6 Vitals:   12/20/19 0719 12/20/19 0721  BP:  133/75  Pulse:  (!) 112  Resp: 16   Temp: 98.3 F (36.8 C)      FHT:135 Lab orders placed from triage:UA

## 2019-12-21 LAB — GC/CHLAMYDIA PROBE AMP (~~LOC~~) NOT AT ARMC
Chlamydia: NEGATIVE
Comment: NEGATIVE
Comment: NORMAL
Neisseria Gonorrhea: NEGATIVE

## 2019-12-23 LAB — CULTURE, OB URINE: Culture: >=100000 — AB

## 2019-12-30 NOTE — L&D Delivery Note (Signed)
Delivery Note Epidural placed at 1730, rapid progression to complete and pushing, epidural minimal effect. At 7:31 PM a viable female was delivered via Vaginal, Spontaneous (Presentation:  Cephalic, OA to ROT).  APGAR: 8, 9; weight  pending.   Placenta status: Spontaneous, Intact.  Cord: 3VC   with the following complications: none .  Cord blood collected for typing.  Anesthesia: Epidural Episiotomy: None Lacerations: None  Est. Blood Loss (mL):  200 GBS prophylaxis x 1 dose  Mom to postpartum.  Baby to Couplet care / Skin to Skin.  Neta Mends, CNM 03/02/2020, 7:51 PM

## 2020-01-25 ENCOUNTER — Encounter (HOSPITAL_COMMUNITY): Payer: Self-pay | Admitting: Obstetrics and Gynecology

## 2020-01-25 ENCOUNTER — Other Ambulatory Visit: Payer: Self-pay | Admitting: Obstetrics and Gynecology

## 2020-01-25 ENCOUNTER — Inpatient Hospital Stay (HOSPITAL_COMMUNITY)
Admission: AD | Admit: 2020-01-25 | Discharge: 2020-01-25 | Disposition: A | Discharge status: Home / Self Care | Payer: PRIVATE HEALTH INSURANCE | Attending: Obstetrics and Gynecology | Admitting: Obstetrics and Gynecology

## 2020-01-25 ENCOUNTER — Other Ambulatory Visit: Payer: Self-pay

## 2020-01-25 DIAGNOSIS — R102 Pelvic and perineal pain: Secondary | ICD-10-CM | POA: Insufficient documentation

## 2020-01-25 DIAGNOSIS — N949 Unspecified condition associated with female genital organs and menstrual cycle: Secondary | ICD-10-CM

## 2020-01-25 DIAGNOSIS — O368130 Decreased fetal movements, third trimester, not applicable or unspecified: Secondary | ICD-10-CM | POA: Insufficient documentation

## 2020-01-25 DIAGNOSIS — O26893 Other specified pregnancy related conditions, third trimester: Secondary | ICD-10-CM

## 2020-01-25 DIAGNOSIS — Z3A33 33 weeks gestation of pregnancy: Secondary | ICD-10-CM | POA: Diagnosis not present

## 2020-01-25 DIAGNOSIS — O4703 False labor before 37 completed weeks of gestation, third trimester: Secondary | ICD-10-CM | POA: Diagnosis not present

## 2020-01-25 DIAGNOSIS — R109 Unspecified abdominal pain: Secondary | ICD-10-CM

## 2020-01-25 DIAGNOSIS — O26899 Other specified pregnancy related conditions, unspecified trimester: Secondary | ICD-10-CM

## 2020-01-25 LAB — URINALYSIS, ROUTINE W REFLEX MICROSCOPIC
Bilirubin Urine: NEGATIVE
Glucose, UA: NEGATIVE mg/dL
Ketones, ur: NEGATIVE mg/dL
Leukocytes,Ua: NEGATIVE
Nitrite: NEGATIVE
Protein, ur: NEGATIVE mg/dL
Specific Gravity, Urine: 1.017 (ref 1.005–1.030)
pH: 7 (ref 5.0–8.0)

## 2020-01-25 LAB — FETAL FIBRONECTIN: Fetal Fibronectin: NEGATIVE

## 2020-01-25 MED ORDER — ACETAMINOPHEN 500 MG PO TABS
1000.0000 mg | ORAL_TABLET | Freq: Once | ORAL | Status: AC
  Administered 2020-01-25: 09:00:00 1000 mg via ORAL
  Filled 2020-01-25: qty 2

## 2020-01-25 MED ORDER — NIFEDIPINE 10 MG PO CAPS
10.0000 mg | ORAL_CAPSULE | Freq: Once | ORAL | Status: AC
  Administered 2020-01-25: 10 mg via ORAL
  Filled 2020-01-25: qty 1

## 2020-01-25 NOTE — MAU Provider Note (Signed)
History     CSN: [id number]  Arrival date and time: 01/25/20 9702   First Provider Initiated Contact with Patient 01/25/20 0855      No chief complaint on file.  HPI   [name] is a 29 y.o. female [id number] @[redacted]w[redacted]d  with a history of IUFD here with contractions. States yesterday her contractions were strong. Today they are much less painful however she feels pressure in her lower abdomen. She was recently treated for a UTI and feel's she improved following treatment. She denies urinary complaints. The pressure is in her lower pelvis. The pressure comes and goes. She has not taken anything for the pressure. No bleeding, + fetal movement.   OB History    Gravida  5   Para  3   Term  2   Preterm  1   AB  1   Living  2     SAB  1   TAB      Ectopic      Multiple  0   Live Births  2           Past Medical History:  Diagnosis Date  . Preterm labor     Past Surgical History:  Procedure Laterality Date  . CESAREAN SECTION N/A 07/09/2015   Procedure: CESAREAN SECTION;  Surgeon: Everett Graff, MD;  Location: [facility];  Service: Obstetrics;  Laterality: N/A;  . WISDOM TOOTH EXTRACTION  2015    Family History  Problem Relation Age of Onset  . Diabetes Paternal Aunt   . Alcohol abuse Maternal Grandfather   . COPD Maternal Grandfather   . Diabetes Paternal Grandmother   . Arthritis Neg Hx   . Asthma Neg Hx   . Birth defects Neg Hx   . Depression Neg Hx   . Drug abuse Neg Hx   . Early death Neg Hx   . Hearing loss Neg Hx   . Heart disease Neg Hx   . Hyperlipidemia Neg Hx   . Hypertension Neg Hx   . Kidney disease Neg Hx   . Learning disabilities Neg Hx   . Mental illness Neg Hx   . Mental retardation Neg Hx   . Miscarriages / Stillbirths Neg Hx   . Stroke Neg Hx   . Vision loss Neg Hx   . Varicose Veins Neg Hx     Social History   Tobacco Use  . Smoking status: Never Smoker  . Smokeless tobacco: Never Used  Substance Use Topics  . Alcohol  use: No  . Drug use: No    Allergies: No Known Allergies  Medications Prior to Admission  Medication Sig Dispense Refill Last Dose  . acetaminophen (TYLENOL) 500 MG tablet Take 500 mg by mouth every 6 (six) hours as needed.   01/24/2020 at 1900  . Prenatal Vit-Fe Fumarate-FA (PRENATAL MULTIVITAMIN) TABS tablet Take 1 tablet by mouth daily at 12 noon.   01/24/2020 at 0700  . nitrofurantoin, macrocrystal-monohydrate, (MACROBID) 100 MG capsule Take 1 capsule (100 mg total) by mouth 2 (two) times daily. 14 capsule 0    Results for orders placed or performed during the hospital encounter of 01/25/20 (from the past 48 hour(s))  Urinalysis, Routine w reflex microscopic     Status: Abnormal   Collection Time: 01/25/20  8:22 AM  Result Value Ref Range   Color, Urine YELLOW YELLOW   APPearance HAZY (A) CLEAR   Specific Gravity, Urine 1.017 1.005 - 1.030   pH 7.0 5.0 - 8.0  Glucose, UA NEGATIVE NEGATIVE mg/dL   Hgb urine dipstick SMALL (A) NEGATIVE   Bilirubin Urine NEGATIVE NEGATIVE   Ketones, ur NEGATIVE NEGATIVE mg/dL   Protein, ur NEGATIVE NEGATIVE mg/dL   Nitrite NEGATIVE NEGATIVE   Leukocytes,Ua NEGATIVE NEGATIVE   RBC / HPF 21-50 0 - 5 RBC/hpf   WBC, UA 0-5 0 - 5 WBC/hpf   Bacteria, UA FEW (A) NONE SEEN   Squamous Epithelial / LPF 6-10 0 - 5   Mucus PRESENT     Comment: Performed at [facility], [address] [place], [place] [address]  Fetal fibronectin     Status: None   Collection Time: 01/25/20  9:33 AM  Result Value Ref Range   Fetal Fibronectin NEGATIVE NEGATIVE    Comment: Performed at [facility], [address] [place], [place] [address]   Review of Systems  Gastrointestinal: Positive for abdominal pain.  Genitourinary: Positive for pelvic pain. Negative for dysuria.   Physical Exam   Blood pressure 127/71, pulse 89, temperature 97.6 F (36.4 C), temperature source Oral, resp. rate 20, height 5\' 2"  (1.575 m), weight 111.1 kg, last menstrual period  06/04/2019, SpO2 100 %, unknown if currently breastfeeding.  Physical Exam  Constitutional: She is oriented to person, place, and time. She appears well-developed and well-nourished. No distress.  GI: Soft. Normal appearance. There is abdominal tenderness in the suprapubic area. There is no rigidity, no rebound and no guarding.  Genitourinary:    Genitourinary Comments: Dilation: Fingertip Exam by:: [email or web address], NP   Musculoskeletal:        General: Normal range of motion.  Neurological: She is alert and oriented to person, place, and time.  Skin: Skin is warm. She is not diaphoretic.  Psychiatric: Her behavior is normal.   Fetal Tracing: Baseline: 140 bpm Variability: Moderate  Accelerations: 15x15 Decelerations: None Toco: None  MAU Course  Procedures  None  MDM  Tylenol given 1 gram Procardia 1 dose FFN negative today. UA and urine culture sent.   Assessment and Plan   A:  1. Abdominal pain during pregnancy, antepartum   2. Round ligament pain   3. [redacted] weeks gestation of pregnancy     P:  Discharge home in stable condition Warm baths ok, tylenol as directed on the bottle as needed F/u in the office as scheduled Return to MAU if symptoms worsen  Pregnancy belt recommended.  Urine culture pending   Blanche East, NP 01/25/2020 7:22 PM

## 2020-01-25 NOTE — MAU Note (Signed)
Tina [name] is a 29 y.o. at [redacted]w[redacted]d here in MAU reporting: was having contractions yesterday and they were strong. States today she has had 1 contraction but is feeling sharp pains in her pelvic every 10 minutes. No bleeding. No LOF. Decreased fetal movement, states no movement since yesterday afternoon. States her abdomen just feels weird and is unsure if she needs an ultrasound.   Onset of complaint: yesterday  Pain score: 6/10  Vitals:   01/25/20 0804  BP: (!) 129/53  Pulse: 96  Resp: 18  Temp: 98.5 F (36.9 C)  SpO2: 100%     FHT: 150  Lab orders placed from triage: UA

## 2020-01-25 NOTE — Discharge Instructions (Signed)
Abdominal Pain During Pregnancy  Belly (abdominal) pain is common during pregnancy. There are many possible causes. Most of the time, it is not a serious problem. Other times, it can be a sign that something is wrong with the pregnancy. Always tell your doctor if you have belly pain. Follow these instructions at home:  Do not have sex or put anything in your vagina until your pain goes away completely.  Get plenty of rest until your pain gets better.  Drink enough fluid to keep your pee (urine) pale yellow.  Take over-the-counter and prescription medicines only as told by your doctor.  Keep all follow-up visits as told by your doctor. This is important. Contact a doctor if:  Your pain continues or gets worse after resting.  You have lower belly pain that: ? Comes and goes at regular times. ? Spreads to your back. ? Feels like menstrual cramps.  You have pain or burning when you pee (urinate). Get help right away if:  You have a fever or chills.  You have vaginal bleeding.  You are leaking fluid from your vagina.  You are passing tissue from your vagina.  You throw up (vomit) for more than 24 hours.  You have watery poop (diarrhea) for more than 24 hours.  Your baby is moving less than usual.  You feel very weak or faint.  You have shortness of breath.  You have very bad pain in your upper belly. Summary  Belly (abdominal) pain is common during pregnancy. There are many possible causes.  If you have belly pain during pregnancy, tell your doctor right away.  Keep all follow-up visits as told by your doctor. This is important. This information is not intended to replace advice given to you by your health care provider. Make sure you discuss any questions you have with your health care provider. Document Revised: 04/04/2019 Document Reviewed: 03/19/2017 Elsevier Patient Education  2020 Elsevier Inc.   Round Ligament Pain During Pregnancy   Round ligament pain  is a sharp pain or jabbing feeling often felt in the lower belly or groin area on one or both sides. It is one of the most common complaints during pregnancy and is considered a normal part of pregnancy. It is most often felt during the second trimester.   Here is what you need to know about round ligament pain, including some tips to help you feel better.   Causes of Round Ligament Pain:    Several thick ligaments surround and support your womb (uterus) as it grows during pregnancy. One of them is called the round ligament.   The round ligament connects the front part of the womb to your groin, the area where your legs attach to your pelvis. The round ligament normally tightens and relaxes slowly.   As your baby and womb grow, the round ligament stretches. That makes it more likely to become strained.   Sudden movements can cause the ligament to tighten quickly, like a rubber band snapping. This causes a sudden and quick jabbing feeling.   Symptoms of Round Ligament Pain   Round ligament pain can be concerning and uncomfortable. But it is considered normal as your body changes during pregnancy.   The symptoms of round ligament pain include a sharp, sudden spasm in the belly. It usually affects the right side, but it may happen on both sides. The pain only lasts a few seconds.   Exercise may cause the pain, as will rapid movements such as:  sneezing  coughing  laughing  rolling over in bed  standing up too quickly   Treatment of Round Ligament Pain   Here are some tips that may help reduce your discomfort:   Pain relief. Take over-the-counter acetaminophen for pain, if necessary. Ask your doctor if this is OK.   Exercise. Get plenty of exercise to keep your stomach (core) muscles strong. Doing stretching exercises or prenatal yoga can be helpful. Ask your doctor which exercises are safe for you and your baby.   A helpful exercise involves putting your hands and knees on the  floor, lowering your head, and pushing your backside into the air.   Avoid sudden movements. Change positions slowly (such as standing up or sitting down) to avoid sudden movements that may cause stretching and pain.   Flex your hips. Bend and flex your hips before you cough, sneeze, or laugh to avoid pulling on the ligaments.   Apply warmth. A heating pad or warm bath may be helpful. Ask your doctor if this is OK. Extreme heat can be dangerous to the baby.   You should try to modify your daily activity level and avoid positions that may worsen the condition.   When to Call the Doctor/Midwife   Always tell your doctor or midwife about any type of pain you have during pregnancy. Round ligament pain is quick and doesn't last long.   Call your health care provider immediately if you have:   severe pain  fever  chills  pain on urination  difficulty walking   Belly pain during pregnancy can be due to many different causes. It is important for your doctor to rule out more serious conditions, including pregnancy complications such as placenta abruption or non-pregnancy illnesses such as:   inguinal hernia  appendicitis  stomach, liver, and kidney problems  Preterm labor pains may sometimes be mistaken for round ligament pain.

## 2020-01-26 LAB — CULTURE, OB URINE: Culture: <10000 — AB

## 2020-02-27 ENCOUNTER — Encounter (HOSPITAL_COMMUNITY): Payer: Self-pay | Admitting: *Deleted

## 2020-02-27 ENCOUNTER — Telehealth (HOSPITAL_COMMUNITY): Payer: Self-pay | Admitting: *Deleted

## 2020-02-27 NOTE — Telephone Encounter (Signed)
Preadmission screen  

## 2020-03-02 ENCOUNTER — Inpatient Hospital Stay (HOSPITAL_COMMUNITY): Payer: PRIVATE HEALTH INSURANCE | Admitting: Anesthesiology

## 2020-03-02 ENCOUNTER — Inpatient Hospital Stay (HOSPITAL_COMMUNITY)
Admission: AD | Admit: 2020-03-02 | Discharge: 2020-03-04 | DRG: 807 | Disposition: A | Discharge status: Home / Self Care | Payer: PRIVATE HEALTH INSURANCE | Attending: Obstetrics and Gynecology | Admitting: Obstetrics and Gynecology

## 2020-03-02 ENCOUNTER — Other Ambulatory Visit: Payer: Self-pay

## 2020-03-02 ENCOUNTER — Encounter (HOSPITAL_COMMUNITY): Payer: Self-pay | Admitting: Obstetrics and Gynecology

## 2020-03-02 DIAGNOSIS — O26893 Other specified pregnancy related conditions, third trimester: Secondary | ICD-10-CM | POA: Diagnosis present

## 2020-03-02 DIAGNOSIS — O34211 Maternal care for low transverse scar from previous cesarean delivery: Secondary | ICD-10-CM | POA: Diagnosis present

## 2020-03-02 DIAGNOSIS — O34219 Maternal care for unspecified type scar from previous cesarean delivery: Secondary | ICD-10-CM | POA: Diagnosis present

## 2020-03-02 DIAGNOSIS — O134 Gestational [pregnancy-induced] hypertension without significant proteinuria, complicating childbirth: Principal | ICD-10-CM | POA: Diagnosis present

## 2020-03-02 DIAGNOSIS — O99824 Streptococcus B carrier state complicating childbirth: Secondary | ICD-10-CM | POA: Diagnosis present

## 2020-03-02 DIAGNOSIS — Z20822 Contact with and (suspected) exposure to covid-19: Secondary | ICD-10-CM | POA: Diagnosis present

## 2020-03-02 DIAGNOSIS — O99214 Obesity complicating childbirth: Secondary | ICD-10-CM | POA: Diagnosis present

## 2020-03-02 DIAGNOSIS — Z3A39 39 weeks gestation of pregnancy: Secondary | ICD-10-CM

## 2020-03-02 DIAGNOSIS — Z98891 History of uterine scar from previous surgery: Secondary | ICD-10-CM

## 2020-03-02 DIAGNOSIS — O139 Gestational [pregnancy-induced] hypertension without significant proteinuria, unspecified trimester: Secondary | ICD-10-CM | POA: Diagnosis not present

## 2020-03-02 LAB — CBC
HCT: 39.1 % (ref 36.0–46.0)
Hemoglobin: 13.2 g/dL (ref 12.0–15.0)
MCH: 31.4 pg (ref 26.0–34.0)
MCHC: 33.8 g/dL (ref 30.0–36.0)
MCV: 93.1 fL (ref 80.0–100.0)
Platelets: 195 10*3/uL (ref 150–400)
RBC: 4.2 MIL/uL (ref 3.87–5.11)
RDW: 14.4 % (ref 11.5–15.5)
WBC: 12.2 10*3/uL — ABNORMAL HIGH (ref 4.0–10.5)
nRBC: 0 % (ref 0.0–0.2)

## 2020-03-02 LAB — COMPREHENSIVE METABOLIC PANEL
ALT: 17 U/L (ref 0–44)
AST: 20 U/L (ref 15–41)
Albumin: 2.3 g/dL — ABNORMAL LOW (ref 3.5–5.0)
Alkaline Phosphatase: 125 U/L (ref 38–126)
Anion gap: 7 (ref 5–15)
BUN: 7 mg/dL (ref 6–20)
CO2: 21 mmol/L — ABNORMAL LOW (ref 22–32)
Calcium: 9.2 mg/dL (ref 8.9–10.3)
Chloride: 109 mmol/L (ref 98–111)
Creatinine, Ser: 0.78 mg/dL (ref 0.44–1.00)
GFR calc Af Amer: >60 mL/min (ref >60)
GFR calc non Af Amer: >60 mL/min (ref >60)
Glucose, Bld: 89 mg/dL (ref 70–99)
Potassium: 3.5 mmol/L (ref 3.5–5.1)
Sodium: 137 mmol/L (ref 135–145)
Total Bilirubin: 0.5 mg/dL (ref 0.3–1.2)
Total Protein: 5.5 g/dL — ABNORMAL LOW (ref 6.5–8.1)

## 2020-03-02 LAB — SARS CORONAVIRUS 2 (TAT 6-24 HRS): SARS Coronavirus 2: NEGATIVE

## 2020-03-02 LAB — TYPE AND SCREEN
ABO/RH(D): O POS
Antibody Screen: NEGATIVE

## 2020-03-02 LAB — ABO/RH: ABO/RH(D): O POS

## 2020-03-02 MED ORDER — FLEET ENEMA 7-19 GM/118ML RE ENEM: 1.0000 | ENEMA | Freq: Every day | RECTAL | Status: DC | PRN

## 2020-03-02 MED ORDER — EPHEDRINE 5 MG/ML INJ: 10.0000 mg | INTRAVENOUS | Status: DC | PRN

## 2020-03-02 MED ORDER — WITCH HAZEL-GLYCERIN EX PADS: 1.0000 "application " | MEDICATED_PAD | CUTANEOUS | Status: DC | PRN

## 2020-03-02 MED ORDER — OXYTOCIN 40 UNITS IN NORMAL SALINE INFUSION - SIMPLE MED
2.5000 [IU]/h | INTRAVENOUS | Status: DC
  Filled 2020-03-02: qty 1000

## 2020-03-02 MED ORDER — PENICILLIN G POT IN DEXTROSE 60000 UNIT/ML IV SOLN
3.0000 10*6.[IU] | INTRAVENOUS | Status: DC
  Filled 2020-03-02 (×3): qty 50

## 2020-03-02 MED ORDER — DIBUCAINE (PERIANAL) 1 % EX OINT: 1.0000 "application " | TOPICAL_OINTMENT | CUTANEOUS | Status: DC | PRN

## 2020-03-02 MED ORDER — PRENATAL MULTIVITAMIN CH
1.0000 | ORAL_TABLET | Freq: Every day | ORAL | Status: DC
  Administered 2020-03-03 – 2020-03-04 (×2): 1 via ORAL
  Filled 2020-03-02 (×2): qty 1

## 2020-03-02 MED ORDER — BISACODYL 10 MG RE SUPP: 10.0000 mg | Freq: Every day | RECTAL | Status: DC | PRN

## 2020-03-02 MED ORDER — ONDANSETRON HCL 4 MG/2ML IJ SOLN: 4.0000 mg | Freq: Four times a day (QID) | INTRAMUSCULAR | Status: DC | PRN

## 2020-03-02 MED ORDER — PENICILLIN G POT IN DEXTROSE 60000 UNIT/ML IV SOLN: 3.0000 10*6.[IU] | INTRAVENOUS | Status: DC

## 2020-03-02 MED ORDER — ONDANSETRON HCL 4 MG PO TABS: 4.0000 mg | ORAL_TABLET | ORAL | Status: DC | PRN

## 2020-03-02 MED ORDER — SODIUM CHLORIDE 0.9 % IV SOLN
5.0000 10*6.[IU] | Freq: Once | INTRAVENOUS | Status: AC
  Administered 2020-03-02: 5 10*6.[IU] via INTRAVENOUS
  Filled 2020-03-02: qty 5

## 2020-03-02 MED ORDER — BENZOCAINE-MENTHOL 20-0.5 % EX AERO: 1.0000 "application " | INHALATION_SPRAY | CUTANEOUS | Status: DC | PRN

## 2020-03-02 MED ORDER — OXYCODONE-ACETAMINOPHEN 5-325 MG PO TABS: 2.0000 | ORAL_TABLET | ORAL | Status: DC | PRN

## 2020-03-02 MED ORDER — OXYTOCIN BOLUS FROM INFUSION
500.0000 mL | Freq: Once | INTRAVENOUS | Status: AC
  Administered 2020-03-02: 20:00:00 500 mL via INTRAVENOUS

## 2020-03-02 MED ORDER — PENICILLIN G POTASSIUM 5000000 UNITS IJ SOLR: 5.0000 10*6.[IU] | Freq: Once | INTRAMUSCULAR | Status: DC

## 2020-03-02 MED ORDER — SOD CITRATE-CITRIC ACID 500-334 MG/5ML PO SOLN: 30.0000 mL | ORAL | Status: DC | PRN

## 2020-03-02 MED ORDER — ACETAMINOPHEN 325 MG PO TABS
650.0000 mg | ORAL_TABLET | ORAL | Status: DC | PRN
  Administered 2020-03-03 (×2): 650 mg via ORAL
  Filled 2020-03-02 (×2): qty 2

## 2020-03-02 MED ORDER — SODIUM CHLORIDE (PF) 0.9 % IJ SOLN
INTRAMUSCULAR | Status: DC | PRN
  Administered 2020-03-02: 12 mL/h via EPIDURAL

## 2020-03-02 MED ORDER — PHENYLEPHRINE 40 MCG/ML (10ML) SYRINGE FOR IV PUSH (FOR BLOOD PRESSURE SUPPORT): 80.0000 ug | PREFILLED_SYRINGE | INTRAVENOUS | Status: DC | PRN

## 2020-03-02 MED ORDER — OXYTOCIN 40 UNITS IN NORMAL SALINE INFUSION - SIMPLE MED
INTRAVENOUS | Status: AC
  Filled 2020-03-02: qty 1000

## 2020-03-02 MED ORDER — LACTATED RINGERS IV SOLN: INTRAVENOUS | Status: DC

## 2020-03-02 MED ORDER — LIDOCAINE-EPINEPHRINE (PF) 2 %-1:200000 IJ SOLN
INTRAMUSCULAR | Status: DC | PRN
  Administered 2020-03-02 (×2): 2 mL via EPIDURAL

## 2020-03-02 MED ORDER — LACTATED RINGERS IV SOLN
500.0000 mL | Freq: Once | INTRAVENOUS | Status: AC
  Administered 2020-03-02: 17:00:00 500 mL via INTRAVENOUS

## 2020-03-02 MED ORDER — ZOLPIDEM TARTRATE 5 MG PO TABS: 5.0000 mg | ORAL_TABLET | Freq: Every evening | ORAL | Status: DC | PRN

## 2020-03-02 MED ORDER — FENTANYL CITRATE (PF) 100 MCG/2ML IJ SOLN: 50.0000 ug | INTRAMUSCULAR | Status: DC | PRN

## 2020-03-02 MED ORDER — LIDOCAINE HCL (PF) 1 % IJ SOLN: 30.0000 mL | INTRAMUSCULAR | Status: DC | PRN

## 2020-03-02 MED ORDER — TETANUS-DIPHTH-ACELL PERTUSSIS 5-2.5-18.5 LF-MCG/0.5 IM SUSP: 0.5000 mL | Freq: Once | INTRAMUSCULAR | Status: DC

## 2020-03-02 MED ORDER — COCONUT OIL OIL: 1.0000 "application " | TOPICAL_OIL | Status: DC | PRN

## 2020-03-02 MED ORDER — FENTANYL-BUPIVACAINE-NACL 0.5-0.125-0.9 MG/250ML-% EP SOLN
12.0000 mL/h | EPIDURAL | Status: DC | PRN
  Filled 2020-03-02: qty 250

## 2020-03-02 MED ORDER — LACTATED RINGERS IV SOLN: 500.0000 mL | INTRAVENOUS | Status: DC | PRN

## 2020-03-02 MED ORDER — OXYCODONE-ACETAMINOPHEN 5-325 MG PO TABS: 1.0000 | ORAL_TABLET | ORAL | Status: DC | PRN

## 2020-03-02 MED ORDER — ONDANSETRON HCL 4 MG/2ML IJ SOLN: 4.0000 mg | INTRAMUSCULAR | Status: DC | PRN

## 2020-03-02 MED ORDER — DIPHENHYDRAMINE HCL 25 MG PO CAPS: 25.0000 mg | ORAL_CAPSULE | Freq: Four times a day (QID) | ORAL | Status: DC | PRN

## 2020-03-02 MED ORDER — SIMETHICONE 80 MG PO CHEW: 80.0000 mg | CHEWABLE_TABLET | ORAL | Status: DC | PRN

## 2020-03-02 MED ORDER — IBUPROFEN 600 MG PO TABS
600.0000 mg | ORAL_TABLET | Freq: Four times a day (QID) | ORAL | Status: DC
  Administered 2020-03-02 – 2020-03-04 (×7): 600 mg via ORAL
  Filled 2020-03-02 (×7): qty 1

## 2020-03-02 MED ORDER — ACETAMINOPHEN 325 MG PO TABS
650.0000 mg | ORAL_TABLET | ORAL | Status: DC | PRN
  Administered 2020-03-02: 650 mg via ORAL
  Filled 2020-03-02: qty 2

## 2020-03-02 MED ORDER — SENNOSIDES-DOCUSATE SODIUM 8.6-50 MG PO TABS
2.0000 | ORAL_TABLET | ORAL | Status: DC
  Administered 2020-03-02 – 2020-03-03 (×2): 2 via ORAL
  Filled 2020-03-02 (×2): qty 2

## 2020-03-02 MED ORDER — DIPHENHYDRAMINE HCL 50 MG/ML IJ SOLN: 12.5000 mg | INTRAMUSCULAR | Status: DC | PRN

## 2020-03-02 NOTE — Anesthesia Procedure Notes (Addendum)

## 2020-03-02 NOTE — Progress Notes (Signed)
Pt. Interviewed.  Denies any history of anesthesia complications in her or family.  Denies history of MH.  Pt. Reports no pertinent medical history or problems with this pregnancy.   Questions and concerns verbalized by patient addressed.  Pt. Informed anesthesia team is available 24/7 as needed.

## 2020-03-02 NOTE — H&P (Signed)
Tina [name] is a 29 y.o. female, [id number] at  weeks, presenting for active labor.  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  Pt desires any current lesions.  Patient Active Problem List   Diagnosis Date Noted  . VBAC, delivered, current hospitalization 10/21/2016  . Second-degree perineal laceration, with delivery 10/21/2016  . Periurethral laceration, delivered, current hospitalization 10/21/2016  . Encounter for trial of labor 10/20/2016  . Decreased fetal movement during pregnancy in third trimester, antepartum 09/16/2016  . Marginal insertion of umbilical cord 09/16/2016  . Previous cesarean section 09/16/2016  . Hx of incompetent cervix, currently pregnant 09/16/2016  . BMI 40.0-44.9, adult (HCC) 09/16/2016  . HPV test positive 09/16/2016  . 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 Preterm delivery--2015, 22 weeks, iufd 07/08/2015  . Positive GBS test 07/08/2015  . Rubella non-immune status, antepartum 07/08/2015    History of present pregnancy: Patient entered care at 13.6 weeks.   EDC of 03/10/2019 was established by LMP/US.   Anatomy scan:  20 weeks, with normal findings and an posterior placenta.   Additional [place] evaluations:  For monthly growth Significant prenatal events:   Last evaluation: Today in office  OB History    Gravida  5   Para  3   Term  2   Preterm  1   AB  1   Living  2     SAB  1   TAB      Ectopic      Multiple  0   Live Births  2          Past Medical History:  Diagnosis Date  . Preterm labor    Past Surgical History:  Procedure Laterality Date  . CESAREAN SECTION N/A 07/09/2015   Procedure: CESAREAN SECTION;  Surgeon: Osborn Coho, MD;  Location: [facility];  Service: Obstetrics;  Laterality: N/A;  . WISDOM TOOTH EXTRACTION  2015   Family History: family history includes Alcohol abuse in her maternal grandfather; COPD in her maternal  grandfather; Diabetes in her paternal aunt and paternal grandmother. Social History:  reports that she has never smoked. She has never used smokeless tobacco. She reports that she does not drink alcohol or use drugs.   Prenatal Transfer Tool  Maternal Diabetes: No Genetic Screening: Declined Maternal Ultrasounds/Referrals: Normal Fetal Ultrasounds or other Referrals:  None Maternal Substance Abuse:  No Significant Maternal Medications:  None Significant Maternal Lab Results: Group B Strep positive  TDAP UTD Flu UTD  ROS: All 10 systems reviewed and negative except as stated above  No Known Allergies   Dilation: 5 Blood pressure (!) 149/93, pulse 100, temperature 98.8 F (37.1 C), temperature source Oral, last menstrual period 06/04/2019, unknown if currently breastfeeding.  Chest clear Heart RRR without murmur Abd gravid, NT, FH appropriate Pelvic: 5/90/-2 Ext: No edema  FHR: Category 1 UCs:  Every 10 minutes  Prenatal labs: ABO, Rh: --/--/O POS (03/05 1438) Antibody: NEG (03/05 1438) Rubella:   Immune RPR:   NR HBsAg:   Neg HIV:   NR GBS: Positive/-- (10/08 0000) Sickle cell/Hgb electrophoresis:  AA GC: Neg Chlamydia: Neg Genetic screenings: Declined Glucola:  70 Other:   Hgb  11.9 at 28 weeks       Assessment/Plan: IUP at 39 week in active labor Cat 1 strip Previous LTCS with successful TOLAC GBS positive Morbid obesity Hx of HSV  Plan: Admit to [employer]  Routine [facility] orders Pain med/epidural prn PCN G for GBS prophylaxis   Pleas Koch ProtheroCNM, MSN 03/02/2020, 4:14 PM

## 2020-03-02 NOTE — Anesthesia Preprocedure Evaluation (Signed)
Anesthesia Evaluation  Patient identified by MRN, date of birth, ID band Patient awake    Reviewed: Allergy & Precautions, NPO status , Patient's Chart, lab work & pertinent test results  Airway Mallampati: III  TM Distance: >3 FB Neck ROM: Full    Dental no notable dental hx.    Pulmonary neg pulmonary ROS,    Pulmonary exam normal breath sounds clear to auscultation       Cardiovascular negative cardio ROS Normal cardiovascular exam Rhythm:Regular Rate:Normal     Neuro/Psych negative neurological ROS  negative psych ROS   GI/Hepatic negative GI ROS, Neg liver ROS,   Endo/Other  Morbid obesity  Renal/GU negative Renal ROS  negative genitourinary   Musculoskeletal negative musculoskeletal ROS (+)   Abdominal   Peds  Hematology negative hematology ROS (+)   Anesthesia Other Findings   Reproductive/Obstetrics (+) Pregnancy                             Anesthesia Physical Anesthesia Plan  ASA: III  Anesthesia Plan: Epidural   Post-op Pain Management:    Induction:   PONV Risk Score and Plan: Treatment may vary due to age or medical condition  Airway Management Planned: Natural Airway  Additional Equipment:   Intra-op Plan:   Post-operative Plan:   Informed Consent: I have reviewed the patients History and Physical, chart, labs and discussed the procedure including the risks, benefits and alternatives for the proposed anesthesia with the patient or authorized representative who has indicated his/her understanding and acceptance.       Plan Discussed with: Anesthesiologist  Anesthesia Plan Comments: (Patient identified. Risks, benefits, options discussed with patient including but not limited to bleeding, infection, nerve damage, paralysis, failed block, incomplete pain control, headache, blood pressure changes, nausea, vomiting, reactions to medication, itching, and post  partum back pain. Confirmed with bedside nurse the patient's most recent platelet count. Confirmed with the patient that they are not taking any anticoagulation, have any bleeding history or any family history of bleeding disorders. Patient expressed understanding and wishes to proceed. All questions were answered. )        Anesthesia Quick Evaluation

## 2020-03-02 NOTE — Progress Notes (Signed)
Tina [name] is a 29 y.o. [phone] at 60w0dadmitted for active labor  Subjective:  Comfortable with  Labor support without medications Contractions every 5-10 minutes, lasting 30-45 seconds, intensity 5/10    Objective: BP (!) 149/93   Pulse 100   Temp 98.8 F (37.1 C) (Oral)   LMP 06/04/2019  No intake/output data recorded. No intake/output data recorded.  FHT: Category 1 SVE:   Dilation: 5   Speculum exam at bedside for h/o HSV without previous outbreak: Vulva/vagina/cervix without lesion  Labs: Lab Results  Component Value Date   WBC 12.2 (H) 03/02/2020   HGB 13.2 03/02/2020   HCT 39.1 03/02/2020   MCV 93.1 03/02/2020   PLT 195 03/02/2020    Assessment / Plan: Increase BP: willorder PIH labs GBS prophylaxis ordered Plan AROM to augment labor  Tina [name] 03/02/2020, 4:04 PM

## 2020-03-02 NOTE — Anesthesia Procedure Notes (Deleted)
Epidural

## 2020-03-03 LAB — CBC
HCT: 37.2 % (ref 36.0–46.0)
Hemoglobin: 12.4 g/dL (ref 12.0–15.0)
MCH: 31.1 pg (ref 26.0–34.0)
MCHC: 33.3 g/dL (ref 30.0–36.0)
MCV: 93.2 fL (ref 80.0–100.0)
Platelets: 179 10*3/uL (ref 150–400)
RBC: 3.99 MIL/uL (ref 3.87–5.11)
RDW: 14.3 % (ref 11.5–15.5)
WBC: 15.6 10*3/uL — ABNORMAL HIGH (ref 4.0–10.5)
nRBC: 0 % (ref 0.0–0.2)

## 2020-03-03 LAB — PROTEIN / CREATININE RATIO, URINE
Creatinine, Urine: 64.3 mg/dL
Total Protein, Urine: <6 mg/dL

## 2020-03-03 LAB — RPR: RPR Ser Ql: NONREACTIVE

## 2020-03-03 MED ORDER — NIFEDIPINE ER OSMOTIC RELEASE 30 MG PO TB24
30.0000 mg | ORAL_TABLET | Freq: Every day | ORAL | Status: DC
  Administered 2020-03-03 – 2020-03-04 (×2): 30 mg via ORAL
  Filled 2020-03-03 (×2): qty 1

## 2020-03-03 NOTE — Anesthesia Postprocedure Evaluation (Signed)
Anesthesia Post Note  Patient: Materials engineer  Procedure(s) Performed: AN AD HOC LABOR EPIDURAL     Patient location during evaluation: Mother Baby Anesthesia Type: Epidural Level of consciousness: awake and alert, oriented and patient cooperative Pain management: pain level controlled Vital Signs Assessment: post-procedure vital signs reviewed and stable Respiratory status: spontaneous breathing Cardiovascular status: stable Postop Assessment: no headache, epidural receding, patient able to bend at knees and no signs of nausea or vomiting Anesthetic complications: no Comments: Pt. States she is walking.  Pain score 5.  Pt. Encouraged to communicate pain needs to bedside RN.     Last Vitals:  Vitals:   03/03/20 0237 03/03/20 0616  BP: 137/90 (!) 142/94  Pulse: 71 71  Resp: 18 18  Temp: 36.6 C   SpO2: 100% 100%    Last Pain:  Vitals:   03/03/20 0616  TempSrc:   PainSc: 5    Pain Goal:                   [facility]

## 2020-03-03 NOTE — Progress Notes (Addendum)
Patient ID: Tina [name], female   DOB: 1991-06-22, 29 y.o.   MRN: [id number] PPD # 1 S/P NSVD  Live born female  Birth Weight: 7 lb 7.6 oz (3391 g) APGAR: 8, 9  Newborn Delivery   Birth date/time: 03/02/2020 19:31:00 Delivery type: Vaginal, Spontaneous     Baby name: Zayleigh Delivering provider: Neta Mends  Episiotomy:None   Lacerations:None    Feeding: breast and bottle  Pain control at delivery: Epidural   S:  Reports feeling well. Denies HA/NV/RUQ pain.             Tolerating po/ No nausea or vomiting             Bleeding is decreased.             Pain controlled with ibuprofen (OTC)             Up ad lib / ambulatory / voiding without difficulties   O:  A & O x 3, in no apparent distress              VS:  Vitals:   03/02/20 2120 03/02/20 2212 03/03/20 0237 03/03/20 0616  BP: (!) 130/98 (!) 144/76 137/90 (!) 142/94  Pulse: 75 72 71 71  Resp: 18 18 18 18   Temp: 97.9 F (36.6 C)  97.8 F (36.6 C)   TempSrc: Oral  Oral   SpO2: 100% 98% 100% 100%    LABS:  Recent Labs    03/02/20 1438 03/03/20 0549  WBC 12.2* 15.6*  HGB 13.2 12.4  HCT 39.1 37.2  PLT 195 179    Blood type: --/--/O POS, O POS Performed at [facility], [address] [place], [place]  (03/05 1438)  Rubella:   immune  I&O: I/O last 3 completed shifts: In: -  Out: 250 [Blood:250]          No intake/output data recorded.  Vaccines: TDaP UTD         Flu    UTD   Lungs: Clear and unlabored  Heart: regular rate and rhythm / no murmurs  Abdomen: soft, non-tender, non-distended             Fundus: firm, non-tender, U-1  Perineum: intact  Lochia: small  Extremities: trace pedal edema, no calf pain or tenderness    A/P: PPD # 1 29 y.o., 37   Principal Problem:   Postpartum care following vaginal delivery 3/5 Active Problems:   Previous cesarean section   VBAC, delivered, current hospitalization Gestational HTN  - BP mild range, no neural sx, labs stable  -  start Procardia 30 xl daily  - 1 week F/U in office   Doing well - stable status  Routine post partum orders  Anticipate discharge tomorrow    [id number], MSN, CNM 03/03/2020, 9:12 AM   Addendum: Dr. 05/03/2020 updated with POC, PCR lab on cath UA ordered per MD request.  Dion Body, MSN, CNM 03/03/2020, 11:20 AM

## 2020-03-04 DIAGNOSIS — O139 Gestational [pregnancy-induced] hypertension without significant proteinuria, unspecified trimester: Secondary | ICD-10-CM | POA: Diagnosis not present

## 2020-03-04 MED ORDER — NIFEDIPINE ER 30 MG PO TB24: 30.0000 mg | ORAL_TABLET | Freq: Every day | ORAL | 0 refills | Status: DC

## 2020-03-04 MED ORDER — IBUPROFEN 600 MG PO TABS: 600.0000 mg | ORAL_TABLET | Freq: Four times a day (QID) | ORAL | 0 refills | Status: DC

## 2020-03-04 NOTE — Discharge Summary (Signed)
VBAC OB Discharge Summary     Patient Name: Tina [name] DOB: 1991/07/03 MRN: [id number]  Date of admission: 03/02/2020 Delivering MD: Neta Mends  Date of delivery: 03/02/2020 Type of delivery: VBAC  Newborn Data: Sex: Baby female Live born female  Birth Weight: 7 lb 7.6 oz (3391 g) APGAR: 8, 9  Newborn Delivery   Birth date/time: 03/02/2020 19:31:00 Delivery type: Vaginal, Spontaneous      Feeding: breast and bottle Infant being discharge to home with mother in stable condition.   Admitting diagnosis: Normal labor [O80, Z37.9] Intrauterine pregnancy: [redacted]w[redacted]d     Secondary diagnosis:  Principal Problem:   Normal postpartum course Active Problems:   Previous cesarean section   VBAC, delivered, current hospitalization   Gestational hypertension                                Complications: None                                                              Intrapartum Procedures: spontaneous vaginal delivery and GBS prophylaxis Postpartum Procedures: none Complications-Operative and Postpartum: none Augmentation: None   History of Present Illness: Ms. Shannelle Alguire is a 29 y.o. female, [phone], who presents at 107w0d weeks gestation. The patient has been followed at  [facility]  Her pregnancy has been complicated by:  Patient Active Problem List   Diagnosis Date Noted  . Gestational hypertension 03/04/2020  . Normal postpartum course 03/02/2020  . VBAC, delivered, current hospitalization 10/21/2016  . Previous cesarean section 09/16/2016  . BMI 40.0-44.9, adult (HCC) 09/16/2016  . HPV test positive 09/16/2016  . 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 Preterm delivery--2015, 22 weeks, iufd 07/08/2015    Hospital course:  Onset of Labor With Vaginal Delivery     29 y.o. yo [id number] at [redacted]w[redacted]d was admitted in Active Labor on 03/02/2020. Patient had an uncomplicated labor course as follows:  Membrane Rupture Time/Date: 7:29 PM ,03/02/2020    Intrapartum Procedures: Episiotomy: None [1]                                         Lacerations:  None [1]  Patient had a delivery of a Viable infant. 03/02/2020  Information for the patient's newborn:  Marlon, Vonruden Girl Ayden [[id number]]  Delivery Method: Vaginal, Spontaneous(Filed from Delivery Summary)     Pateint had an uncomplicated postpartum course.  She is ambulating, tolerating a regular diet, passing flatus, and urinating well. Patient is discharged home in stable condition on 03/04/20.  Postpartum Day # 2 : S/P NSVD due to VBAC in active labor. Patient up ad lib, denies syncope or dizziness. Reports consuming regular diet without issues and denies N/V. Patient reports 0 bowel movement + passing flatus.  Denies issues with urination and reports bleeding is "lighter."  Patient is breast and bottle feeding and reports going well. Baby female under bili light now for jaundice.  Desires minipill for postpartum contraception.  Pain is being appropriately managed with use of po meds. Pt dx with GHTN during labor and PP, current BP is 118/79, denies HA, RUQ  pain or vision changes. Started procardia XL 30mg  daily on 3/6, tolerates well.  Physical exam  Vitals:   03/03/20 1600 03/03/20 1952 03/03/20 2143 03/04/20 0536  BP: 123/80 122/80 108/60 118/79  Pulse: 75 80 83 69  Resp: 20 18 16 14   Temp: 98 F (36.7 C) 97.6 F (36.4 C) 98 F (36.7 C) 98.2 F (36.8 C)  TempSrc: Oral Oral Oral Oral  SpO2: 100% 100% 99% 100%   General: alert, cooperative and no distress Lochia: appropriate Uterine Fundus: firm Incision: Healing well with no significant drainage, No significant erythema, Dressing is clean, dry, and intact, honeycomb dressing CDI Perineum: Intact DVT Evaluation: No evidence of DVT seen on physical exam. Negative Homan's sign. No cords or calf tenderness. No significant calf/ankle edema.  Labs: Lab Results  Component Value Date   WBC 15.6 (H) 03/03/2020   HGB 12.4 03/03/2020    HCT 37.2 03/03/2020   MCV 93.2 03/03/2020   PLT 179 03/03/2020   CMP Latest Ref Rng & Units 03/02/2020  Glucose 70 - 99 mg/dL 89  BUN 6 - 20 mg/dL 7  Creatinine 0.44 - 1.00 mg/dL 0.78  Sodium 135 - 145 mmol/L 137  Potassium 3.5 - 5.1 mmol/L 3.5  Chloride 98 - 111 mmol/L 109  CO2 22 - 32 mmol/L 21(L)  Calcium 8.9 - 10.3 mg/dL 9.2  Total Protein 6.5 - 8.1 g/dL 5.5(L)  Total Bilirubin 0.3 - 1.2 mg/dL 0.5  Alkaline Phos 38 - 126 U/L 125  AST 15 - 41 U/L 20  ALT 0 - 44 U/L 17    Date of discharge: 03/04/2020 Discharge Diagnoses: Term Pregnancy-delivered Discharge instruction: per After Visit Summary and "Baby and Me Booklet".  After visit meds:   Activity:           unrestricted and pelvic rest Advance as tolerated. Pelvic rest for 6 weeks.  Diet:                routine Medications: PNV, Ibuprofen and Procardia 30mg  XL daily  Postpartum contraception: Progesterone only pills Condition:  Pt discharge to home with baby in stable GHTN: F/u in 5 days for BP check at office, report vision changes, HA that do not go away with tyleon, and RUQ pain, pt verbalized understanding.   Meds: Allergies as of 03/04/2020   No Known Allergies     Medication List    TAKE these medications   acetaminophen 500 MG tablet Commonly known as: TYLENOL Take 500 mg by mouth every 6 (six) hours as needed for mild pain or headache.   cyclobenzaprine 10 MG tablet Commonly known as: FLEXERIL Take 10 mg by mouth 3 (three) times daily as needed for muscle spasms.   ibuprofen 600 MG tablet Commonly known as: ADVIL Take 1 tablet (600 mg total) by mouth every 6 (six) hours.   NIFEdipine 30 MG 24 hr tablet Commonly known as: ADALAT CC Take 1 tablet (30 mg total) by mouth daily.   prenatal multivitamin Tabs tablet Take 1 tablet by mouth daily at 12 noon.       Discharge Follow Up:  Follow-up [facility]. Schedule an appointment as soon as possible for a  visit in 1 week(s).   Specialty: Obstetrics and Gynecology Why: Please make an appointment with [facility] in 5 day for s BP check, 6 week PPV.  Contact information: [address] [place] [phone]  Allgood, NP-C, CNM 03/04/2020, 12:06 PM  [place], FNP

## 2020-03-07 ENCOUNTER — Other Ambulatory Visit (HOSPITAL_COMMUNITY): Payer: PRIVATE HEALTH INSURANCE

## 2020-03-09 ENCOUNTER — Inpatient Hospital Stay (HOSPITAL_COMMUNITY): Payer: PRIVATE HEALTH INSURANCE

## 2020-12-29 NOTE — L&D Delivery Note (Signed)
Delivery Note At 9:05 AM a viable and healthy female was delivered via SVD Successful VBAC (Presentation: Left Occiput Anterior).   APGAR: 9, 9; weight pending Placenta status: Spontaneous, Intact. Patient desires to take placenta home Cord: 3 vessels   Anesthesia: Epidural Episiotomy: None Lacerations: None Suture Repair:  NA Est. Blood Loss (mL): 100  Mom to postpartum.  Baby to Couplet care / Skin to Skin.  Victorino Dike B Lynee Rosenbach 09/08/2021, 9:44 AM

## 2021-03-05 LAB — OB RESULTS CONSOLE ANTIBODY SCREEN: Antibody Screen: NEGATIVE

## 2021-03-05 LAB — OB RESULTS CONSOLE HEPATITIS B SURFACE ANTIGEN: Hepatitis B Surface Ag: NEGATIVE

## 2021-03-05 LAB — OB RESULTS CONSOLE RPR: RPR: NONREACTIVE

## 2021-03-05 LAB — OB RESULTS CONSOLE ABO/RH: RH Type: POSITIVE

## 2021-03-05 LAB — OB RESULTS CONSOLE GC/CHLAMYDIA
Chlamydia: NEGATIVE
Gonorrhea: NEGATIVE

## 2021-03-05 LAB — OB RESULTS CONSOLE RUBELLA ANTIBODY, IGM: Rubella: IMMUNE

## 2021-03-05 LAB — OB RESULTS CONSOLE HIV ANTIBODY (ROUTINE TESTING): HIV: NONREACTIVE

## 2021-04-19 ENCOUNTER — Encounter (HOSPITAL_COMMUNITY): Payer: Self-pay | Admitting: Obstetrics and Gynecology

## 2021-04-19 ENCOUNTER — Inpatient Hospital Stay (HOSPITAL_COMMUNITY)
Admission: AD | Admit: 2021-04-19 | Discharge: 2021-04-19 | Disposition: A | Discharge status: Home / Self Care | Payer: Commercial Managed Care - PPO | Attending: Obstetrics and Gynecology | Admitting: Obstetrics and Gynecology

## 2021-04-19 ENCOUNTER — Inpatient Hospital Stay (HOSPITAL_BASED_OUTPATIENT_CLINIC_OR_DEPARTMENT_OTHER): Payer: Commercial Managed Care - PPO

## 2021-04-19 ENCOUNTER — Inpatient Hospital Stay (HOSPITAL_COMMUNITY): Payer: Commercial Managed Care - PPO

## 2021-04-19 ENCOUNTER — Other Ambulatory Visit: Payer: Self-pay

## 2021-04-19 DIAGNOSIS — Z8349 Family history of other endocrine, nutritional and metabolic diseases: Secondary | ICD-10-CM | POA: Insufficient documentation

## 2021-04-19 DIAGNOSIS — R1084 Generalized abdominal pain: Secondary | ICD-10-CM

## 2021-04-19 DIAGNOSIS — O26899 Other specified pregnancy related conditions, unspecified trimester: Secondary | ICD-10-CM

## 2021-04-19 DIAGNOSIS — M545 Low back pain, unspecified: Secondary | ICD-10-CM

## 2021-04-19 DIAGNOSIS — K8020 Calculus of gallbladder without cholecystitis without obstruction: Secondary | ICD-10-CM | POA: Insufficient documentation

## 2021-04-19 DIAGNOSIS — O26612 Liver and biliary tract disorders in pregnancy, second trimester: Secondary | ICD-10-CM | POA: Insufficient documentation

## 2021-04-19 DIAGNOSIS — N949 Unspecified condition associated with female genital organs and menstrual cycle: Secondary | ICD-10-CM | POA: Insufficient documentation

## 2021-04-19 DIAGNOSIS — Z3A19 19 weeks gestation of pregnancy: Secondary | ICD-10-CM

## 2021-04-19 DIAGNOSIS — R102 Pelvic and perineal pain: Secondary | ICD-10-CM | POA: Insufficient documentation

## 2021-04-19 DIAGNOSIS — O26892 Other specified pregnancy related conditions, second trimester: Secondary | ICD-10-CM | POA: Insufficient documentation

## 2021-04-19 LAB — WET PREP, GENITAL
Clue Cells Wet Prep HPF POC: NONE SEEN
Sperm: NONE SEEN
Trich, Wet Prep: NONE SEEN
Yeast Wet Prep HPF POC: NONE SEEN

## 2021-04-19 LAB — URINALYSIS, ROUTINE W REFLEX MICROSCOPIC
Bilirubin Urine: NEGATIVE
Glucose, UA: NEGATIVE mg/dL
Hgb urine dipstick: NEGATIVE
Ketones, ur: 20 mg/dL — AB
Leukocytes,Ua: NEGATIVE
Nitrite: NEGATIVE
Protein, ur: NEGATIVE mg/dL
Specific Gravity, Urine: 1.025 (ref 1.005–1.030)
pH: 5 (ref 5.0–8.0)

## 2021-04-19 LAB — COMPREHENSIVE METABOLIC PANEL
ALT: 10 U/L (ref 0–44)
AST: 11 U/L — ABNORMAL LOW (ref 15–41)
Albumin: 2.9 g/dL — ABNORMAL LOW (ref 3.5–5.0)
Alkaline Phosphatase: 50 U/L (ref 38–126)
Anion gap: 5 (ref 5–15)
BUN: 7 mg/dL (ref 6–20)
CO2: 26 mmol/L (ref 22–32)
Calcium: 9 mg/dL (ref 8.9–10.3)
Chloride: 108 mmol/L (ref 98–111)
Creatinine, Ser: 0.66 mg/dL (ref 0.44–1.00)
GFR, Estimated: >60 mL/min (ref >60)
Glucose, Bld: 93 mg/dL (ref 70–99)
Potassium: 3.7 mmol/L (ref 3.5–5.1)
Sodium: 139 mmol/L (ref 135–145)
Total Bilirubin: 0.3 mg/dL (ref 0.3–1.2)
Total Protein: 6.5 g/dL (ref 6.5–8.1)

## 2021-04-19 LAB — CBC WITH DIFFERENTIAL/PLATELET
Abs Immature Granulocytes: 0.04 10*3/uL (ref 0.00–0.07)
Basophils Absolute: 0 10*3/uL (ref 0.0–0.1)
Basophils Relative: 0 %
Eosinophils Absolute: 0.2 10*3/uL (ref 0.0–0.5)
Eosinophils Relative: 2 %
HCT: 36.6 % (ref 36.0–46.0)
Hemoglobin: 12.4 g/dL (ref 12.0–15.0)
Immature Granulocytes: 0 %
Lymphocytes Relative: 26 %
Lymphs Abs: 2.6 10*3/uL (ref 0.7–4.0)
MCH: 32.6 pg (ref 26.0–34.0)
MCHC: 33.9 g/dL (ref 30.0–36.0)
MCV: 96.3 fL (ref 80.0–100.0)
Monocytes Absolute: 0.7 10*3/uL (ref 0.1–1.0)
Monocytes Relative: 7 %
Neutro Abs: 6.2 10*3/uL (ref 1.7–7.7)
Neutrophils Relative %: 65 %
Platelets: 194 10*3/uL (ref 150–400)
RBC: 3.8 MIL/uL — ABNORMAL LOW (ref 3.87–5.11)
RDW: 13.8 % (ref 11.5–15.5)
WBC: 9.7 10*3/uL (ref 4.0–10.5)
nRBC: 0 % (ref 0.0–0.2)

## 2021-04-19 LAB — LIPASE, BLOOD: Lipase: 30 U/L (ref 11–51)

## 2021-04-19 LAB — AMYLASE: Amylase: 48 U/L (ref 28–100)

## 2021-04-19 NOTE — MAU Note (Signed)
Presents with c/o abdominal and back pain.  Also c/o no fetal movement.  Denies VB.  LMP 12/14/20

## 2021-04-19 NOTE — MAU Provider Note (Signed)
History     CSN: [id number]  Arrival date and time: 04/19/21 1508   Event Date/Time   First Provider Initiated Contact with Patient 04/19/21 1629      Chief Complaint  Patient presents with  . Abdominal Pain  . Back Pain   [name] is a 30 y.o. [phone] at [redacted]w[redacted]d who presents to MAU for upper abdominal cramping and low back pain that started about 2 days ago. Patient reports the pains are intermittent, but are present at this time. Patient denies any N/V/D and reports the pain in her stomach is unchanged after eating. Patient has not tried taking anything for pain. Patient denies VB/LOF. Patient also experienced a sharp pain last night one time that radiated down in to her vagina and was made worse by moving.  Pt denies VB, LOF, ctx, decreased FM, vaginal discharge/odor/itching. Pt denies N/V, constipation, diarrhea, or urinary problems. Pt denies fever, chills, fatigue, sweating or changes in appetite. Pt denies SOB or chest pain. Pt denies dizziness, HA, light-headedness, weakness.   OB History    Gravida  6   Para  4   Term  3   Preterm  1   AB  1   Living  3     SAB  1   IAB      Ectopic      Multiple  0   Live Births  3           Past Medical History:  Diagnosis Date  . Preterm labor     Past Surgical History:  Procedure Laterality Date  . CESAREAN SECTION N/A 07/09/2015   Procedure: CESAREAN SECTION;  Surgeon: Osborn Coho, MD;  Location: [facility];  Service: Obstetrics;  Laterality: N/A;  . WISDOM TOOTH EXTRACTION  2015    Family History  Problem Relation Age of Onset  . Diabetes Paternal Aunt   . Alcohol abuse Maternal Grandfather   . COPD Maternal Grandfather   . Diabetes Paternal Grandmother   . Healthy Mother   . Healthy Father   . Heart disease Paternal Uncle        dad's "brothers past away from heart attacks"  . Arthritis Neg Hx   . Asthma Neg Hx   . Birth defects Neg Hx   . Depression Neg Hx   . Drug abuse Neg Hx    . Early death Neg Hx   . Hearing loss Neg Hx   . Hyperlipidemia Neg Hx   . Hypertension Neg Hx   . Kidney disease Neg Hx   . Learning disabilities Neg Hx   . Mental illness Neg Hx   . Mental retardation Neg Hx   . Miscarriages / Stillbirths Neg Hx   . Stroke Neg Hx   . Vision loss Neg Hx   . Varicose Veins Neg Hx     Social History   Tobacco Use  . Smoking status: Never Smoker  . Smokeless tobacco: Never Used  Vaping Use  . Vaping Use: Never used  Substance Use Topics  . Alcohol use: No  . Drug use: No    Allergies: No Known Allergies  Medications Prior to Admission  Medication Sig Dispense Refill Last Dose  . acetaminophen (TYLENOL) 500 MG tablet Take 500 mg by mouth every 6 (six) hours as needed for mild pain or headache.    04/18/2021 at Unknown time  . Prenatal Vit-Fe Fumarate-FA (PRENATAL MULTIVITAMIN) TABS tablet Take 1 tablet by mouth daily at 12 noon.  04/18/2021 at Unknown time  . cyclobenzaprine (FLEXERIL) 10 MG tablet Take 10 mg by mouth 3 (three) times daily as needed for muscle spasms.    More than a month at Unknown time  . ibuprofen (ADVIL) 600 MG tablet Take 1 tablet (600 mg total) by mouth every 6 (six) hours. 30 tablet 0   . NIFEdipine (ADALAT CC) 30 MG 24 hr tablet Take 1 tablet (30 mg total) by mouth daily. 30 tablet 0     Review of Systems  Constitutional: Negative for chills, diaphoresis, fatigue and fever.  Eyes: Negative for visual disturbance.  Respiratory: Negative for shortness of breath.   Cardiovascular: Negative for chest pain.  Gastrointestinal: Positive for abdominal pain. Negative for constipation, diarrhea, nausea and vomiting.  Genitourinary: Negative for dysuria, flank pain, frequency, pelvic pain, urgency, vaginal bleeding and vaginal discharge.  Musculoskeletal: Positive for back pain.  Neurological: Negative for dizziness, weakness, light-headedness and headaches.   Physical Exam   Blood pressure 127/67, pulse 97, temperature  98.1 F (36.7 C), temperature source Oral, resp. rate 20, height 5\' 2"  (1.575 m), weight 99.5 kg, SpO2 100 %, unknown if currently breastfeeding.  Patient Vitals for the past 24 hrs:  BP Temp Temp src Pulse Resp SpO2 Height Weight  04/19/21 1557 127/67 -- -- 97 -- -- -- --  04/19/21 1534 125/60 98.1 F (36.7 C) Oral (!) 102 20 100 % -- --  04/19/21 1529 -- -- -- -- -- -- 5\' 2"  (1.575 m) 99.5 kg   Physical Exam Constitutional:      General: She is not in acute distress.    Appearance: She is well-developed. She is not diaphoretic.  HENT:     Head: Normocephalic and atraumatic.  Pulmonary:     Effort: Pulmonary effort is normal.  Abdominal:     General: There is no distension.     Palpations: Abdomen is soft. There is no mass.     Tenderness: There is no abdominal tenderness. There is no right CVA tenderness, left CVA tenderness, guarding or rebound.  Skin:    General: Skin is warm and dry.  Neurological:     Mental Status: She is alert and oriented to person, place, and time.  Psychiatric:        Behavior: Behavior normal.        Thought Content: Thought content normal.        Judgment: Judgment normal.    Results for orders placed or performed during the hospital encounter of 04/19/21 (from the past 24 hour(s))  Urinalysis, Routine w reflex microscopic Urine, Clean Catch     Status: Abnormal   Collection Time: 04/19/21  3:38 PM  Result Value Ref Range   Color, Urine YELLOW YELLOW   APPearance HAZY (A) CLEAR   Specific Gravity, Urine 1.025 1.005 - 1.030   pH 5.0 5.0 - 8.0   Glucose, UA NEGATIVE NEGATIVE mg/dL   Hgb urine dipstick NEGATIVE NEGATIVE   Bilirubin Urine NEGATIVE NEGATIVE   Ketones, ur 20 (A) NEGATIVE mg/dL   Protein, ur NEGATIVE NEGATIVE mg/dL   Nitrite NEGATIVE NEGATIVE   Leukocytes,Ua NEGATIVE NEGATIVE  Wet prep, genital     Status: Abnormal   Collection Time: 04/19/21  5:48 PM   Specimen: Vaginal  Result Value Ref Range   Yeast Wet Prep HPF POC NONE  SEEN NONE SEEN   Trich, Wet Prep NONE SEEN NONE SEEN   Clue Cells Wet Prep HPF POC NONE SEEN NONE SEEN   WBC, Wet  Prep HPF POC MODERATE (A) NONE SEEN   Sperm NONE SEEN   CBC with Differential/Platelet     Status: Abnormal   Collection Time: 04/19/21  5:53 PM  Result Value Ref Range   WBC 9.7 4.0 - 10.5 K/uL   RBC 3.80 (L) 3.87 - 5.11 MIL/uL   Hemoglobin 12.4 12.0 - 15.0 g/dL   HCT 34.1 93.7 - 90.2 %   MCV 96.3 80.0 - 100.0 fL   MCH 32.6 26.0 - 34.0 pg   MCHC 33.9 30.0 - 36.0 g/dL   RDW 40.9 73.5 - 32.9 %   Platelets 194 150 - 400 K/uL   nRBC 0.0 0.0 - 0.2 %   Neutrophils Relative % 65 %   Neutro Abs 6.2 1.7 - 7.7 K/uL   Lymphocytes Relative 26 %   Lymphs Abs 2.6 0.7 - 4.0 K/uL   Monocytes Relative 7 %   Monocytes Absolute 0.7 0.1 - 1.0 K/uL   Eosinophils Relative 2 %   Eosinophils Absolute 0.2 0.0 - 0.5 K/uL   Basophils Relative 0 %   Basophils Absolute 0.0 0.0 - 0.1 K/uL   Immature Granulocytes 0 %   Abs Immature Granulocytes 0.04 0.00 - 0.07 K/uL  Comprehensive metabolic panel     Status: Abnormal   Collection Time: 04/19/21  5:53 PM  Result Value Ref Range   Sodium 139 135 - 145 mmol/L   Potassium 3.7 3.5 - 5.1 mmol/L   Chloride 108 98 - 111 mmol/L   CO2 26 22 - 32 mmol/L   Glucose, Bld 93 70 - 99 mg/dL   BUN 7 6 - 20 mg/dL   Creatinine, Ser 9.24 0.44 - 1.00 mg/dL   Calcium 9.0 8.9 - 26.8 mg/dL   Total Protein 6.5 6.5 - 8.1 g/dL   Albumin 2.9 (L) 3.5 - 5.0 g/dL   AST 11 (L) 15 - 41 U/L   ALT 10 0 - 44 U/L   Alkaline Phosphatase 50 38 - 126 U/L   Total Bilirubin 0.3 0.3 - 1.2 mg/dL   GFR, Estimated >34 >19 mL/min   Anion gap 5 5 - 15  Lipase, blood     Status: None   Collection Time: 04/19/21  5:53 PM  Result Value Ref Range   Lipase 30 11 - 51 U/L  Amylase     Status: None   Collection Time: 04/19/21  5:53 PM  Result Value Ref Range   Amylase 48 28 - 100 U/L   US Abdomen Complete  Result Date: 04/19/2021 CLINICAL DATA:  Generalized abdominal pain.  [redacted]  weeks pregnant. EXAM: ABDOMEN ULTRASOUND COMPLETE COMPARISON:  None. FINDINGS: Gallbladder: Several small gallstones are seen, largest measuring 1.0 cm. No evidence of gallbladder dilatation or wall thickening. No sonographic Murphy sign noted by sonographer. Common bile duct: Diameter: 2 mm, within normal limits. Liver: No focal lesion identified. Within normal limits in parenchymal echogenicity. Portal vein is patent on color Doppler imaging with normal direction of blood flow towards the liver. IVC: No abnormality visualized. Pancreas: Visualized portion unremarkable. Spleen: Size and appearance within normal limits. Right Kidney: Length: 10.0 cm. Echogenicity within normal limits. No mass or hydronephrosis visualized. Left Kidney: Length: 11.7 cm. Echogenicity within normal limits. No mass or hydronephrosis visualized. Abdominal aorta: No aneurysm visualized. Other findings: None. IMPRESSION: Cholelithiasis. No sonographic signs of cholecystitis or other significant abnormality. Electronically Signed   By: Danae Orleans M.D.   On: 04/19/2021 18:35    MAU Course  Procedures  MDM -  upper abdominal cramping and LBP x2 days, intermittent -no pain on exam -suspect RLP as cause of sharp pain -UA: hazy/20ketones, otherwise WNL -CBC: WNL -CMP: no abnormalities requiring treatment -US OB: WNL -US Abdomen: incidental gallstones without evidence of cholecystitis -Lipase: WNL -Amylase: WNL -WetPrep: WNL -GC/CT collected -pt discharged to home in stable condition  Orders Placed This Encounter  Procedures  . Wet prep, genital    Standing Status:   Standing    Number of Occurrences:   1  . US Abdomen Complete    Standing Status:   Standing    Number of Occurrences:   1    Order Specific Question:   Symptom/Reason for Exam    Answer:   Generalized abdominal pain affecting pregnancy [[id number]]  . [place] MFM OB LIMITED    Standing Status:   Standing    Number of Occurrences:   1    Order Specific  Question:   Symptom/Reason for Exam    Answer:   Generalized abdominal pain affecting pregnancy [[id number]]  . Urinalysis, Routine w reflex microscopic Urine, Clean Catch    Standing Status:   Standing    Number of Occurrences:   1  . CBC with Differential/Platelet    Standing Status:   Standing    Number of Occurrences:   1  . Comprehensive metabolic panel    Standing Status:   Standing    Number of Occurrences:   1  . Lipase, blood    Standing Status:   Standing    Number of Occurrences:   1  . Amylase    Standing Status:   Standing    Number of Occurrences:   1  . Ambulatory referral to Gastroenterology    Referral Priority:   Routine    Referral Type:   Consultation    Referral Reason:   Specialty Services Required    Number of Visits Requested:   1  . Discharge patient    Order Specific Question:   Discharge disposition    Answer:   01-Home or Self Care [1]    Order Specific Question:   Discharge patient date    Answer:   04/19/2021   No orders of the defined types were placed in this encounter.  Assessment and Plan   1. Calculus of gallbladder without cholecystitis without obstruction   2. Generalized abdominal pain affecting pregnancy   3. [redacted] weeks gestation of pregnancy   4. Round ligament pain    Allergies as of 04/19/2021   No Known Allergies     Medication List    STOP taking these medications   ibuprofen 600 MG tablet Commonly known as: ADVIL     TAKE these medications   acetaminophen 500 MG tablet Commonly known as: TYLENOL Take 500 mg by mouth every 6 (six) hours as needed for mild pain or headache.   cyclobenzaprine 10 MG tablet Commonly known as: FLEXERIL Take 10 mg by mouth 3 (three) times daily as needed for muscle spasms.   NIFEdipine 30 MG 24 hr tablet Commonly known as: ADALAT CC Take 1 tablet (30 mg total) by mouth daily.   prenatal multivitamin Tabs tablet Take 1 tablet by mouth daily at 12 noon.      -will call with culture  results, if positive -discussed s/sx of RLP -discussed gallbladder diet -discussed s/sx of cholecystitis -GI referral given -return MAU precautions given -pt discharged to home in stable condition  Odie Sera Trammell Bowden 04/19/2021, 7:18 PM

## 2021-04-19 NOTE — Discharge Instructions (Signed)
Cholelithiasis  Cholelithiasis is a disease in which gallstones form in the gallbladder. The gallbladder is an organ that stores bile. Bile is a fluid that helps to digest fats. Gallstones begin as small crystals and can slowly grow into stones. They may cause no symptoms until they block the gallbladder duct, or cystic duct, when the gallbladder tightens (contracts) after food is eaten. This can cause pain and is known as a gallbladder attack, or biliary colic. There are two main types of gallstones:  Cholesterol stones. These are the most common type of gallstone. These stones are made of hardened cholesterol and are usually yellow-green in color. Cholesterol is a fat-like substance that is made in the liver.  Pigment stones. These are dark in color and are made of a red-yellow substance, called bilirubin,that forms when hemoglobin from red blood cells breaks down. What are the causes? This condition may be caused by an imbalance in the different parts that make bile. This can happen if the bile:  Has too much bilirubin. This can happen in certain blood diseases, such as sickle cell anemia.  Has too much cholesterol.  Does not have enough bile salts. These salts help the body absorb and digest fats. In some cases, this condition can also be caused by the gallbladder not emptying completely or often enough. This is common during pregnancy. What increases the risk? The following factors may make you more likely to develop this condition:  Being female.  Having multiple pregnancies. Health care providers sometimes advise removing diseased gallbladders before future pregnancies.  Eating a diet that is heavy in fried foods, fat, and refined carbohydrates, such as white bread and white rice.  Being obese.  Being older than age 40.  Using medicines that contain female hormones (estrogen) for a long time.  Losing weight quickly.  Having a family history of gallstones.  Having certain  medical problems, such as: ? Diabetes mellitus. ? Cystic fibrosis. ? Crohn's disease. ? Cirrhosis or other long-term (chronic) liver disease. ? Certain blood diseases, such as sickle cell anemia or leukemia. What are the signs or symptoms? In many cases, having gallstones causes no symptoms. When you have gallstones but do not have symptoms, you have silent gallstones. If a gallstone blocks your bile duct, it can cause a gallbladder attack. The main symptom of a gallbladder attack is sudden pain in the upper right part of the abdomen. The pain:  Usually comes at night or after eating.  Can last for one hour or more.  Can spread to your right shoulder, back, or chest.  Can feel like indigestion. This is discomfort, burning, or fullness in your upper abdomen. If the bile duct is blocked for more than a few hours, it can cause an infection or inflammation of your gallbladder (cholecystitis), liver, or pancreas. This can cause:  Nausea or vomiting.  Bloating.  Pain in your abdomen that lasts for 5 hours or longer.  Tenderness in your upper abdomen, often in the upper right section and under your rib cage.  Fever or chills.  Skin or the white parts of your eyes turning yellow (jaundice). This usually happens when a stone has blocked bile from passing through the common bile duct.  Dark urine or light-colored stools. How is this diagnosed? This condition may be diagnosed based on:  A physical exam.  Your medical history.  Ultrasound.  CT scan.  MRI. You may also have other tests, including:  Blood tests to check for signs of an   infection or inflammation.  Cholescintigraphy, or HIDA scan. This is a scan of your gallbladder and bile ducts (biliary system) using non-harmful radioactive material and special cameras that can see the radioactive material.  Endoscopic retrograde cholangiopancreatogram. This involves inserting a small tube with a camera on the end (endoscope)  through your mouth to look at bile ducts and check for blockages. How is this treated? Treatment for this condition depends on the severity of the condition. Silent gallstones do not need treatment. Treatment may be needed if a blockage causes a gallbladder attack or other symptoms. Treatment may include:  Home care, if symptoms are not severe. ? During a simple gallbladder attack, stop eating and drinking for 12-24 hours (except for water and clear liquids). This helps to "cool down" your gallbladder. After 1 or 2 days, you can start to eat a diet of simple or clear foods, such as broths and crackers. ? You may also need medicines for pain or nausea or both. ? If you have cholecystitis and an infection, you will need antibiotics.  A hospital stay, if needed for pain control or for cholecystitis with severe infection.  Cholecystectomy, or surgery to remove your gallbladder. This is the most common treatment if all other treatments have not worked.  Medicines to break up gallstones. These are most effective at treating small gallstones. Medicines may be used for up to 6-12 months.  Endoscopic retrograde cholangiopancreatogram. A small basket can be attached to the endoscope and used to capture and remove gallstones, mainly those that are in the common bile duct. Follow these instructions at home: Medicines  Take over-the-counter and prescription medicines only as told by your health care provider.  If you were prescribed an antibiotic medicine, take it as told by your health care provider. Do not stop taking the antibiotic even if you start to feel better.  Ask your health care provider if the medicine prescribed to you requires you to avoid driving or using machinery. Eating and drinking  Drink enough fluid to keep your urine pale yellow. This is important during a gallbladder attack. Water and clear liquids are preferred.  Follow a healthy diet. This includes: ? Reducing fatty foods,  such as fried food and foods high in cholesterol. ? Reducing refined carbohydrates, such as white bread and white rice. ? Eating more fiber. Aim for foods such as almonds, fruit, and beans. Alcohol use  If you drink alcohol: ? Limit how much you use to:  0-1 drink a day for nonpregnant women.  0-2 drinks a day for men. ? Be aware of how much alcohol is in your drink. In the [place] one drink equals one 12 oz bottle of beer (355 mL), one 5 oz glass of wine (148 mL), or one 1 oz glass of hard liquor (44 mL). General instructions  Do not use any products that contain nicotine or tobacco, such as cigarettes, e-cigarettes, and chewing tobacco. If you need help quitting, ask your health care provider.  Maintain a healthy weight.  Keep all follow-up visits as told by your health care provider. These may include consultations with a surgeon or specialist. This is important. Where to find more information  National Institute of Diabetes and Digestive and Kidney Diseases: www.niddk.nih.gov Contact a health care provider if:  You think you have had a gallbladder attack.  You have been diagnosed with silent gallstones and you develop pain in your abdomen or indigestion.  You begin to have attacks more often.  You have   dark urine or light-colored stools. Get help right away if:  You have pain from a gallbladder attack that lasts for more than 2 hours.  You have pain in your abdomen that lasts for more than 5 hours or is getting worse.  You have a fever or chills.  You have nausea and vomiting that do not go away.  You develop jaundice. Summary  Cholelithiasis is a disease in which gallstones form in the gallbladder.  This condition may be caused by an imbalance in the different parts that make bile. This can happen if your bile has too much bilirubin or cholesterol, or does not have enough bile salts.  Treatment for gallstones depends on the severity of the condition. Silent  gallstones do not need treatment.  If gallstones cause a gallbladder attack or other symptoms, treatment usually involves not eating or drinking anything. Treatment may also include pain medicines and antibiotics, and it sometimes includes a hospital stay.  Surgery to remove the gallbladder is common if all other treatments have not worked. This information is not intended to replace advice given to you by your health care provider. Make sure you discuss any questions you have with your health care provider. Document Revised: 11/07/2019 Document Reviewed: 11/07/2019 Elsevier Patient Education  2021 [employer]        Gallbladder Eating Plan If you have a gallbladder condition, you may have trouble digesting fats. Eating a low-fat diet can help reduce your symptoms, and may be helpful before and after having surgery to remove your gallbladder (cholecystectomy). Your health care provider may recommend that you work with a diet and nutrition specialist (dietitian) to help you reduce the amount of fat in your diet. What are tips for following this plan? General guidelines  Limit your fat intake to less than 30% of your total daily calories. If you eat around 1,800 calories each day, this is less than 60 grams (g) of fat per day.  Fat is an important part of a healthy diet. Eating a low-fat diet can make it hard to maintain a healthy body weight. Ask your dietitian how much fat, calories, and other nutrients you need each day.  Eat small, frequent meals throughout the day instead of three large meals.  Drink at least 8-10 cups of fluid a day. Drink enough fluid to keep your urine clear or pale yellow.  Limit alcohol intake to no more than 1 drink a day for nonpregnant women and 2 drinks a day for men. One drink equals 12 oz of beer, 5 oz of wine, or 1 oz of hard liquor. Reading food labels  Check Nutrition Facts on food labels for the amount of fat per serving. Choose foods with less  than 3 grams of fat per serving.   Shopping  Choose nonfat and low-fat healthy foods. Look for the words "nonfat," "low fat," or "fat free."  Avoid buying processed or prepackaged foods. Cooking  Cook using low-fat methods, such as baking, broiling, grilling, or boiling.  Cook with small amounts of healthy fats, such as olive oil, grapeseed oil, canola oil, or sunflower oil. What foods are recommended?  All fresh, frozen, or canned fruits and vegetables.  Whole grains.  Low-fat or non-fat (skim) milk and yogurt.  Lean meat, skinless poultry, fish, eggs, and beans.  Low-fat protein supplement powders or drinks.  Spices and herbs. What foods are not recommended?  High-fat foods. These include baked goods, fast food, fatty cuts of meat, ice cream, french toast,  sweet rolls, pizza, cheese bread, foods covered with butter, creamy sauces, or cheese.  Fried foods. These include french fries, tempura, battered fish, breaded chicken, fried breads, and sweets.  Foods with strong odors.  Foods that cause bloating and gas. Summary  A low-fat diet can be helpful if you have a gallbladder condition, or before and after gallbladder surgery.  Limit your fat intake to less than 30% of your total daily calories. This is about 60 g of fat if you eat 1,800 calories each day.  Eat small, frequent meals throughout the day instead of three large meals. This information is not intended to replace advice given to you by your health care provider. Make sure you discuss any questions you have with your health care provider. Document Revised: 08/02/2020 Document Reviewed: 08/02/2020 Elsevier Patient Education  2021 [employer] Inc.         Abdominal Pain During Pregnancy Abdominal pain is common during pregnancy and has many possible causes. Some causes are more serious than others, and sometimes the cause is not known. Abdominal pain can be a sign that labor is starting. It can also be caused  by normal growth of your baby causing stretching of muscles and ligaments during pregnancy. Always tell your health care provider if you have any abdominal pain. Follow these instructions at home:  Do not have sex or put anything in your vagina until your pain goes away completely.  Get plenty of rest until your pain improves.  Drink enough fluid to keep your urine pale yellow.  Take over-the-counter and prescription medicines only as told by your health care provider.  Keep all follow-up visits. This is important.   Contact a health care provider if:  Your pain continues or gets worse after resting.  You have lower abdominal pain that: ? Comes and goes at regular intervals. ? Spreads to your back. ? Is similar to menstrual cramps.  You have pain or burning when you urinate. Get help right away if:  You have a fever, chills, or shortness of breath.  You have vaginal bleeding.  You are leaking fluid or passing tissue from your vagina.  You have vomiting or diarrhea that lasts for more than 24 hours.  Your baby is moving less than usual.  You feel very weak or faint.  You develop severe pain in your upper abdomen. Summary  Abdominal pain is common during pregnancy and has many possible causes.  If you experience abdominal pain during pregnancy, tell your health care provider right away.  Follow your health care provider's home care instructions and keep all follow-up visits as told. This information is not intended to replace advice given to you by your health care provider. Make sure you discuss any questions you have with your health care provider. Document Revised: 08/28/2020 Document Reviewed: 08/28/2020 Elsevier Patient Education  2021 [employer] Inc[employer]        Round Ligament Pain  The round ligament is a cord of muscle and tissue that helps support the uterus. It can become a source of pain during pregnancy if it becomes stretched or twisted as the baby grows.  The pain usually begins in the second trimester (13-28 weeks) of pregnancy, and it can come and go until the baby is delivered. It is not a serious problem, and it does not cause harm to the baby. Round ligament pain is usually a short, sharp, and pinching pain, but it can also be a dull, lingering, and aching pain. The pain is  felt in the lower side of the abdomen or in the groin. It usually starts deep in the groin and moves up to the outside of the hip area. The pain may occur when you:  Suddenly change position, such as quickly going from a sitting to standing position.  Roll over in bed.  Cough or sneeze.  Do physical activity. Follow these instructions at home:  Watch your condition for any changes.  When the pain starts, relax. Then try any of these methods to help with the pain: ? Sitting down. ? Flexing your knees up to your abdomen. ? Lying on your side with one pillow under your abdomen and another pillow between your legs. ? Sitting in a warm bath for 15-20 minutes or until the pain goes away.  Take over-the-counter and prescription medicines only as told by your health care provider.  Move slowly when you sit down or stand up.  Avoid long walks if they cause pain.  Stop or reduce your physical activities if they cause pain.  Keep all follow-up visits as told by your health care provider. This is important.   Contact a health care provider if:  Your pain does not go away with treatment.  You feel pain in your back that you did not have before.  Your medicine is not helping. Get help right away if:  You have a fever or chills.  You develop uterine contractions.  You have vaginal bleeding.  You have nausea or vomiting.  You have diarrhea.  You have pain when you urinate. Summary  Round ligament pain is felt in the lower abdomen or groin. It is usually a short, sharp, and pinching pain. It can also be a dull, lingering, and aching pain.  This pain usually  begins in the second trimester (13-28 weeks). It occurs because the uterus is stretching with the growing baby, and it is not harmful to the baby.  You may notice the pain when you suddenly change position, when you cough or sneeze, or during physical activity.  Relaxing, flexing your knees to your abdomen, lying on one side, or taking a warm bath may help to get rid of the pain.  Get help from your health care provider if the pain does not go away or if you have vaginal bleeding, nausea, vomiting, diarrhea, or painful urination. This information is not intended to replace advice given to you by your health care provider. Make sure you discuss any questions you have with your health care provider. Document Revised: 06/02/2018 Document Reviewed: 06/02/2018 Elsevier Patient Education  2021 [employer] Inc.        Preterm Labor The normal length of a pregnancy is 39-41 weeks. Preterm labor is when labor starts before 37 completed weeks of pregnancy. Babies who are born prematurely and survive may not be fully developed and may be at an increased risk for long-term problems such as cerebral palsy, developmental delays, and vision and hearing problems. Babies who are born too early may have problems soon after birth. Problems may include regulating blood sugar, body temperature, heart rate, and breathing rate. These babies often have trouble with feeding. The risk of having problems is highest for babies who are born before 34 weeks of pregnancy. What are the causes? The exact cause of this condition is not known. What increases the risk? You are more likely to have preterm labor if you have certain risk factors that relate to your medical history, problems with present and past pregnancies, and  lifestyle factors. Medical history  You have abnormalities of the uterus, including a short cervix.  You have STIs (sexually transmitted infections), or other infections of the urinary tract and the  vagina.  You have chronic illnesses, such as blood clotting problems, diabetes, or high blood pressure.  You are overweight or underweight. Present and past pregnancies  You have had preterm labor before.  You are pregnant with twins or other multiples.  You have been diagnosed with a condition in which the placenta covers your cervix (placenta previa).  You waited less than 6 months between giving birth and becoming pregnant again.  Your unborn baby has some abnormalities.  You have vaginal bleeding during pregnancy.  You became pregnant through in vitro fertilization (IVF). Lifestyle and environmental factors  You use tobacco products.  You drink alcohol.  You use street drugs.  You have stress and no social support.  You experience domestic violence.  You are exposed to certain chemicals or environmental pollutants. Other factors  You are younger than age 33 or older than age 40. What are the signs or symptoms? Symptoms of this condition include:  Cramps similar to those that can happen during a menstrual period. The cramps may happen with diarrhea.  Pain in the abdomen or lower back.  Regular contractions that may feel like tightening of the abdomen.  A feeling of increased pressure in the pelvis.  Increased watery or bloody mucus discharge from the vagina.  Water breaking (ruptured amniotic sac). How is this diagnosed? This condition is diagnosed based on:  Your medical history and a physical exam.  A pelvic exam.  An ultrasound.  Monitoring your uterus for contractions.  Other tests, including: ? A swab of the cervix to check for a chemical called fetal fibronectin. ? Urine tests. How is this treated? Treatment for this condition depends on the length of your pregnancy, your condition, and the health of your baby. Treatment may include:  Taking medicines, such as: ? Hormone medicines. These may be given early in pregnancy to help support the  pregnancy. ? Medicines to stop contractions. ? Medicines to help mature the baby's lungs. These may be prescribed if the risk of delivery is high. ? Medicines to prevent your baby from developing cerebral palsy.  Bed rest. If the labor happens before 34 weeks of pregnancy, you may need to stay in the hospital.  Delivery of the baby. Follow these instructions at home:  Do not use any products that contain nicotine or tobacco, such as cigarettes, e-cigarettes, and chewing tobacco. If you need help quitting, ask your health care provider.  Do not drink alcohol.  Take over-the-counter and prescription medicines only as told by your health care provider.  Rest as told by your health care provider.  Return to your normal activities as told by your health care provider. Ask your health care provider what activities are safe for you.  Keep all follow-up visits as told by your health care provider. This is important.   How is this prevented? To increase your chance of having a full-term pregnancy:  Do not use street drugs or medicines that have not been prescribed to you during your pregnancy.  Talk with your health care provider before taking any herbal supplements, even if you have been taking them regularly.  Make sure you gain a healthy amount of weight during your pregnancy.  Watch for infection. If you think that you might have an infection, get it checked right away. Symptoms of  infection may include: ? Fever. ? Abnormal vaginal discharge or discharge that smells bad. ? Pain or burning with urination. ? Needing to urinate urgently. ? Frequently urinating or passing small amounts of urine frequently. ? Blood in your urine. ? Urine that smells bad or unusual.  Tell your health care provider if you have had preterm labor before. Contact a health care provider if:  You think you are going into preterm labor.  You have signs or symptoms of preterm labor.  You have symptoms of  infection. Get help right away if:  You are having regular, painful contractions every 5 minutes or less.  Your water breaks. Summary  Preterm labor is labor that starts before you reach 37 weeks of pregnancy.  Delivering your baby early increases your baby's risk of developing lifelong problems.  The exact cause of preterm labor is unknown. However, having an abnormal uterus, an STI (sexually transmitted infection), or vaginal bleeding during pregnancy increases your risk for preterm labor.  Keep all follow-up visits as told by your health care provider. This is important.  Contact a health care provider if you have signs or symptoms of preterm labor. This information is not intended to replace advice given to you by your health care provider. Make sure you discuss any questions you have with your health care provider. Document Revised: 01/17/2020 Document Reviewed: 01/17/2020 Elsevier Patient Education  2021 [employer].

## 2021-04-19 NOTE — MAU Note (Signed)
Cramping started 2 days ago.  Last night had a really sharp pain, she didn't know if that was normal or not.  Has been feeling the baby move for about a wk now.last felt was 2 days ago.

## 2021-04-20 DIAGNOSIS — Z369 Encounter for antenatal screening, unspecified: Secondary | ICD-10-CM

## 2021-04-20 DIAGNOSIS — Z3A19 19 weeks gestation of pregnancy: Secondary | ICD-10-CM

## 2021-04-20 DIAGNOSIS — O26892 Other specified pregnancy related conditions, second trimester: Secondary | ICD-10-CM | POA: Diagnosis not present

## 2021-04-20 DIAGNOSIS — R109 Unspecified abdominal pain: Secondary | ICD-10-CM | POA: Diagnosis not present

## 2021-04-20 DIAGNOSIS — O368120 Decreased fetal movements, second trimester, not applicable or unspecified: Secondary | ICD-10-CM | POA: Diagnosis not present

## 2021-04-22 LAB — GC/CHLAMYDIA PROBE AMP (~~LOC~~) NOT AT ARMC
Chlamydia: NEGATIVE
Comment: NEGATIVE
Comment: NORMAL
Neisseria Gonorrhea: NEGATIVE

## 2021-05-16 ENCOUNTER — Inpatient Hospital Stay (HOSPITAL_COMMUNITY): Payer: Commercial Managed Care - PPO

## 2021-05-16 ENCOUNTER — Inpatient Hospital Stay (HOSPITAL_BASED_OUTPATIENT_CLINIC_OR_DEPARTMENT_OTHER): Payer: Commercial Managed Care - PPO

## 2021-05-16 ENCOUNTER — Other Ambulatory Visit: Payer: Self-pay

## 2021-05-16 ENCOUNTER — Inpatient Hospital Stay (HOSPITAL_COMMUNITY): Admission: RE | Admit: 2021-05-16 | Payer: Commercial Managed Care - PPO | Source: Ambulatory Visit

## 2021-05-16 ENCOUNTER — Inpatient Hospital Stay (HOSPITAL_COMMUNITY)
Admission: AD | Admit: 2021-05-16 | Discharge: 2021-05-16 | Disposition: A | Discharge status: Home / Self Care | Payer: Commercial Managed Care - PPO | Attending: Obstetrics & Gynecology | Admitting: Obstetrics & Gynecology

## 2021-05-16 ENCOUNTER — Encounter (HOSPITAL_COMMUNITY): Payer: Self-pay | Admitting: Obstetrics & Gynecology

## 2021-05-16 DIAGNOSIS — O0932 Supervision of pregnancy with insufficient antenatal care, second trimester: Secondary | ICD-10-CM | POA: Diagnosis not present

## 2021-05-16 DIAGNOSIS — N1330 Unspecified hydronephrosis: Secondary | ICD-10-CM | POA: Insufficient documentation

## 2021-05-16 DIAGNOSIS — R109 Unspecified abdominal pain: Secondary | ICD-10-CM | POA: Insufficient documentation

## 2021-05-16 DIAGNOSIS — O26892 Other specified pregnancy related conditions, second trimester: Secondary | ICD-10-CM | POA: Insufficient documentation

## 2021-05-16 DIAGNOSIS — O99891 Other specified diseases and conditions complicating pregnancy: Secondary | ICD-10-CM | POA: Insufficient documentation

## 2021-05-16 DIAGNOSIS — O321XX0 Maternal care for breech presentation, not applicable or unspecified: Secondary | ICD-10-CM

## 2021-05-16 DIAGNOSIS — Z79899 Other long term (current) drug therapy: Secondary | ICD-10-CM | POA: Insufficient documentation

## 2021-05-16 DIAGNOSIS — O26832 Pregnancy related renal disease, second trimester: Secondary | ICD-10-CM

## 2021-05-16 DIAGNOSIS — Z3A23 23 weeks gestation of pregnancy: Secondary | ICD-10-CM | POA: Insufficient documentation

## 2021-05-16 DIAGNOSIS — K219 Gastro-esophageal reflux disease without esophagitis: Secondary | ICD-10-CM | POA: Insufficient documentation

## 2021-05-16 DIAGNOSIS — O99612 Diseases of the digestive system complicating pregnancy, second trimester: Secondary | ICD-10-CM | POA: Diagnosis not present

## 2021-05-16 DIAGNOSIS — R319 Hematuria, unspecified: Secondary | ICD-10-CM | POA: Insufficient documentation

## 2021-05-16 DIAGNOSIS — O26899 Other specified pregnancy related conditions, unspecified trimester: Secondary | ICD-10-CM

## 2021-05-16 LAB — URINALYSIS, ROUTINE W REFLEX MICROSCOPIC
Bilirubin Urine: NEGATIVE
Glucose, UA: NEGATIVE mg/dL
Ketones, ur: NEGATIVE mg/dL
Nitrite: NEGATIVE
Protein, ur: NEGATIVE mg/dL
RBC / HPF: >50 RBC/hpf — ABNORMAL HIGH (ref 0–5)
Specific Gravity, Urine: 1.025 (ref 1.005–1.030)
pH: 7 (ref 5.0–8.0)

## 2021-05-16 LAB — WET PREP, GENITAL
Clue Cells Wet Prep HPF POC: NONE SEEN
Sperm: NONE SEEN
Trich, Wet Prep: NONE SEEN
Yeast Wet Prep HPF POC: NONE SEEN

## 2021-05-16 MED ORDER — ACETAMINOPHEN 500 MG PO TABS
1000.0000 mg | ORAL_TABLET | Freq: Once | ORAL | Status: AC
  Administered 2021-05-16: 1000 mg via ORAL
  Filled 2021-05-16: qty 2

## 2021-05-16 NOTE — Discharge Instructions (Signed)
Abdominal Pain During Pregnancy Abdominal pain is common during pregnancy and has many possible causes. Some causes are more serious than others, and sometimes the cause is not known. Abdominal pain can be a sign that labor is starting. It can also be caused by normal growth of your baby causing stretching of muscles and ligaments during pregnancy. Always tell your health care provider if you have any abdominal pain. Follow these instructions at home:  Do not have sex or put anything in your vagina until your pain goes away completely.  Get plenty of rest until your pain improves.  Drink enough fluid to keep your urine pale yellow.  Take over-the-counter and prescription medicines only as told by your health care provider.  Keep all follow-up visits. This is important.   Contact a health care provider if:  Your pain continues or gets worse after resting.  You have lower abdominal pain that: ? Comes and goes at regular intervals. ? Spreads to your back. ? Is similar to menstrual cramps.  You have pain or burning when you urinate. Get help right away if:  You have a fever, chills, or shortness of breath.  You have vaginal bleeding.  You are leaking fluid or passing tissue from your vagina.  You have vomiting or diarrhea that lasts for more than 24 hours.  Your baby is moving less than usual.  You feel very weak or faint.  You develop severe pain in your upper abdomen. Summary  Abdominal pain is common during pregnancy and has many possible causes.  If you experience abdominal pain during pregnancy, tell your health care provider right away.  Follow your health care provider's home care instructions and keep all follow-up visits as told. This information is not intended to replace advice given to you by your health care provider. Make sure you discuss any questions you have with your health care provider. Document Revised: 08/28/2020 Document Reviewed: 08/28/2020 Elsevier  Patient Education  2021 Elsevier Inc.  

## 2021-05-16 NOTE — MAU Provider Note (Signed)
History     CSN: [id number]  Arrival date and time: 05/16/21 1004   Event Date/Time   First Provider Initiated Contact with Patient 05/16/21 1057      Chief Complaint  Patient presents with  . Abdominal Pain   HPI  [name] is a 30 y.o. female [id number] @ [redacted]w[redacted]d here in MAU with abdominal pain. She has very limited prenatal care with this pregnancy. She has a history of a 22 week IUFD; she was having contractions and came in and had the baby. She went on to have 3 full term babies. She reports having a subchorionic hemorrhage with the 22 week IUFD which was the cause of IUFD.   She reports lower abdominal pain and lower back pain. They pain started yesterday. She rates the pain 7/10. She has tried to take mylanta which did not help. She is not constipation. She reports having GERD throughout the pregnancy. No bleeding. + fetal movement.  No abdominal discharge.  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   She had sex within the last 24 hours.   OB History    Gravida  6   Para  4   Term  3   Preterm  1   AB  1   Living  3     SAB  1   IAB      Ectopic      Multiple  0   Live Births  3           Past Medical History:  Diagnosis Date  . Preterm labor     Past Surgical History:  Procedure Laterality Date  . CESAREAN SECTION N/A 07/09/2015   Procedure: CESAREAN SECTION;  Surgeon: Osborn Coho, MD;  Location: [facility];  Service: Obstetrics;  Laterality: N/A;  . WISDOM TOOTH EXTRACTION  2015    Family History  Problem Relation Age of Onset  . Diabetes Paternal Aunt   . Alcohol abuse Maternal Grandfather   . COPD Maternal Grandfather   . Diabetes Paternal Grandmother   . Healthy Mother   . Healthy Father   . Heart disease Paternal Uncle        dad's "brothers past away from heart attacks"  . Arthritis Neg Hx   . Asthma Neg Hx   . Birth defects Neg Hx   . Depression Neg Hx   . Drug abuse Neg Hx   . Early death Neg  Hx   . Hearing loss Neg Hx   . Hyperlipidemia Neg Hx   . Hypertension Neg Hx   . Kidney disease Neg Hx   . Learning disabilities Neg Hx   . Mental illness Neg Hx   . Mental retardation Neg Hx   . Miscarriages / Stillbirths Neg Hx   . Stroke Neg Hx   . Vision loss Neg Hx   . Varicose Veins Neg Hx     Social History   Tobacco Use  . Smoking status: Never Smoker  . Smokeless tobacco: Never Used  Vaping Use  . Vaping Use: Never used  Substance Use Topics  . Alcohol use: No  . Drug use: No    Allergies: No Known Allergies  Medications Prior to Admission  Medication Sig Dispense Refill Last Dose  . acetaminophen (TYLENOL) 500 MG tablet Take 500 mg by mouth every 6 (six) hours as needed for mild pain or headache.      . cyclobenzaprine (FLEXERIL) 10 MG tablet Take  10 mg by mouth 3 (three) times daily as needed for muscle spasms.      Marland Kitchen NIFEdipine (ADALAT CC) 30 MG 24 hr tablet Take 1 tablet (30 mg total) by mouth daily. 30 tablet 0   . Prenatal Vit-Fe Fumarate-FA (PRENATAL MULTIVITAMIN) TABS tablet Take 1 tablet by mouth daily at 12 noon.      Results for orders placed or performed during the hospital encounter of 05/16/21 (from the past 48 hour(s))  Urinalysis, Routine w reflex microscopic Urine, Clean Catch     Status: Abnormal   Collection Time: 05/16/21 10:35 AM  Result Value Ref Range   Color, Urine YELLOW YELLOW   APPearance HAZY (A) CLEAR   Specific Gravity, Urine 1.025 1.005 - 1.030   pH 7.0 5.0 - 8.0   Glucose, UA NEGATIVE NEGATIVE mg/dL   Hgb urine dipstick MODERATE (A) NEGATIVE   Bilirubin Urine NEGATIVE NEGATIVE   Ketones, ur NEGATIVE NEGATIVE mg/dL   Protein, ur NEGATIVE NEGATIVE mg/dL   Nitrite NEGATIVE NEGATIVE   Leukocytes,Ua TRACE (A) NEGATIVE   RBC / HPF >50 (H) 0 - 5 RBC/hpf   WBC, UA 21-50 0 - 5 WBC/hpf   Bacteria, UA FEW (A) NONE SEEN   Squamous Epithelial / LPF 0-5 0 - 5   Mucus PRESENT     Comment: Performed at [facility], [address], [place], [place] [address]  Wet prep, genital     Status: Abnormal   Collection Time: 05/16/21 11:09 AM  Result Value Ref Range   Yeast Wet Prep HPF POC NONE SEEN NONE SEEN   Trich, Wet Prep NONE SEEN NONE SEEN   Clue Cells Wet Prep HPF POC NONE SEEN NONE SEEN   WBC, Wet Prep HPF POC MANY (A) NONE SEEN   Sperm NONE SEEN     Comment: Performed at [facility], [address] [place], [place] [address]   US RENAL  Result Date: 05/16/2021 CLINICAL DATA:  Hematuria in pregnancy EXAM: RENAL / URINARY TRACT ULTRASOUND COMPLETE COMPARISON:  Ultrasound 04/19/2021 FINDINGS: Right Kidney: Renal measurements: 12.1 x 6.6 x 6.4 cm = volume: 269 mL. Echogenicity within normal limits. No mass, shadowing stone, or hydronephrosis visualized. Left Kidney: Renal measurements: 13.1 x 5.6 x 5.3 cm = volume: 205 mL. Echogenicity within normal limits. Moderate left hydronephrosis. No mass or shadowing stone visualized. Bladder: Appears normal for degree of bladder distention. Bilateral ureteral jets visualized. Other: None. IMPRESSION: Moderate left hydronephrosis. Electronically Signed   By: Duanne Guess D.O.   On: 05/16/2021 13:56   Review of Systems  Constitutional: Negative for fever.  Gastrointestinal: Positive for abdominal pain. Negative for diarrhea, nausea and vomiting.  Genitourinary: Positive for pelvic pain. Negative for dysuria, flank pain, frequency and urgency.  Musculoskeletal: Positive for back pain.   Physical Exam   Blood pressure 133/70, pulse 82, temperature 98.2 F (36.8 C), temperature source Oral, resp. rate 16, SpO2 100 %, unknown if currently breastfeeding.  Physical Exam Vitals and nursing note reviewed.  Constitutional:      General: She is not in acute distress.    Appearance: She is well-developed. She is obese. She is not ill-appearing, toxic-appearing or diaphoretic.  HENT:     Head: Normocephalic.  Abdominal:     Palpations: Abdomen is soft.     Tenderness:  There is abdominal tenderness in the suprapubic area. There is no right CVA tenderness, left CVA tenderness, guarding or rebound.  Genitourinary:    Comments:  Cervix: closed, thick, posterior.  Exam by: Venia Carbon, NP Skin:    General: Skin is warm.  Neurological:     Mental Status: She is alert and oriented to person, place, and time.  Psychiatric:        Mood and Affect: Mood normal.    Fetal Tracing: Baseline: 140 bpm Variability: moderate  Accelerations:  10x10 Decelerations: done  Toco: quiet  MAU Course  Procedures  MDM  Tylenol 1 gram given PO> pain down from 7/10 to 3/10 Hematuria noted in urine > renal ultrasound and Urine culture (pending) Unable to collect FFN d/t sex within the last 24 hours. Cervix was closed on exam.  Limited US shows cervical length > 5 cm  Assessment and Plan   A:  1. Hydronephrosis of left kidney   2. Abdominal pain in pregnancy   3. [redacted] weeks gestation of pregnancy   4. Hematuria     P:  Discharge home in stable condition Limited prenatal care- spoke with Dr. Mora Appl over the phone regarding f/u. Dr. Mora Appl will have her office call the patient Strict MAU return precautions Ok to use tylenol OTC as directed on the bottle Urine culture pending.   Duane Lope, NP 05/16/2021 2:55 PM

## 2021-05-16 NOTE — MAU Note (Signed)
Tina [name] is a 30 y.o. at [redacted]w[redacted]d here in MAU reporting: intermittent cramping since last night, states it is worse today. Pain is random in frequency. Denies bleeding or LOF.  Onset of complaint: last night  Pain score: 7/10  Vitals:   05/16/21 1031  BP: 126/66  Pulse: 79  Resp: 16  Temp: 98.2 F (36.8 C)  SpO2: 100%     FHT: EFM applied in room, 148  Lab orders placed from triage: UA

## 2021-05-17 LAB — GC/CHLAMYDIA PROBE AMP (~~LOC~~) NOT AT ARMC
Chlamydia: NEGATIVE
Comment: NEGATIVE
Comment: NORMAL
Neisseria Gonorrhea: NEGATIVE

## 2021-05-18 ENCOUNTER — Other Ambulatory Visit: Payer: Self-pay

## 2021-05-18 ENCOUNTER — Encounter (HOSPITAL_COMMUNITY): Payer: Self-pay | Admitting: Obstetrics & Gynecology

## 2021-05-18 ENCOUNTER — Inpatient Hospital Stay (HOSPITAL_COMMUNITY)
Admission: AD | Admit: 2021-05-18 | Discharge: 2021-05-18 | Disposition: A | Discharge status: Home / Self Care | Payer: Commercial Managed Care - PPO | Attending: Obstetrics & Gynecology | Admitting: Obstetrics & Gynecology

## 2021-05-18 DIAGNOSIS — O2342 Unspecified infection of urinary tract in pregnancy, second trimester: Secondary | ICD-10-CM

## 2021-05-18 DIAGNOSIS — Z3A24 24 weeks gestation of pregnancy: Secondary | ICD-10-CM | POA: Diagnosis not present

## 2021-05-18 LAB — CULTURE, OB URINE: Culture: 30000 — AB

## 2021-05-18 MED ORDER — CEFADROXIL 500 MG PO CAPS: 500.0000 mg | ORAL_CAPSULE | Freq: Two times a day (BID) | ORAL | 0 refills | Status: AC

## 2021-05-18 NOTE — MAU Note (Signed)
Presents with c/o rectal pressure that began @ 0600 this morning.  Denies VB.  Endorses +FM.

## 2021-05-18 NOTE — Discharge Instructions (Signed)
You have a urinary tract infection that needs antibiotic treatment. You have a prescription at your pharmacy waiting for you to pick up. Please take all of the medication as directed.

## 2021-05-18 NOTE — MAU Provider Note (Signed)
History     CSN: [id number]  Arrival date and time: 05/18/21 1034   Event Date/Time   First Provider Initiated Contact with Patient 05/18/21 1155      Chief Complaint  Patient presents with  . Rectal Pressure   Ms. Genise Strack is a 30 y.o. year old [phone] female at [redacted]w[redacted]d weeks gestation who presents to MAU reporting rectal pressure since 0600 this morning. She denies VB. She reports (+) FM. She was seen in MAU on 05/16/2021 where she had a UCx done. The UCx has 30K E.Coli; no treatment has been initiated yet. She is a patient of [facility]; limited PNC.   OB History    Gravida  6   Para  4   Term  3   Preterm  1   AB  1   Living  3     SAB  1   IAB      Ectopic      Multiple  0   Live Births  3           Past Medical History:  Diagnosis Date  . Preterm labor     Past Surgical History:  Procedure Laterality Date  . CESAREAN SECTION N/A 07/09/2015   Procedure: CESAREAN SECTION;  Surgeon: Osborn Coho, MD;  Location: [facility];  Service: Obstetrics;  Laterality: N/A;  . WISDOM TOOTH EXTRACTION  2015    Family History  Problem Relation Age of Onset  . Diabetes Paternal Aunt   . Alcohol abuse Maternal Grandfather   . COPD Maternal Grandfather   . Diabetes Paternal Grandmother   . Healthy Mother   . Healthy Father   . Heart disease Paternal Uncle        dad's "brothers past away from heart attacks"  . Arthritis Neg Hx   . Asthma Neg Hx   . Birth defects Neg Hx   . Depression Neg Hx   . Drug abuse Neg Hx   . Early death Neg Hx   . Hearing loss Neg Hx   . Hyperlipidemia Neg Hx   . Hypertension Neg Hx   . Kidney disease Neg Hx   . Learning disabilities Neg Hx   . Mental illness Neg Hx   . Mental retardation Neg Hx   . Miscarriages / Stillbirths Neg Hx   . Stroke Neg Hx   . Vision loss Neg Hx   . Varicose Veins Neg Hx     Social History   Tobacco Use  . Smoking status: Never Smoker  . Smokeless tobacco: Never Used  Vaping Use  . Vaping  Use: Never used  Substance Use Topics  . Alcohol use: No  . Drug use: No    Allergies: No Known Allergies  Medications Prior to Admission  Medication Sig Dispense Refill Last Dose  . Prenatal Vit-Fe Fumarate-FA (PRENATAL MULTIVITAMIN) TABS tablet Take 1 tablet by mouth daily at 12 noon.   05/18/2021 at Unknown time  . acetaminophen (TYLENOL) 500 MG tablet Take 500 mg by mouth every 6 (six) hours as needed for mild pain or headache.      . cyclobenzaprine (FLEXERIL) 10 MG tablet Take 10 mg by mouth 3 (three) times daily as needed for muscle spasms.      Marland Kitchen NIFEdipine (ADALAT CC) 30 MG 24 hr tablet Take 1 tablet (30 mg total) by mouth daily. 30 tablet 0     Review of Systems  Constitutional: Negative.   HENT: Negative.   Eyes: Negative.  Respiratory: Negative.   Cardiovascular: Negative.   Gastrointestinal: Negative.   Endocrine: Negative.   Genitourinary: Positive for pelvic pain (that started at 0600).  Musculoskeletal: Negative.   Skin: Negative.   Allergic/Immunologic: Negative.   Neurological: Negative.   Hematological: Negative.   Psychiatric/Behavioral: Negative.    Physical Exam   Blood pressure 126/73, pulse 91, temperature 98.1 F (36.7 C), temperature source Oral, resp. rate 18, height 5\' 2"  (1.575 m), weight 103.6 kg, SpO2 100 %, unknown if currently breastfeeding.  Physical Exam Vitals and nursing note reviewed. Exam conducted with a chaperone present.  Constitutional:      Appearance: Normal appearance. She is obese.  Genitourinary:    Comments: Dilation: Closed Effacement (%): Thick Cervical Position: Posterior Presentation: Undeterminable Exam by: , CNM Musculoskeletal:        General: Normal range of motion.  Skin:    General: Skin is warm and dry.  Neurological:     Mental Status: She is alert and oriented to person, place, and time.  Psychiatric:        Mood and Affect: Mood normal.        Behavior: Behavior normal.        Thought  Content: Thought content normal.        Judgment: Judgment normal.    REASSURING NST - FHR: 145 bpm / moderate variability / accels present / decels absent / TOCO: none MAU Course  Procedures  MDM NST Cervical exam   Assessment and Plan  UTI (urinary tract infection) during pregnancy, second trimester - Rx for Cefadroxil 500 mg BID x 10 days sent - Information provided on UTI   [redacted] weeks gestation of pregnancy  - Discharge patient - Keep scheduled appts with [facility] - Patient verbalized an understanding of the plan of care and agrees.     Carloyn Jaeger, CNM 05/18/2021, 11:55 AM

## 2021-05-29 DIAGNOSIS — Z3A25 25 weeks gestation of pregnancy: Secondary | ICD-10-CM | POA: Diagnosis not present

## 2021-05-29 DIAGNOSIS — O99212 Obesity complicating pregnancy, second trimester: Secondary | ICD-10-CM | POA: Diagnosis not present

## 2021-05-29 DIAGNOSIS — Z8751 Personal history of pre-term labor: Secondary | ICD-10-CM | POA: Diagnosis not present

## 2021-05-29 DIAGNOSIS — Z363 Encounter for antenatal screening for malformations: Secondary | ICD-10-CM | POA: Diagnosis not present

## 2021-06-25 DIAGNOSIS — Z3A29 29 weeks gestation of pregnancy: Secondary | ICD-10-CM | POA: Diagnosis not present

## 2021-06-25 DIAGNOSIS — Z362 Encounter for other antenatal screening follow-up: Secondary | ICD-10-CM | POA: Diagnosis not present

## 2021-07-25 DIAGNOSIS — Z3A33 33 weeks gestation of pregnancy: Secondary | ICD-10-CM | POA: Diagnosis not present

## 2021-07-25 DIAGNOSIS — O365999 Maternal care for other known or suspected poor fetal growth, unspecified trimester, other fetus: Secondary | ICD-10-CM | POA: Diagnosis not present

## 2021-08-06 DIAGNOSIS — O99213 Obesity complicating pregnancy, third trimester: Secondary | ICD-10-CM | POA: Diagnosis not present

## 2021-08-06 DIAGNOSIS — Z3A35 35 weeks gestation of pregnancy: Secondary | ICD-10-CM | POA: Diagnosis not present

## 2021-08-06 LAB — OB RESULTS CONSOLE GBS: GBS: NEGATIVE

## 2021-08-13 DIAGNOSIS — A609 Anogenital herpesviral infection, unspecified: Secondary | ICD-10-CM | POA: Diagnosis not present

## 2021-08-13 DIAGNOSIS — N883 Incompetence of cervix uteri: Secondary | ICD-10-CM | POA: Diagnosis not present

## 2021-08-13 DIAGNOSIS — Z369 Encounter for antenatal screening, unspecified: Secondary | ICD-10-CM | POA: Diagnosis not present

## 2021-08-13 DIAGNOSIS — Z331 Pregnant state, incidental: Secondary | ICD-10-CM | POA: Diagnosis not present

## 2021-08-13 DIAGNOSIS — Z3493 Encounter for supervision of normal pregnancy, unspecified, third trimester: Secondary | ICD-10-CM | POA: Diagnosis not present

## 2021-08-13 DIAGNOSIS — Z8751 Personal history of pre-term labor: Secondary | ICD-10-CM | POA: Diagnosis not present

## 2021-08-13 DIAGNOSIS — Z3A36 36 weeks gestation of pregnancy: Secondary | ICD-10-CM | POA: Diagnosis not present

## 2021-08-13 DIAGNOSIS — O99213 Obesity complicating pregnancy, third trimester: Secondary | ICD-10-CM | POA: Diagnosis not present

## 2021-08-16 ENCOUNTER — Other Ambulatory Visit: Payer: Self-pay | Admitting: Obstetrics and Gynecology

## 2021-08-27 DIAGNOSIS — Z3A38 38 weeks gestation of pregnancy: Secondary | ICD-10-CM | POA: Diagnosis not present

## 2021-08-27 DIAGNOSIS — O99213 Obesity complicating pregnancy, third trimester: Secondary | ICD-10-CM | POA: Diagnosis not present

## 2021-09-03 DIAGNOSIS — O99213 Obesity complicating pregnancy, third trimester: Secondary | ICD-10-CM | POA: Diagnosis not present

## 2021-09-03 DIAGNOSIS — Z3A39 39 weeks gestation of pregnancy: Secondary | ICD-10-CM | POA: Diagnosis not present

## 2021-09-05 ENCOUNTER — Telehealth (HOSPITAL_COMMUNITY): Payer: Self-pay | Admitting: *Deleted

## 2021-09-05 ENCOUNTER — Other Ambulatory Visit: Payer: Self-pay | Admitting: Obstetrics and Gynecology

## 2021-09-05 ENCOUNTER — Encounter (HOSPITAL_COMMUNITY): Payer: Self-pay | Admitting: *Deleted

## 2021-09-05 ENCOUNTER — Encounter (HOSPITAL_COMMUNITY): Payer: Self-pay

## 2021-09-05 NOTE — Telephone Encounter (Signed)
Preadmission screen  

## 2021-09-08 ENCOUNTER — Inpatient Hospital Stay (HOSPITAL_COMMUNITY): Payer: Commercial Managed Care - PPO | Admitting: Anesthesiology

## 2021-09-08 ENCOUNTER — Other Ambulatory Visit: Payer: Self-pay

## 2021-09-08 ENCOUNTER — Inpatient Hospital Stay (HOSPITAL_COMMUNITY)
Admission: AD | Admit: 2021-09-08 | Discharge: 2021-09-10 | DRG: 806 | Disposition: A | Discharge status: Home / Self Care | Payer: Commercial Managed Care - PPO | Attending: Obstetrics and Gynecology | Admitting: Obstetrics and Gynecology

## 2021-09-08 ENCOUNTER — Encounter (HOSPITAL_COMMUNITY): Payer: Self-pay | Admitting: Obstetrics and Gynecology

## 2021-09-08 DIAGNOSIS — O34219 Maternal care for unspecified type scar from previous cesarean delivery: Secondary | ICD-10-CM | POA: Diagnosis not present

## 2021-09-08 DIAGNOSIS — A6000 Herpesviral infection of urogenital system, unspecified: Secondary | ICD-10-CM | POA: Diagnosis present

## 2021-09-08 DIAGNOSIS — Z20822 Contact with and (suspected) exposure to covid-19: Secondary | ICD-10-CM | POA: Diagnosis not present

## 2021-09-08 DIAGNOSIS — Z3A40 40 weeks gestation of pregnancy: Secondary | ICD-10-CM | POA: Diagnosis not present

## 2021-09-08 DIAGNOSIS — O9832 Other infections with a predominantly sexual mode of transmission complicating childbirth: Secondary | ICD-10-CM | POA: Diagnosis present

## 2021-09-08 DIAGNOSIS — O26893 Other specified pregnancy related conditions, third trimester: Secondary | ICD-10-CM | POA: Diagnosis not present

## 2021-09-08 DIAGNOSIS — Z6841 Body Mass Index (BMI) 40.0 and over, adult: Secondary | ICD-10-CM

## 2021-09-08 LAB — RESP PANEL BY RT-PCR (FLU A&B, COVID) ARPGX2
Influenza A by PCR: NEGATIVE
Influenza B by PCR: NEGATIVE
SARS Coronavirus 2 by RT PCR: NEGATIVE

## 2021-09-08 LAB — CBC
HCT: 41 % (ref 36.0–46.0)
Hemoglobin: 13.8 g/dL (ref 12.0–15.0)
MCH: 31.8 pg (ref 26.0–34.0)
MCHC: 33.7 g/dL (ref 30.0–36.0)
MCV: 94.5 fL (ref 80.0–100.0)
Platelets: 205 10*3/uL (ref 150–400)
RBC: 4.34 MIL/uL (ref 3.87–5.11)
RDW: 13.6 % (ref 11.5–15.5)
WBC: 11.5 10*3/uL — ABNORMAL HIGH (ref 4.0–10.5)
nRBC: 0 % (ref 0.0–0.2)

## 2021-09-08 LAB — TYPE AND SCREEN
ABO/RH(D): O POS
Antibody Screen: NEGATIVE

## 2021-09-08 LAB — RPR: RPR Ser Ql: NONREACTIVE

## 2021-09-08 MED ORDER — IBUPROFEN 600 MG PO TABS
600.0000 mg | ORAL_TABLET | Freq: Four times a day (QID) | ORAL | Status: DC
  Administered 2021-09-08 – 2021-09-10 (×9): 600 mg via ORAL
  Filled 2021-09-08 (×10): qty 1

## 2021-09-08 MED ORDER — SOD CITRATE-CITRIC ACID 500-334 MG/5ML PO SOLN: 30.0000 mL | ORAL | Status: DC | PRN

## 2021-09-08 MED ORDER — LACTATED RINGERS IV SOLN
500.0000 mL | Freq: Once | INTRAVENOUS | Status: AC
  Administered 2021-09-08: 500 mL via INTRAVENOUS

## 2021-09-08 MED ORDER — OXYCODONE-ACETAMINOPHEN 5-325 MG PO TABS: 2.0000 | ORAL_TABLET | ORAL | Status: DC | PRN

## 2021-09-08 MED ORDER — OXYCODONE-ACETAMINOPHEN 5-325 MG PO TABS: 1.0000 | ORAL_TABLET | ORAL | Status: DC | PRN

## 2021-09-08 MED ORDER — DIPHENHYDRAMINE HCL 25 MG PO CAPS: 25.0000 mg | ORAL_CAPSULE | Freq: Four times a day (QID) | ORAL | Status: DC | PRN

## 2021-09-08 MED ORDER — EPHEDRINE 5 MG/ML INJ: 10.0000 mg | INTRAVENOUS | Status: DC | PRN

## 2021-09-08 MED ORDER — ONDANSETRON HCL 4 MG PO TABS: 4.0000 mg | ORAL_TABLET | ORAL | Status: DC | PRN

## 2021-09-08 MED ORDER — ZOLPIDEM TARTRATE 5 MG PO TABS: 5.0000 mg | ORAL_TABLET | Freq: Every evening | ORAL | Status: DC | PRN

## 2021-09-08 MED ORDER — WITCH HAZEL-GLYCERIN EX PADS: 1.0000 "application " | MEDICATED_PAD | CUTANEOUS | Status: DC | PRN

## 2021-09-08 MED ORDER — PHENYLEPHRINE 40 MCG/ML (10ML) SYRINGE FOR IV PUSH (FOR BLOOD PRESSURE SUPPORT)
80.0000 ug | PREFILLED_SYRINGE | INTRAVENOUS | Status: DC | PRN
  Administered 2021-09-08 (×2): 80 ug via INTRAVENOUS

## 2021-09-08 MED ORDER — FLEET ENEMA 7-19 GM/118ML RE ENEM: 1.0000 | ENEMA | RECTAL | Status: DC | PRN

## 2021-09-08 MED ORDER — LIDOCAINE HCL (PF) 1 % IJ SOLN
INTRAMUSCULAR | Status: DC | PRN
  Administered 2021-09-08: 8 mL via EPIDURAL

## 2021-09-08 MED ORDER — FENTANYL-BUPIVACAINE-NACL 0.5-0.125-0.9 MG/250ML-% EP SOLN
12.0000 mL/h | EPIDURAL | Status: DC | PRN
  Administered 2021-09-08: 12 mL/h via EPIDURAL

## 2021-09-08 MED ORDER — ACETAMINOPHEN 325 MG PO TABS: 650.0000 mg | ORAL_TABLET | ORAL | Status: DC | PRN

## 2021-09-08 MED ORDER — TETANUS-DIPHTH-ACELL PERTUSSIS 5-2.5-18.5 LF-MCG/0.5 IM SUSY: 0.5000 mL | PREFILLED_SYRINGE | Freq: Once | INTRAMUSCULAR | Status: DC

## 2021-09-08 MED ORDER — OXYTOCIN-SODIUM CHLORIDE 30-0.9 UT/500ML-% IV SOLN
2.5000 [IU]/h | INTRAVENOUS | Status: DC
  Administered 2021-09-08: 2.5 [IU]/h via INTRAVENOUS
  Filled 2021-09-08: qty 500

## 2021-09-08 MED ORDER — COCONUT OIL OIL: 1.0000 "application " | TOPICAL_OIL | Status: DC | PRN

## 2021-09-08 MED ORDER — DIPHENHYDRAMINE HCL 50 MG/ML IJ SOLN: 12.5000 mg | INTRAMUSCULAR | Status: DC | PRN

## 2021-09-08 MED ORDER — LIDOCAINE HCL (PF) 1 % IJ SOLN: 30.0000 mL | INTRAMUSCULAR | Status: DC | PRN

## 2021-09-08 MED ORDER — FENTANYL-BUPIVACAINE-NACL 0.5-0.125-0.9 MG/250ML-% EP SOLN: 12.0000 mL/h | EPIDURAL | Status: DC | PRN

## 2021-09-08 MED ORDER — SENNOSIDES-DOCUSATE SODIUM 8.6-50 MG PO TABS
2.0000 | ORAL_TABLET | Freq: Every day | ORAL | Status: DC
  Administered 2021-09-09 – 2021-09-10 (×2): 2 via ORAL
  Filled 2021-09-08 (×2): qty 2

## 2021-09-08 MED ORDER — OXYCODONE-ACETAMINOPHEN 5-325 MG PO TABS
1.0000 | ORAL_TABLET | ORAL | Status: DC | PRN
  Administered 2021-09-08 – 2021-09-10 (×5): 1 via ORAL
  Filled 2021-09-08 (×5): qty 1

## 2021-09-08 MED ORDER — PRENATAL MULTIVITAMIN CH
1.0000 | ORAL_TABLET | Freq: Every day | ORAL | Status: DC
  Administered 2021-09-08 – 2021-09-10 (×3): 1 via ORAL
  Filled 2021-09-08 (×3): qty 1

## 2021-09-08 MED ORDER — SIMETHICONE 80 MG PO CHEW: 80.0000 mg | CHEWABLE_TABLET | ORAL | Status: DC | PRN

## 2021-09-08 MED ORDER — LACTATED RINGERS IV SOLN
500.0000 mL | INTRAVENOUS | Status: DC | PRN
  Administered 2021-09-08: 500 mL via INTRAVENOUS

## 2021-09-08 MED ORDER — OXYTOCIN BOLUS FROM INFUSION
333.0000 mL | Freq: Once | INTRAVENOUS | Status: AC
  Administered 2021-09-08: 333 mL via INTRAVENOUS

## 2021-09-08 MED ORDER — EPHEDRINE 5 MG/ML INJ
10.0000 mg | INTRAVENOUS | Status: DC | PRN
Start: 2021-09-08 — End: 2021-09-08

## 2021-09-08 MED ORDER — PHENYLEPHRINE 40 MCG/ML (10ML) SYRINGE FOR IV PUSH (FOR BLOOD PRESSURE SUPPORT)
80.0000 ug | PREFILLED_SYRINGE | INTRAVENOUS | Status: DC | PRN
  Filled 2021-09-08: qty 10

## 2021-09-08 MED ORDER — ACETAMINOPHEN 325 MG PO TABS
650.0000 mg | ORAL_TABLET | ORAL | Status: DC | PRN
  Administered 2021-09-08: 650 mg via ORAL
  Filled 2021-09-08: qty 2

## 2021-09-08 MED ORDER — DIBUCAINE (PERIANAL) 1 % EX OINT: 1.0000 "application " | TOPICAL_OINTMENT | CUTANEOUS | Status: DC | PRN

## 2021-09-08 MED ORDER — BENZOCAINE-MENTHOL 20-0.5 % EX AERO: 1.0000 "application " | INHALATION_SPRAY | CUTANEOUS | Status: DC | PRN

## 2021-09-08 MED ORDER — LACTATED RINGERS IV SOLN: INTRAVENOUS | Status: DC

## 2021-09-08 MED ORDER — ONDANSETRON HCL 4 MG/2ML IJ SOLN: 4.0000 mg | INTRAMUSCULAR | Status: DC | PRN

## 2021-09-08 MED ORDER — FENTANYL-BUPIVACAINE-NACL 0.5-0.125-0.9 MG/250ML-% EP SOLN
EPIDURAL | Status: AC
  Filled 2021-09-08: qty 250

## 2021-09-08 MED ORDER — ONDANSETRON HCL 4 MG/2ML IJ SOLN: 4.0000 mg | Freq: Four times a day (QID) | INTRAMUSCULAR | Status: DC | PRN

## 2021-09-08 NOTE — Anesthesia Procedure Notes (Signed)
Epidural Patient location during procedure: OB Start time: 09/08/2021 6:44 AM End time: 09/08/2021 6:50 AM  Staffing Anesthesiologist: Bethena Midget, MD  Preanesthetic Checklist Completed: patient identified, IV checked, site marked, risks and benefits discussed, surgical consent, monitors and equipment checked, pre-op evaluation and timeout performed  Epidural Patient position: sitting Prep: DuraPrep and site prepped and draped Patient monitoring: continuous pulse ox and blood pressure Approach: midline Location: L3-L4 Injection technique: LOR air  Needle:  Needle type: Tuohy  Needle gauge: 17 G Needle length: 9 cm and 9 Needle insertion depth: 7 cm Catheter type: closed end flexible Catheter size: 19 Gauge Catheter at skin depth: 13 cm Test dose: negative  Assessment Events: blood not aspirated, injection not painful, no injection resistance, no paresthesia and negative IV test

## 2021-09-08 NOTE — Anesthesia Preprocedure Evaluation (Signed)
Anesthesia Evaluation    Airway Mallampati: II  TM Distance: >3 FB Neck ROM: Full    Dental no notable dental hx. (+) Teeth Intact, Dental Advisory Given   Pulmonary    Pulmonary exam normal breath sounds clear to auscultation       Cardiovascular Normal cardiovascular exam Rhythm:Regular Rate:Normal     Neuro/Psych    GI/Hepatic   Endo/Other    Renal/GU      Musculoskeletal   Abdominal   Peds  Hematology   Anesthesia Other Findings   Reproductive/Obstetrics                             Anesthesia Physical Anesthesia Plan  ASA: 3  Anesthesia Plan: Epidural   Post-op Pain Management:    Induction:   PONV Risk Score and Plan:   Airway Management Planned: Natural Airway  Additional Equipment: None  Intra-op Plan:   Post-operative Plan:   Informed Consent: I have reviewed the patients History and Physical, chart, labs and discussed the procedure including the risks, benefits and alternatives for the proposed anesthesia with the patient or authorized representative who has indicated his/her understanding and acceptance.     Dental Advisory Given  Plan Discussed with: Anesthesiologist  Anesthesia Plan Comments: (Labs checked- platelets confirmed with RN in room. Fetal heart tracing, per RN, reported to be stable enough for sitting procedure. Discussed epidural, and patient consents to the procedure:  included risk of possible headache,backache, failed block, allergic reaction, and nerve injury. This patient was asked if she had any questions or concerns before the procedure started.)        Anesthesia Quick Evaluation

## 2021-09-08 NOTE — H&P (Signed)
OB ADMISSION/ HISTORY & PHYSICAL:  Admission Date: 09/08/2021  1:29 AM  Admit Diagnosis: Normal labor  Tina [name] is a 30 y.o. female [id number] [redacted]w[redacted]d presenting for contractions. Endorses active FM, denies LOF and vaginal bleeding. Ctxs started at 9pm.  History of current pregnancy: [id number]   Primary OB Provider: [facility] Patient entered care with [facility] at 40.1 wks.   EDC 09/07/21 by [place] @ 13.3 weeks. LMP 12/14/20. Anatomy scan:  29.3 wks, complete w/ anterior placenta.   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   Preterm delivery--2015, 22 weeks, iufd 07/08/2015    Prenatal Labs: ABO, Rh: --/--/O POS (09/11 8242) Antibody: NEG (09/11 0508) Rubella: Immune (03/08 0000)  RPR: Nonreactive (03/08 0000)  HBsAg: Negative (03/08 0000)  HIV: Non-reactive (03/08 0000)  GTT: 92 GBS: Negative/-- (08/09 0000)  GC/CHL: Negative/ Negative Genetics: Declines Tdap/influenza vaccines: UTD   OB History  Gravida Para Term Preterm AB Living  6 4 3 1 1 3   SAB IAB Ectopic Multiple Live Births  1     0 3    # Outcome Date GA Lbr Len/2nd Weight Sex Delivery Anes PTL Lv  6 Current           5 Term 03/02/20 [redacted]w[redacted]d 04:49 / 00:05 3391 g F Vag-Spont EPI  LIV  4 Term 10/21/16 [redacted]w[redacted]d 12:32 / 00:18 3045 g F VBAC EPI, Local  LIV  3 Term 07/09/15 [redacted]w[redacted]d  2610 g M CS-LVertical EPI  LIV  2 Preterm 03/23/14 [redacted]w[redacted]d       FD  1 SAB 2015 [redacted]w[redacted]d           Medical / Surgical History: Past medical history:  Past Medical History:  Diagnosis Date   Preterm labor      Past surgical history:  Past Surgical History:  Procedure Laterality Date   CESAREAN SECTION N/A 07/09/2015   Procedure: CESAREAN SECTION;  Surgeon: 09/09/2015, MD;  Location: [facility];  Service: Obstetrics;  Laterality: N/A;   WISDOM TOOTH EXTRACTION  2015   Family History:  Family History  Problem Relation Age of Onset   Diabetes Paternal Aunt    Alcohol abuse Maternal Grandfather    COPD Maternal Grandfather    Diabetes Paternal Grandmother    Healthy Mother    Healthy Father    Heart disease Paternal Uncle        dad's "brothers past away from heart attacks"   Arthritis Neg Hx    Asthma Neg Hx    Birth defects Neg Hx    Depression Neg Hx    Drug abuse Neg Hx    Early death Neg Hx    Hearing loss Neg Hx    Hyperlipidemia Neg Hx    Hypertension Neg Hx    Kidney disease Neg Hx    Learning disabilities Neg Hx    Mental illness Neg Hx    Mental retardation Neg Hx    Miscarriages / Stillbirths Neg Hx    Stroke Neg Hx    Vision loss Neg Hx  Varicose Veins Neg Hx     Social History:  reports that she has never smoked. She has never used smokeless tobacco. She reports that she does not drink alcohol and does not use drugs.  Allergies: Patient has no known allergies.   Current Medications at time of admission:  Prior to Admission medications   Medication Sig Start Date End Date Taking? Authorizing Provider  acetaminophen (TYLENOL) 500 MG tablet Take 500 mg by mouth every 6 (six) hours as needed for mild pain or headache.    Yes [provider]  cyclobenzaprine (FLEXERIL) 10 MG tablet Take 10 mg by mouth 3 (three) times daily as needed for muscle spasms.  02/24/20  Yes [provider]  Prenatal Vit-Fe Fumarate-FA (PRENATAL MULTIVITAMIN) TABS tablet Take 1 tablet by mouth daily at 12 noon.   Yes [provider]  NIFEdipine (ADALAT CC) 30 MG 24 hr tablet Take 1 tablet (30 mg total) by mouth daily. 03/04/20   Dale [place], FNP    Review of  Systems: Constitutional: Negative   HENT: Negative   Eyes: Negative   Respiratory: Negative   Cardiovascular: Negative   Gastrointestinal: Negative  Genitourinary: negative for bloody show, negative for LOF   Musculoskeletal: Negative   Skin: Negative   Neurological: Negative   Endo/Heme/Allergies: Negative   Psychiatric/Behavioral: Negative    Physical Exam: VS: Blood pressure 123/77, pulse 79, temperature 98.1 F (36.7 C), temperature source Oral, resp. rate 18, height 5\' 2"  (1.575 m), weight 109.8 kg, SpO2 100 %, unknown if currently breastfeeding. AAO x3, no signs of distress Cardiovascular: RRR Respiratory: Lung fields clear to ausculation GU/GI: Abdomen gravid, non-tender, non-distended, active FM, vertex,  Extremities: negatve edema, negative for pain, tenderness, and cords  Cervical exam:Dilation: 4 Effacement (%): 90 Station: -2 Exam by:: [email or web address], RN FHR: baseline rate 130 / variability minimal to moderate / accelerations present / absent decelerations TOCO: 2-4   Prenatal Transfer Tool  Maternal Diabetes: No Genetic Screening: Declined Maternal Ultrasounds/Referrals: Normal Fetal Ultrasounds or other Referrals:  None Maternal Substance Abuse:  No Significant Maternal Medications:  Meds include: Other: valtrex Significant Maternal Lab Results: Group B Strep negative    Assessment: 30 y.o. 38 [redacted]w[redacted]d admitted for latent labor. TOLAC. Successful VBAC x2 after c/section in 2016 for NRFHR.  Latent stage of labor FHR category 1 GBS negative Pain management plan: epidural   Plan:  Admit to L&D Routine admission orders Epidural PRN   Dr 2017 notified of admission and plan of care  Richardson Dopp MSN, CNM 09/08/2021 6:21 AM

## 2021-09-08 NOTE — Plan of Care (Signed)

## 2021-09-08 NOTE — Lactation Note (Signed)
This note was copied from a baby's chart. Lactation Consultation Note  Patient Name: Tina [name] YFVCB'S Date: 09/08/2021 Reason for consult: Initial assessment;Term Age:30 years  Mom states she plans to BF in the hospital and then bottle feed formula after DC.  Mom had issues with previous child taking a bottle when she returned to work.  She also felt pumping and breastfeeding were a great deal of work.    Mom did request assistance with feed while LC was in the room.  Infant was placed STS with mom and became spitting with trying to latch.  Infant had some clear mucous emesis but then became very gaggy without spitting up.  LC called out for bulb syringe.  Infant cried a few times between trying to spit up.  Infant began crying and LC placed infant on mom's chest then baby immediately fell asleep..  No color change noted.  LC offered to come back when infant began showing cues if mom desired latch assist.    Mom has brochure and is aware of OP LC appt., BFSG, and phone line.    Maternal Data Has patient been taught Hand Expression?: Yes Does the patient have breastfeeding experience prior to this delivery?: Yes How long did the patient breastfeed?: 1 year with one, 9 months with one, 6 months with another child  Feeding Mother's Current Feeding Choice: Breast Milk  LATCH Score Latch: Too sleepy or reluctant, no latch achieved, no sucking elicited.  Audible Swallowing: None  Type of Nipple: Everted at rest and after stimulation  Comfort (Breast/Nipple): Soft / non-tender  Hold (Positioning): Assistance needed to correctly position infant at breast and maintain latch.  LATCH Score: 5   Lactation Tools Discussed/Used    Interventions Interventions: Breast feeding basics reviewed;Assisted with latch;Hand express;Education;Hand pump (pt. requested hand pump and is familiar with them:LC did not remove it from box to assess correct flange size during this  visit)  Discharge Pump: Manual  Consult Status Consult Status: Follow-up Date: 09/09/21 Follow-up type: In-patient    Maryruth [facility] 09/08/2021, 1:58 PM

## 2021-09-08 NOTE — MAU Note (Signed)
2 cm at last office visit.  Contractions since 9 pm, 5-6 min apart.  No leaking. No bleeding. Baby moving well.

## 2021-09-08 NOTE — MAU Note (Signed)
Pt requesting an epidural. Report called to LD-transferred to 207 via wheelchair

## 2021-09-09 DIAGNOSIS — O34219 Maternal care for unspecified type scar from previous cesarean delivery: Secondary | ICD-10-CM | POA: Diagnosis not present

## 2021-09-09 LAB — CBC
HCT: 34.6 % — ABNORMAL LOW (ref 36.0–46.0)
Hemoglobin: 11.7 g/dL — ABNORMAL LOW (ref 12.0–15.0)
MCH: 32.1 pg (ref 26.0–34.0)
MCHC: 33.8 g/dL (ref 30.0–36.0)
MCV: 94.8 fL (ref 80.0–100.0)
Platelets: 169 10*3/uL (ref 150–400)
RBC: 3.65 MIL/uL — ABNORMAL LOW (ref 3.87–5.11)
RDW: 13.7 % (ref 11.5–15.5)
WBC: 11 10*3/uL — ABNORMAL HIGH (ref 4.0–10.5)
nRBC: 0 % (ref 0.0–0.2)

## 2021-09-09 NOTE — Progress Notes (Signed)
PPD# 1 VBAC w/ intact perineum Information for the patient's newborn:  Kamie, Korber Girl Davina [[id number]]  female     S:   Reports feeling good Tolerating PO fluid and solids No nausea or vomiting Bleeding is light Pain controlled with  PO meds Up ad lib / ambulatory / voiding w/o difficulty Feeding: Bottle    O:   VS: BP 112/61 (BP Location: Right Arm)   Pulse 65   Temp 97.7 F (36.5 C) (Oral)   Resp 18   Ht 5\' 2"  (1.575 m)   Wt 109.8 kg   SpO2 100%   Breastfeeding Unknown   BMI 44.26 kg/m   LABS:  Recent Labs    09/08/21 0442 09/09/21 0435  WBC 11.5* 11.0*  HGB 13.8 11.7*  PLT 205 169   Blood type: --/--/O POS (09/11 12-03-2002) Rubella: Immune (03/08 0000)                      I&O: Intake/Output      09/11 0701 09/12 0700 09/12 0701 09/13 0700   Urine (mL/kg/hr) 50 (0)    Blood 100    Total Output 150    Net -150           Physical Exam: Alert and oriented X3 Lungs: Unlabored Heart: regular rate  Abdomen: soft, non-tender, non-distended  Fundus: firm, non-tender Perineum: intact Lochia: appropriate Extremities: no edema, no calf pain, tenderness, or cords    A:  PPD # 1   Normal exam  P:  Routine post partum orders Anticipate D/C on later today or tomorrow, newborn being evaluated for jaundice   Plan reviewed w/ Dr. 10/13, MSN, CNM 09/09/2021, 12:24 PM

## 2021-09-09 NOTE — Anesthesia Postprocedure Evaluation (Signed)
Anesthesia Post Note  Patient: Tina [name]  Procedure(s) Performed: AN AD HOC LABOR EPIDURAL     Patient location during evaluation: Mother Baby Anesthesia Type: Epidural Level of consciousness: awake and alert Pain management: pain level controlled Vital Signs Assessment: post-procedure vital signs reviewed and stable Respiratory status: spontaneous breathing, nonlabored ventilation and respiratory function stable Cardiovascular status: stable Postop Assessment: no headache, no backache and epidural receding Anesthetic complications: no   No notable events documented.  Last Vitals:  Vitals:   09/09/21 0030 09/09/21 0524  BP: 111/66 112/61  Pulse: 67 65  Resp: 18 18  Temp: 36.6 C 36.5 C  SpO2: 99% 100%    Last Pain:  Vitals:   09/09/21 0725  TempSrc:   PainSc: 0-No pain   Pain Goal: Patients Stated Pain Goal: 0 (09/08/21 0157)                 Marrion Coy

## 2021-09-09 NOTE — Lactation Note (Signed)
This note was copied from a baby's chart. Lactation Consultation Note  Patient Name: Tina [name] [id number]'K Date: 09/09/2021 Reason for consult: Follow-up assessment Age:31 hours  LC in to room for follow up. Mother states she is formula feeding exclusively. LC briefly discussed breast changes consistent with lactogenesis II and relief options. Mother shows appreciation for information. Mother politely declines future LC follow up. Encouraged to contact [facility] for questions or concerns as needed.    Feeding Mother's Current Feeding Choice: Formula Nipple Type: Extra Slow Flow  Interventions Interventions: Education;Pace feeding  Discharge Discharge Education: Engorgement and breast care  Consult Status Consult Status: Complete (mother declined follow up) Date: 09/09/21 Follow-up type: Call as needed    Diala Waxman A Higuera Ancidey 09/09/2021, 7:08 PM

## 2021-09-10 MED ORDER — IBUPROFEN 600 MG PO TABS: 600.0000 mg | ORAL_TABLET | Freq: Four times a day (QID) | ORAL | 0 refills | Status: DC

## 2021-09-10 MED ORDER — ACETAMINOPHEN 325 MG PO TABS: 650.0000 mg | ORAL_TABLET | ORAL | Status: DC | PRN

## 2021-09-10 NOTE — Discharge Summary (Signed)
VBAC OB Discharge Summary  Patient Name: Tina [name] DOB: 06-13-1991 MRN: [id number]  Date of admission: 09/08/2021 Intrauterine pregnancy: [redacted]w[redacted]d   Admitting diagnosis: Normal labor [O80, Z37.9] Secondary diagnosis: None  Date of discharge: 09/10/2021    Discharge diagnosis: Term Pregnancy Delivered     Prenatal history: [id number]   EDC : 09/07/2021, Alternate EDD Entry  Prenatal care at [facility]  Primary provider : [facility] Prenatal course complicated by N/A  Prenatal Labs: ABO, Rh: --/--/O POS (09/11 9485) /  Antibody: NEG (09/11 0508) Rubella: Immune (03/08 0000)  /  RPR: NON REACTIVE (09/11 0442)  HBsAg: Negative (03/08 0000)  HIV: Non-reactive (03/08 0000)  GBS: Negative/-- (08/09 0000)                                    Hospital course:  Onset of Labor With Vaginal Delivery      30 y.o. yo [id number] at 106w1d was admitted in Latent Labor on 09/08/2021. Patient had an uncomplicated labor course as follows:  Membrane Rupture Time/Date: 8:47 AM ,09/08/2021   Delivery Method:Vaginal, Spontaneous  Episiotomy: None  Lacerations:  None  Patient had an uncomplicated postpartum course.  She is ambulating, tolerating a regular diet, passing flatus, and urinating well. Patient is discharged home in stable condition on 09/10/21.  Newborn Data: Birth date:09/08/2021  Birth time:9:05 AM  Gender:Female  Living status:Living  Apgars:9 ,9  Weight:3640 g  Delivering PROVIDER: CRUMPLER, JENNIFER B                                                            Complications: None  Newborn Data: Live born female  Birth Weight: 8 lb 0.4 oz (3640 g) APGAR: 9, 9  Newborn Delivery   Birth date/time: 09/08/2021 09:05:00 Delivery type: Vaginal, Spontaneous      Baby Feeding: Breast Disposition:home with mother  Post partum procedures: N/A  Labs: Lab Results  Component Value Date   WBC 11.0 (H) 09/09/2021   HGB 11.7 (L) 09/09/2021   HCT 34.6 (L) 09/09/2021   MCV 94.8 09/09/2021   PLT  169 09/09/2021   CMP Latest Ref Rng & Units 04/19/2021  Glucose 70 - 99 mg/dL 93  BUN 6 - 20 mg/dL 7  Creatinine 0.09 - 3.81 mg/dL 8.29  Sodium 937 - 169 mmol/L 139  Potassium 3.5 - 5.1 mmol/L 3.7  Chloride 98 - 111 mmol/L 108  CO2 22 - 32 mmol/L 26  Calcium 8.9 - 10.3 mg/dL 9.0  Total Protein 6.5 - 8.1 g/dL 6.5  Total Bilirubin 0.3 - 1.2 mg/dL 0.3  Alkaline Phos 38 - 126 U/L 50  AST 15 - 41 U/L 11(L)  ALT 0 - 44 U/L 10    Physical Exam @ time of discharge:  Vitals:   09/09/21 0524 09/09/21 1504 09/09/21 2005 09/10/21 0518  BP: 112/61 117/79 128/85 128/73  Pulse: 65 67 71 66  Resp: 18 18 18 16   Temp: 97.7 F (36.5 C) 98.4 F (36.9 C) 98.2 F (36.8 C) 98 F (36.7 C)  TempSrc: Oral Oral Oral Oral  SpO2: 100% 100% 99% 100%  Weight:      Height:       general: alert, cooperative,  and no distress lochia: appropriate uterine fundus: firm perineum: intact incision: N/A extremities: DVT Evaluation: No evidence of DVT seen on physical exam. Negative Homan's sign. No cords or calf tenderness. No significant calf/ankle edema.  Discharge instructions:  "Baby and Me Booklet"  Discharge Medications:  Allergies as of 09/10/2021   No Known Allergies      Medication List     STOP taking these medications    cyclobenzaprine 10 MG tablet Commonly known as: FLEXERIL   NIFEdipine 30 MG 24 hr tablet Commonly known as: ADALAT CC       TAKE these medications    acetaminophen 325 MG tablet Commonly known as: Tylenol Take 2 tablets (650 mg total) by mouth every 4 (four) hours as needed (for pain scale < 4). What changed:  medication strength how much to take when to take this reasons to take this   ibuprofen 600 MG tablet Commonly known as: ADVIL Take 1 tablet (600 mg total) by mouth every 6 (six) hours.   prenatal multivitamin Tabs tablet Take 1 tablet by mouth daily at 12 noon.       Diet: routine diet Activity: Advance as tolerated. Pelvic rest x 6  weeks.  Follow up:4 weeks postpartum visit  Signed: Carollee Leitz MSN, CNM 09/10/2021, 9:15 AM

## 2021-09-11 ENCOUNTER — Telehealth: Payer: Self-pay

## 2021-09-11 NOTE — Telephone Encounter (Signed)
Transition Care Management Follow-up Telephone Call Date of discharge and from where: 09/10/2021- [facility] How have you been since you were released from the hospital? Patient stated she is doing good.  Any questions or concerns? No  Items Reviewed: Did the pt receive and understand the discharge instructions provided? Yes  Medications obtained and verified? Yes  Other? No  Any new allergies since your discharge? No  Dietary orders reviewed? N/A Do you have support at home? Yes   Home Care and Equipment/Supplies: Were home health services ordered? not applicable If so, what is the name of the agency? N/A  Has the agency set up a time to come to the patient's home? not applicable Were any new equipment or medical supplies ordered?  No What is the name of the medical supply agency? N/A Were you able to get the supplies/equipment? not applicable Do you have any questions related to the use of the equipment or supplies? No  Functional Questionnaire: (I = Independent and D = Dependent) ADLs: I  Bathing/Dressing- I  Meal Prep- I  Eating- I  Maintaining continence- I  Transferring/Ambulation- I  Managing Meds- I  Follow up appointments reviewed:  PCP Hospital f/u appt confirmed? No   Specialist Hospital f/u appt confirmed? No  . Are transportation arrangements needed? No  If their condition worsens, is the pt aware to call PCP or go to the Emergency Dept.? Yes Was the patient provided with contact information for the PCP's office or ED? Yes Was to pt encouraged to call back with questions or concerns? Yes

## 2021-09-14 ENCOUNTER — Inpatient Hospital Stay (HOSPITAL_COMMUNITY): Payer: Commercial Managed Care - PPO

## 2021-09-14 ENCOUNTER — Inpatient Hospital Stay (HOSPITAL_COMMUNITY)
Admission: AD | Admit: 2021-09-14 | Payer: Commercial Managed Care - PPO | Source: Home / Self Care | Admitting: Obstetrics and Gynecology

## 2021-09-23 ENCOUNTER — Telehealth (HOSPITAL_COMMUNITY): Payer: Self-pay | Admitting: *Deleted

## 2021-09-23 NOTE — Telephone Encounter (Signed)
Patient asked about normal vaginal bleeding. RN encouraged patient to notify her provider if she soaks a pad in an hour or if she's passing clots the size of a ping-pong ball or larger. Patient verbalized understanding and denied any complications at this time.Patient voiced no other questions or concerns regarding her own health. EPDS = 2. Patient asked about normal infant stooling patterns. Reported infant is formula fed and has one bowel movement every day. RN instructed patient that pattern is normal. Encouraged patient to call pediatrician if stool is hard and formed, runny and mucousy, or if she observes blood. Patient denied any complications. Patient voiced no other questions or concerns regarding baby at this time. Patient reported infant sleeps in a bassinet on her back. RN reviewed ABCs of safe sleep - patient verbalized understanding. Patient requested RN email information on hospital's virtual postpartum classes. Email sent. Deforest Hoyles, RN, 09/23/21, [phone].

## 2021-10-17 DIAGNOSIS — N898 Other specified noninflammatory disorders of vagina: Secondary | ICD-10-CM | POA: Insufficient documentation

## 2021-10-22 IMAGING — US US RENAL
1 series · 15 of 25 positions shown · non-contrast
Comparison: Ultrasound 04/19/2021

CLINICAL DATA: Hematuria in pregnancy

EXAM:
RENAL / URINARY TRACT ULTRASOUND COMPLETE

[Series 1: us renal · 15 of 38 slices shown]
[im 1/38]
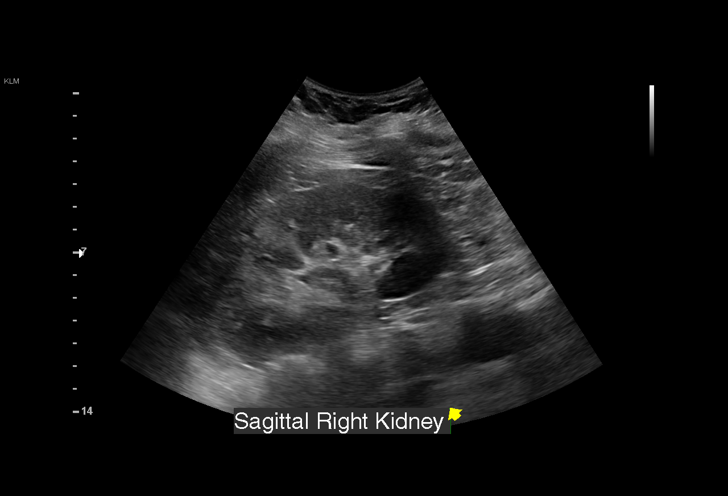
[im 4/38]
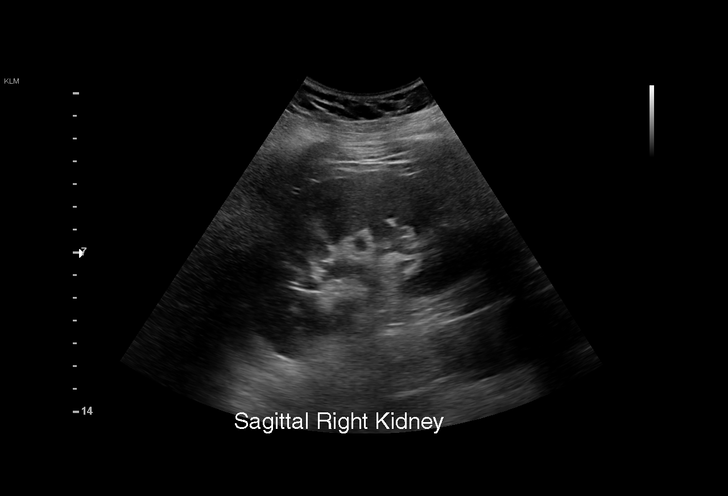
[im 7/38]
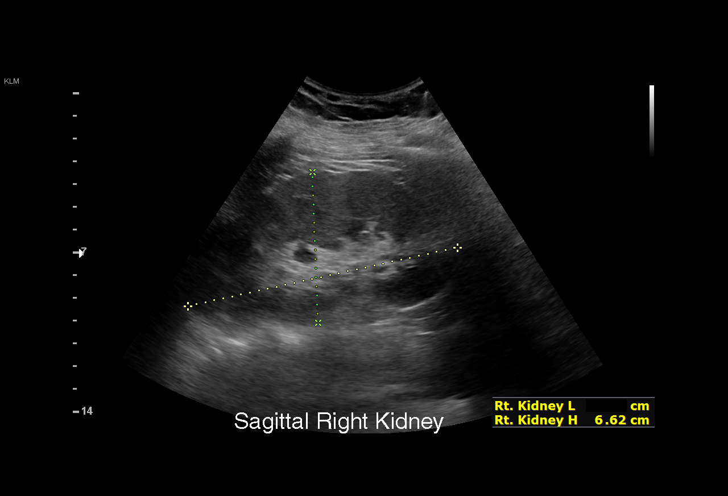
[im 8/38]
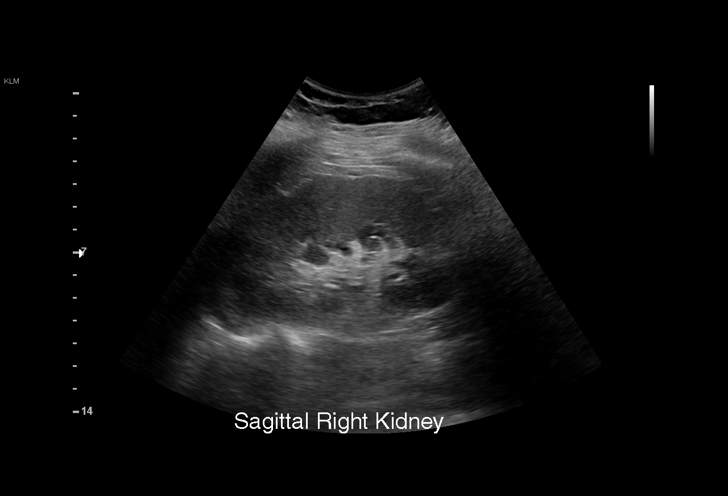
[im 11/38]
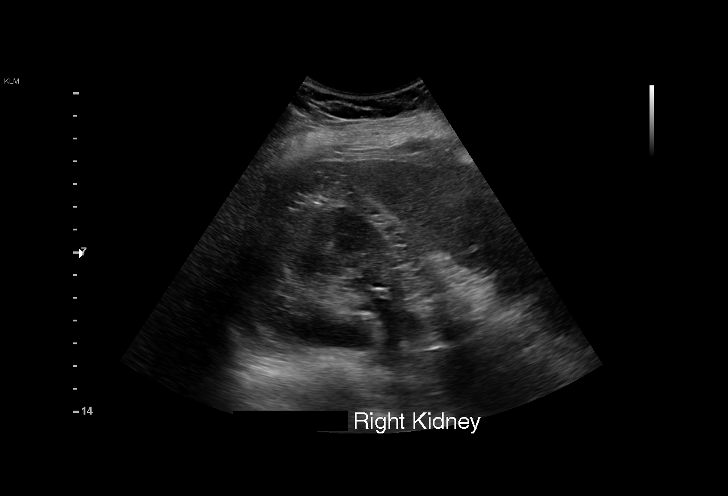
[im 14/38]
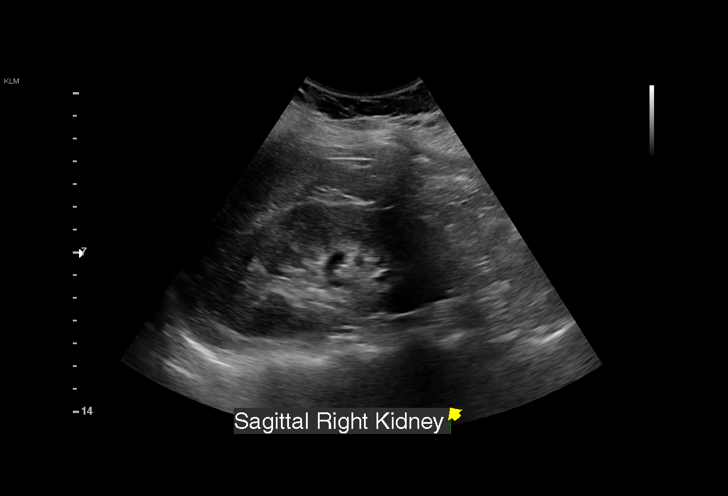
[im 16/38]
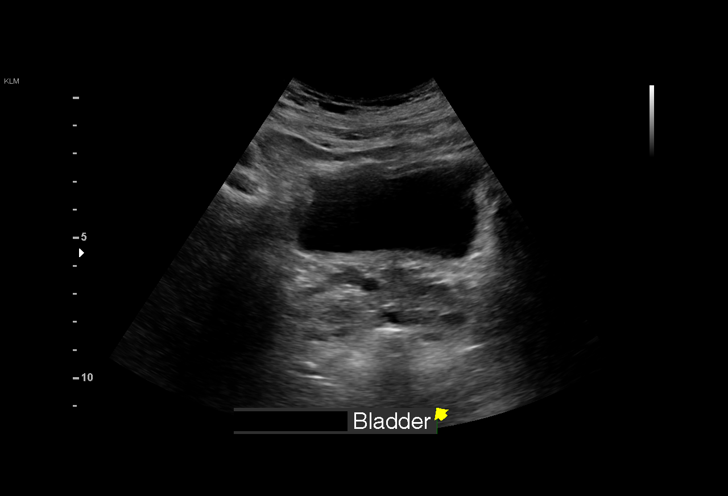
[im 19/38]
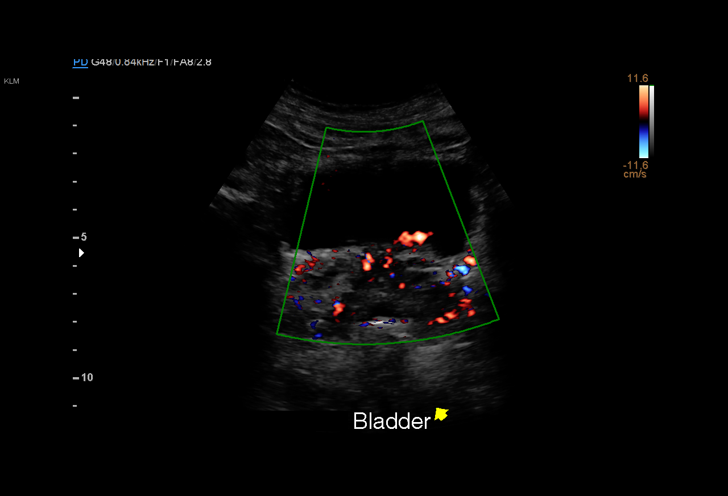
[im 22/38]
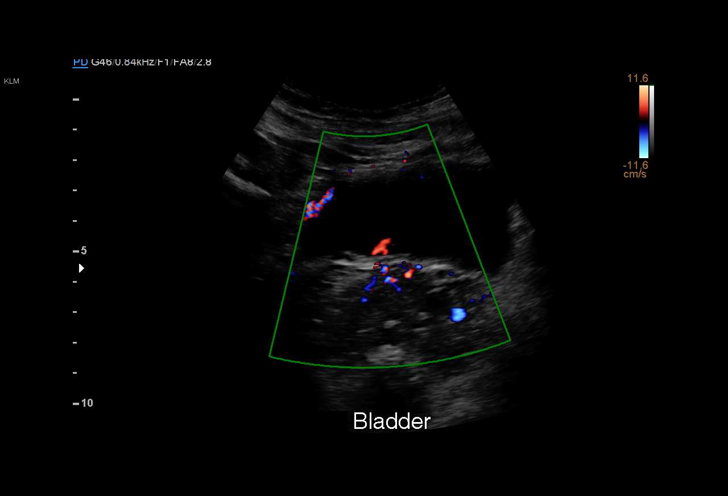
[im 24/38]
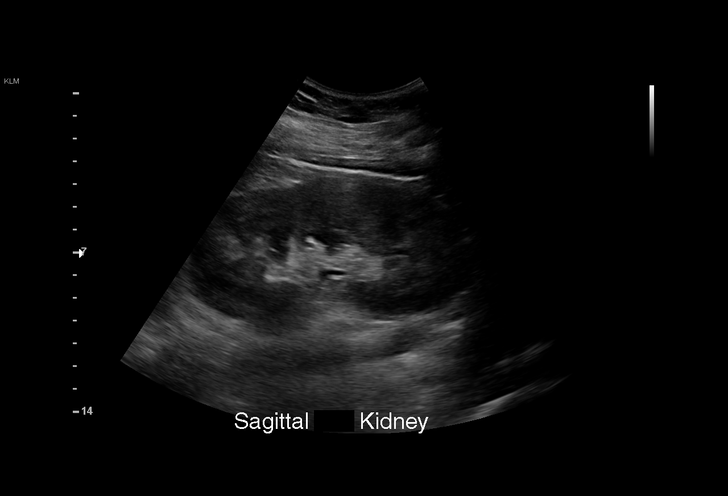
[im 27/38]
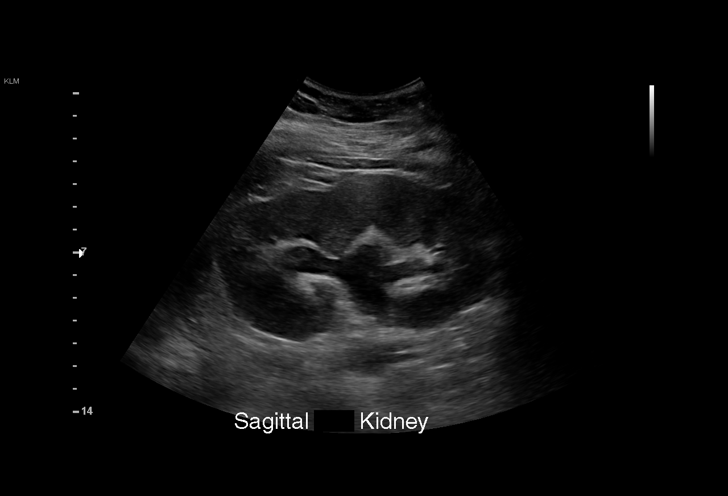
[im 30/38]
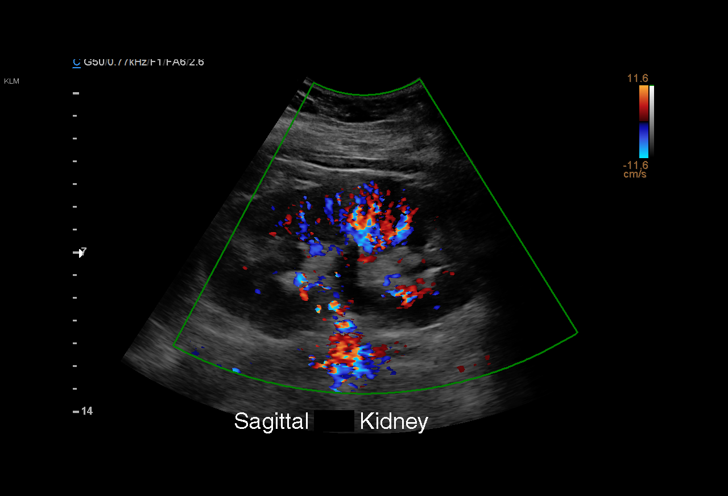
[im 31/38]
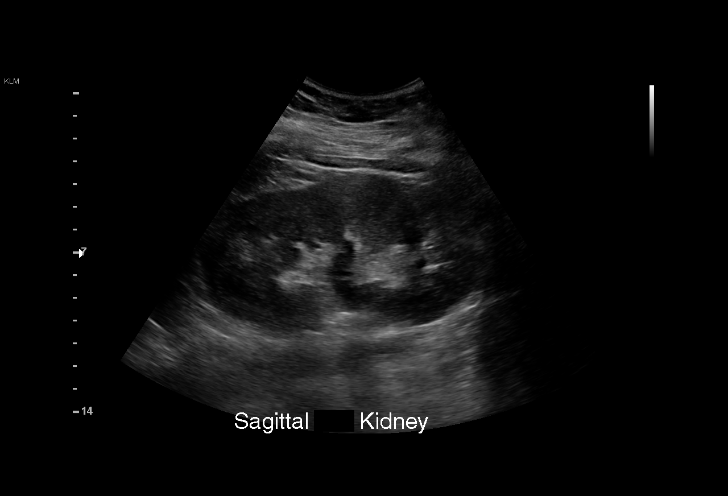
[im 34/38]
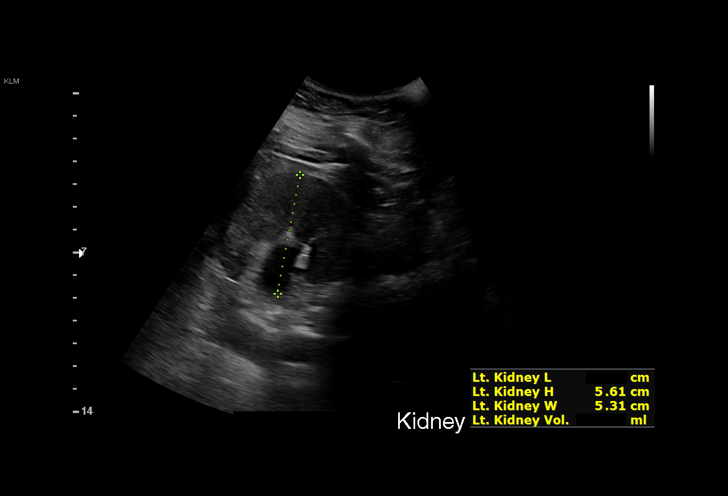
[im 38/38]
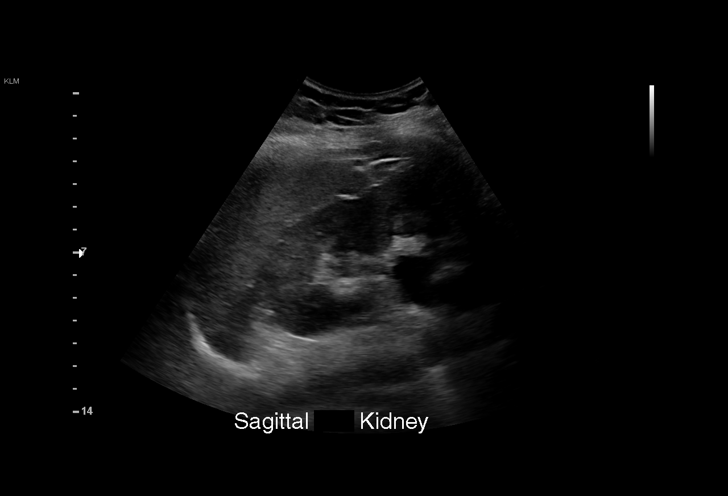

[15 of 25 positions shown; findings below may reference images not displayed]

FINDINGS: Right Kidney:

Renal measurements: 12.1 x 6.6 x 6.4 cm = volume: 269 mL.
Echogenicity within normal limits. No mass, shadowing stone, or
hydronephrosis visualized.

Left Kidney:

Renal measurements: 13.1 x 5.6 x 5.3 cm = volume: 205 mL.
Echogenicity within normal limits. Moderate left hydronephrosis. No
mass or shadowing stone visualized.

Bladder:

Appears normal for degree of bladder distention. Bilateral ureteral
jets visualized.

Other:

None.
IMPRESSION: Moderate left hydronephrosis.

## 2021-11-01 DIAGNOSIS — H5213 Myopia, bilateral: Secondary | ICD-10-CM | POA: Diagnosis not present

## 2021-11-27 ENCOUNTER — Encounter: Payer: Self-pay | Admitting: Family Medicine

## 2021-11-27 ENCOUNTER — Other Ambulatory Visit (HOSPITAL_COMMUNITY)
Admission: RE | Admit: 2021-11-27 | Discharge: 2021-11-27 | Disposition: A | Discharge status: Home / Self Care | Payer: Commercial Managed Care - PPO | Source: Ambulatory Visit | Attending: Family Medicine | Admitting: Family Medicine

## 2021-11-27 ENCOUNTER — Other Ambulatory Visit: Payer: Self-pay

## 2021-11-27 ENCOUNTER — Ambulatory Visit: Payer: Commercial Managed Care - PPO | Attending: Family Medicine | Admitting: Family Medicine

## 2021-11-27 VITALS — BP 110/76 | HR 77 | Ht 62.0 in | Wt 229.4 lb

## 2021-11-27 DIAGNOSIS — Z113 Encounter for screening for infections with a predominantly sexual mode of transmission: Secondary | ICD-10-CM | POA: Diagnosis present

## 2021-11-27 DIAGNOSIS — Z0000 Encounter for general adult medical examination without abnormal findings: Secondary | ICD-10-CM

## 2021-11-27 DIAGNOSIS — Z13228 Encounter for screening for other metabolic disorders: Secondary | ICD-10-CM

## 2021-11-27 DIAGNOSIS — Z1159 Encounter for screening for other viral diseases: Secondary | ICD-10-CM

## 2021-11-27 DIAGNOSIS — Z124 Encounter for screening for malignant neoplasm of cervix: Secondary | ICD-10-CM | POA: Diagnosis present

## 2021-11-27 DIAGNOSIS — G4489 Other headache syndrome: Secondary | ICD-10-CM

## 2021-11-27 MED ORDER — BUTALBITAL-APAP-CAFFEINE 50-325-40 MG PO TABS: 1.0000 | ORAL_TABLET | Freq: Two times a day (BID) | ORAL | 0 refills | Status: DC | PRN

## 2021-11-27 NOTE — Patient Instructions (Signed)

## 2021-11-27 NOTE — Progress Notes (Signed)
Subjective:  Patient ID: Tina [name], female    DOB: [birth year]  Age: 30 y.o. MRN: [id number]  CC: New Patient (Initial Visit)   HPI Tina [name] is a 30 y.o. year old female who presents today to establish care.  She would like to have an annual physical exam. Complains of intermittent headaches which she describes as migraines and this occurs about 3 times a month with no associated blurry vision , photophobia, auras.   Past Medical History:  Diagnosis Date   Preterm labor     Past Surgical History:  Procedure Laterality Date   CESAREAN SECTION N/A 07/09/2015   Procedure: CESAREAN SECTION;  Surgeon: Osborn Coho, MD;  Location: [facility];  Service: Obstetrics;  Laterality: N/A;   WISDOM TOOTH EXTRACTION  2015    Family History  Problem Relation Age of Onset   Diabetes Paternal Aunt    Alcohol abuse Maternal Grandfather    COPD Maternal Grandfather    Diabetes Paternal Grandmother    Healthy Mother    Healthy Father    Heart disease Paternal Uncle        dad's "brothers past away from heart attacks"   Arthritis Neg Hx    Asthma Neg Hx    Birth defects Neg Hx    Depression Neg Hx    Drug abuse Neg Hx    Early death Neg Hx    Hearing loss Neg Hx    Hyperlipidemia Neg Hx    Hypertension Neg Hx    Kidney disease Neg Hx    Learning disabilities Neg Hx    Mental illness Neg Hx    Mental retardation Neg Hx    Miscarriages / Stillbirths Neg Hx    Stroke Neg Hx    Vision loss Neg Hx    Varicose Veins Neg Hx     No Known Allergies  Outpatient Medications Prior to Visit  Medication Sig Dispense Refill   acetaminophen (TYLENOL) 325 MG tablet Take 2 tablets (650 mg total) by mouth every 4 (four) hours as needed (for pain scale < 4). (Patient not taking: Reported on 11/27/2021)     ibuprofen (ADVIL) 600 MG tablet Take 1 tablet (600 mg total) by mouth every 6 (six) hours. (Patient not taking: Reported on 11/27/2021) 30 tablet 0   Prenatal Vit-Fe Fumarate-FA (PRENATAL  MULTIVITAMIN) TABS tablet Take 1 tablet by mouth daily at 12 noon. (Patient not taking: Reported on 11/27/2021)     No facility-administered medications prior to visit.     ROS Review of Systems  Constitutional:  Negative for activity change, appetite change and fatigue.  HENT:  Negative for congestion, sinus pressure and sore throat.   Eyes:  Negative for visual disturbance.  Respiratory:  Negative for cough, chest tightness, shortness of breath and wheezing.   Cardiovascular:  Negative for chest pain and palpitations.  Gastrointestinal:  Negative for abdominal distention, abdominal pain and constipation.  Endocrine: Negative for polydipsia.  Genitourinary:  Negative for dysuria and frequency.  Musculoskeletal:  Negative for arthralgias and back pain.  Skin:  Negative for rash.  Neurological:  Positive for headaches. Negative for tremors, light-headedness and numbness.  Hematological:  Does not bruise/bleed easily.  Psychiatric/Behavioral:  Negative for agitation and behavioral problems.    Objective:  BP 110/76   Pulse 77   Ht 5\' 2"  (1.575 m)   Wt 229 lb 6.4 oz (104.1 kg)   SpO2 100%   BMI 41.96 kg/m   BP/Weight 11/27/2021 09/10/2021 09/08/2021  Systolic  BP 110 128 -  Diastolic BP 76 73 -  Wt. (Lbs) 229.4 - 242  BMI 41.96 - 44.26      Physical Exam Exam conducted with a chaperone present.  Constitutional:      General: She is not in acute distress.    Appearance: She is well-developed. She is not diaphoretic.  HENT:     Head: Normocephalic.     Right Ear: External ear normal.     Left Ear: External ear normal.     Nose: Nose normal.  Eyes:     Conjunctiva/sclera: Conjunctivae normal.     Pupils: Pupils are equal, round, and reactive to light.  Neck:     Vascular: No JVD.  Cardiovascular:     Rate and Rhythm: Normal rate and regular rhythm.     Heart sounds: Normal heart sounds. No murmur heard.   No gallop.  Pulmonary:     Effort: Pulmonary effort is  normal. No respiratory distress.     Breath sounds: Normal breath sounds. No wheezing or rales.  Chest:     Chest wall: No tenderness.  Breasts:    Right: Normal. No mass, nipple discharge or tenderness.     Left: Normal. No mass, nipple discharge or tenderness.  Abdominal:     General: Bowel sounds are normal. There is no distension.     Palpations: Abdomen is soft. There is no mass.     Tenderness: There is no abdominal tenderness.     Hernia: There is no hernia in the left inguinal area or right inguinal area.  Genitourinary:    General: Normal vulva.     Pubic Area: No rash.      Labia:        Right: No rash.        Left: No rash.      Vagina: Vaginal discharge (bloody) present.     Cervix: Normal.     Uterus: Normal.      Adnexa: Right adnexa normal and left adnexa normal.       Right: No tenderness.         Left: No tenderness.    Musculoskeletal:        General: No tenderness. Normal range of motion.     Cervical back: Normal range of motion. No tenderness.  Lymphadenopathy:     Upper Body:     Right upper body: No supraclavicular or axillary adenopathy.     Left upper body: No supraclavicular or axillary adenopathy.  Skin:    General: Skin is warm and dry.  Neurological:     Mental Status: She is alert and oriented to person, place, and time.     Deep Tendon Reflexes: Reflexes are normal and symmetric.    CMP Latest Ref Rng & Units 04/19/2021 03/02/2020 07/23/2018  Glucose 70 - 99 mg/dL 93 89 95  BUN 6 - 20 mg/dL 7 7 10   Creatinine 0.44 - 1.00 mg/dL 1.17 3.56  Sodium 135 - 145 mmol/L 139 137 137  Potassium 3.5 - 5.1 mmol/L 3.7 3.5 3.6  Chloride 98 - 111 mmol/L 108 109 108  CO2 22 - 32 mmol/L 26 21(L) 26  Calcium 8.9 - 10.3 mg/dL 9.0 9.2 7.01)  Total Protein 6.5 - 8.1 g/dL 6.5 4.1(C) 7.2  Total Bilirubin 0.3 - 1.2 mg/dL 0.3 0.5 0.5  Alkaline Phos 38 - 126 U/L 50 125 47  AST 15 - 41 U/L 11(L) 20 15  ALT 0 - 44 U/L  10 17 20     Lipid Panel  No results  found for: CHOL, TRIG, HDL, CHOLHDL, VLDL, LDLCALC, LDLDIRECT  CBC    Component Value Date/Time   WBC 11.0 (H) 09/09/2021 0435   RBC 3.65 (L) 09/09/2021 0435   HGB 11.7 (L) 09/09/2021 0435   HCT 34.6 (L) 09/09/2021 0435   PLT 169 09/09/2021 0435   MCV 94.8 09/09/2021 0435   MCH 32.1 09/09/2021 0435   MCHC 33.8 09/09/2021 0435   RDW 13.7 09/09/2021 0435   LYMPHSABS 2.6 04/19/2021 1753   MONOABS 0.7 04/19/2021 1753   EOSABS 0.2 04/19/2021 1753   BASOSABS 0.0 04/19/2021 1753    No results found for: HGBA1C  Assessment & Plan:  1. Annual physical exam Counseled on 150 minutes of exercise per week, healthy eating (including decreased daily intake of saturated fats, cholesterol, added sugars, sodium), STI prevention, routine healthcare maintenance.   2. Screening for cervical cancer - Cytology - PAP  3. Need for hepatitis C screening test - HCV Ab w Reflex to Quant PCR  4. Screening for STD (sexually transmitted disease) - Cervicovaginal ancillary only  5. Screening for metabolic disorder - Hemoglobin A1c  6. Other headache syndrome This is infrequent If frequency increases consider prophylactic medication - butalbital-acetaminophen-caffeine (FIORICET) 50-325-40 MG tablet; Take 1 tablet by mouth every 12 (twelve) hours as needed for headache.  Dispense: 20 tablet; Refill: 0   Meds ordered this encounter  Medications   butalbital-acetaminophen-caffeine (FIORICET) 50-325-40 MG tablet    Sig: Take 1 tablet by mouth every 12 (twelve) hours as needed for headache.    Dispense:  20 tablet    Refill:  0    Follow-up: Return in about 3 months (around 02/25/2022) for fu on headache.       02/27/2022, MD, FAAFP. [facility] [place], [place]   11/27/2021, 12:26 PM

## 2021-11-28 LAB — CERVICOVAGINAL ANCILLARY ONLY
Bacterial Vaginitis (gardnerella): POSITIVE — AB
Candida Glabrata: NEGATIVE
Candida Vaginitis: NEGATIVE
Chlamydia: NEGATIVE
Comment: NEGATIVE
Comment: NEGATIVE
Comment: NEGATIVE
Comment: NEGATIVE
Comment: NEGATIVE
Comment: NORMAL
Neisseria Gonorrhea: NEGATIVE
Trichomonas: NEGATIVE

## 2021-11-28 LAB — HEMOGLOBIN A1C
Est. average glucose Bld gHb Est-mCnc: 103 mg/dL
Hgb A1c MFr Bld: 5.2 % (ref 4.8–5.6)

## 2021-11-28 LAB — HCV AB W REFLEX TO QUANT PCR: HCV Ab: <0.1 s/co ratio (ref 0.0–0.9)

## 2021-11-28 LAB — HCV INTERPRETATION

## 2021-11-29 ENCOUNTER — Other Ambulatory Visit: Payer: Self-pay | Admitting: Family Medicine

## 2021-11-29 LAB — CYTOLOGY - PAP
Comment: NEGATIVE
Diagnosis: NEGATIVE
High risk HPV: NEGATIVE

## 2021-11-29 MED ORDER — METRONIDAZOLE 0.75 % VA GEL: 1.0000 | Freq: Every day | VAGINAL | 0 refills | Status: DC

## 2021-12-01 ENCOUNTER — Encounter: Payer: Self-pay | Admitting: Family Medicine

## 2022-01-06 DIAGNOSIS — R102 Pelvic and perineal pain: Secondary | ICD-10-CM | POA: Diagnosis not present

## 2022-01-06 DIAGNOSIS — N83209 Unspecified ovarian cyst, unspecified side: Secondary | ICD-10-CM | POA: Diagnosis not present

## 2022-01-06 DIAGNOSIS — T8389XA Other specified complication of genitourinary prosthetic devices, implants and grafts, initial encounter: Secondary | ICD-10-CM | POA: Diagnosis not present

## 2022-01-06 DIAGNOSIS — Z30431 Encounter for routine checking of intrauterine contraceptive device: Secondary | ICD-10-CM | POA: Diagnosis not present

## 2022-01-06 DIAGNOSIS — N9412 Deep dyspareunia: Secondary | ICD-10-CM | POA: Diagnosis not present

## 2022-01-23 ENCOUNTER — Telehealth: Payer: Commercial Managed Care - PPO | Admitting: Family Medicine

## 2022-02-22 DIAGNOSIS — R87810 Cervical high risk human papillomavirus (HPV) DNA test positive: Secondary | ICD-10-CM | POA: Insufficient documentation

## 2022-02-22 DIAGNOSIS — A6000 Herpesviral infection of urogenital system, unspecified: Secondary | ICD-10-CM | POA: Insufficient documentation

## 2022-02-22 DIAGNOSIS — Z8751 Personal history of pre-term labor: Secondary | ICD-10-CM | POA: Insufficient documentation

## 2022-02-22 NOTE — Progress Notes (Signed)
New Patient Office Visit  Subjective:  Patient ID: Tina [name], female    DOB: 12-25-1991  Age: 31 y.o. MRN: [id number]  CC:  Chief Complaint  Patient presents with   Headache    HPI Tina [name] presents for work in visit for headaches.  Patient was seen by her primary care provider for a physical exam in November and given Fioricet but this is not helped the headaches.  She has visual auras that come before the headaches.  She does not drink alcohol she does not drink caffeine. Patient denies any nausea or vomiting.  Headaches are bifrontal in nature.  Patient has a Mirena that is been newly placed.  She does not wish to have further children.  There are no other complaints.  Past Medical History:  Diagnosis Date   Preterm labor     Past Surgical History:  Procedure Laterality Date   CESAREAN SECTION N/A 07/09/2015   Procedure: CESAREAN SECTION;  Surgeon: Tina Coho, MD;  Location: [facility];  Service: Obstetrics;  Laterality: N/A;   WISDOM TOOTH EXTRACTION  2015    Family History  Problem Relation Age of Onset   Diabetes Paternal Aunt    Alcohol abuse Maternal Grandfather    COPD Maternal Grandfather    Diabetes Paternal Grandmother    Healthy Mother    Healthy Father    Heart disease Paternal Uncle        dad's "brothers past away from heart attacks"   Arthritis Neg Hx    Asthma Neg Hx    Birth defects Neg Hx    Depression Neg Hx    Drug abuse Neg Hx    Early death Neg Hx    Hearing loss Neg Hx    Hyperlipidemia Neg Hx    Hypertension Neg Hx    Kidney disease Neg Hx    Learning disabilities Neg Hx    Mental illness Neg Hx    Mental retardation Neg Hx    Miscarriages / Stillbirths Neg Hx    Stroke Neg Hx    Vision loss Neg Hx    Varicose Veins Neg Hx     Social History   Socioeconomic History   Marital status: Single    Spouse name: Not on file   Number of children: Not on file   Years of education: Not on file   Highest education level: Not  on file  Occupational History   Not on file  Tobacco Use   Smoking status: Never   Smokeless tobacco: Never  Vaping Use   Vaping Use: Never used  Substance and Sexual Activity   Alcohol use: No   Drug use: No   Sexual activity: Yes    Birth control/protection: None  Other Topics Concern   Not on file  Social History Narrative   Not on file   Social Determinants of Health   Financial Resource Strain: Not on file  Food Insecurity: Not on file  Transportation Needs: Not on file  Physical Activity: Not on file  Stress: Not on file  Social Connections: Not on file  Intimate Partner Violence: Not on file    ROS Review of Systems  Constitutional:  Negative for chills, diaphoresis, fatigue and fever.  HENT:  Positive for dental problem. Negative for congestion, ear discharge, ear pain, facial swelling, hearing loss, mouth sores, nosebleeds, postnasal drip, rhinorrhea, sinus pressure, sinus pain, sneezing, sore throat and tinnitus.   Eyes:  Positive for photophobia, pain and visual disturbance. Negative  for discharge.  Respiratory:  Negative for cough, shortness of breath, wheezing and stridor.        No excess mucus  Cardiovascular:  Negative for chest pain, palpitations and leg swelling.  Gastrointestinal:  Positive for abdominal pain and nausea. Negative for blood in stool, constipation, diarrhea and vomiting.       Gerd  Endocrine: Negative for polydipsia.  Genitourinary: Negative.  Negative for dysuria, flank pain, frequency, hematuria and urgency.  Musculoskeletal:  Negative for back pain, myalgias and neck pain.  Skin: Negative.  Negative for rash.  Allergic/Immunologic: Negative for environmental allergies.  Neurological:  Positive for dizziness, light-headedness and headaches. Negative for tremors, seizures, syncope, speech difficulty and weakness.  Hematological:  Does not bruise/bleed easily.  Psychiatric/Behavioral:  Negative for decreased concentration, dysphoric  mood, hallucinations, self-injury, sleep disturbance and suicidal ideas. The patient is not nervous/anxious and is not hyperactive.   All other systems reviewed and are negative.  Objective:   Today's Vitals: BP 121/83    Pulse 70    Wt 226 lb 3.2 oz (102.6 kg)    SpO2 100%    BMI 41.37 kg/m   Physical Exam Vitals reviewed.  Constitutional:      Appearance: Normal appearance. She is well-developed. She is not diaphoretic.  HENT:     Head: Normocephalic and atraumatic.     Nose: No nasal deformity, septal deviation, mucosal edema or rhinorrhea.     Right Sinus: No maxillary sinus tenderness or frontal sinus tenderness.     Left Sinus: No maxillary sinus tenderness or frontal sinus tenderness.     Mouth/Throat:     Pharynx: No oropharyngeal exudate.  Eyes:     General: No visual field deficit or scleral icterus.    Conjunctiva/sclera: Conjunctivae normal.     Pupils: Pupils are equal, round, and reactive to light.  Neck:     Thyroid: No thyromegaly.     Vascular: No carotid bruit or JVD.     Trachea: Trachea normal. No tracheal tenderness or tracheal deviation.  Cardiovascular:     Rate and Rhythm: Normal rate and regular rhythm.     Chest Wall: PMI is not displaced.     Pulses: Normal pulses. No decreased pulses.     Heart sounds: Normal heart sounds, S1 normal and S2 normal. Heart sounds not distant. No murmur heard. No systolic murmur is present.  No diastolic murmur is present.    No friction rub. No gallop. No S3 or S4 sounds.  Pulmonary:     Effort: No tachypnea, accessory muscle usage or respiratory distress.     Breath sounds: No stridor. No decreased breath sounds, wheezing, rhonchi or rales.  Chest:     Chest wall: No tenderness.  Abdominal:     General: Bowel sounds are normal. There is no distension.     Palpations: Abdomen is soft. Abdomen is not rigid.     Tenderness: There is no abdominal tenderness. There is no guarding or rebound.  Musculoskeletal:         General: Normal range of motion.     Cervical back: Normal range of motion and neck supple. No edema, erythema or rigidity. No muscular tenderness. Normal range of motion.  Lymphadenopathy:     Head:     Right side of head: No submental or submandibular adenopathy.     Left side of head: No submental or submandibular adenopathy.     Cervical: No cervical adenopathy.  Skin:    General: Skin  is warm and dry.     Coloration: Skin is not pale.     Findings: No rash.     Nails: There is no clubbing.     Comments: Tinea pedis between the fourth and fifth toe on the right foot  Neurological:     Mental Status: She is alert and oriented to person, place, and time. Mental status is at baseline.     GCS: GCS eye subscore is 4. GCS verbal subscore is 5. GCS motor subscore is 6.     Cranial Nerves: No cranial nerve deficit, dysarthria or facial asymmetry.     Sensory: No sensory deficit.     Motor: No weakness.     Coordination: Romberg sign negative. Coordination normal.     Gait: Gait normal.     Deep Tendon Reflexes: Reflexes normal.  Psychiatric:        Mood and Affect: Mood normal. Mood is not anxious or depressed.        Speech: Speech normal.        Behavior: Behavior normal. Behavior is not agitated.        Cognition and Memory: Cognition is not impaired. Memory is not impaired.    Assessment & Plan:   Problem List Items Addressed This Visit       Cardiovascular and Mediastinum   Chronic migraine without aura without status migrainosus, not intractable - Primary    Chronic headaches I suspect are migraine in nature  I gave the patient some general supportive measures to reduce the risk of migraines  Plan to order an urgent referral to neurology for consultation      Relevant Orders   Ambulatory referral to Neurology     Musculoskeletal and Integument   Tinea pedis    Tinea pedis between the digits of the fourth and fifth right toe  Plan topical Lotrisone cream       Relevant Medications   clotrimazole-betamethasone (LOTRISONE) cream    Outpatient Encounter Medications as of 02/24/2022  Medication Sig   clotrimazole-betamethasone (LOTRISONE) cream Apply 1 application topically 2 (two) times daily. To feet affected area   levonorgestrel (MIRENA, 52 MG,) 20 MCG/DAY IUD    Multiple Vitamins-Minerals (MULTIVITAMIN ADULT EXTRA C PO)    [DISCONTINUED] butalbital-acetaminophen-caffeine (FIORICET) 50-325-40 MG tablet Take 1 tablet by mouth every 12 (twelve) hours as needed for headache.   acetaminophen (TYLENOL) 325 MG tablet Take 2 tablets (650 mg total) by mouth every 4 (four) hours as needed (for pain scale < 4). (Patient not taking: Reported on 11/27/2021)   [DISCONTINUED] ibuprofen (ADVIL) 600 MG tablet Take 1 tablet (600 mg total) by mouth every 6 (six) hours. (Patient not taking: Reported on 11/27/2021)   [DISCONTINUED] metroNIDAZOLE (METROGEL VAGINAL) 0.75 % vaginal gel Place 1 Applicatorful vaginally at bedtime. (Patient not taking: Reported on 02/24/2022)   [DISCONTINUED] Prenatal Vit-Fe Fumarate-FA (PRENATAL MULTIVITAMIN) TABS tablet Take 1 tablet by mouth daily at 12 noon. (Patient not taking: Reported on 11/27/2021)   No facility-administered encounter medications on file as of 02/24/2022.    Follow-up: No follow-ups on file.   Asencion Noble, MD

## 2022-02-24 ENCOUNTER — Ambulatory Visit: Payer: Commercial Managed Care - PPO | Attending: Critical Care Medicine | Admitting: Critical Care Medicine

## 2022-02-24 ENCOUNTER — Encounter: Payer: Self-pay | Admitting: Critical Care Medicine

## 2022-02-24 ENCOUNTER — Other Ambulatory Visit: Payer: Self-pay

## 2022-02-24 VITALS — BP 121/83 | HR 70 | Wt 226.2 lb

## 2022-02-24 DIAGNOSIS — B353 Tinea pedis: Secondary | ICD-10-CM | POA: Insufficient documentation

## 2022-02-24 DIAGNOSIS — G43709 Chronic migraine without aura, not intractable, without status migrainosus: Secondary | ICD-10-CM | POA: Diagnosis not present

## 2022-02-24 MED ORDER — CLOTRIMAZOLE-BETAMETHASONE 1-0.05 % EX CREA: 1.0000 "application " | TOPICAL_CREAM | Freq: Two times a day (BID) | CUTANEOUS | 0 refills | Status: DC

## 2022-02-24 NOTE — Assessment & Plan Note (Signed)
Tinea pedis between the digits of the fourth and fifth right toe  Plan topical Lotrisone cream

## 2022-02-24 NOTE — Assessment & Plan Note (Signed)
Chronic headaches I suspect are migraine in nature  I gave the patient some general supportive measures to reduce the risk of migraines  Plan to order an urgent referral to neurology for consultation

## 2022-02-24 NOTE — Patient Instructions (Addendum)
Try to follow the healthy diet on the lifestyle medicine handout that was given  A neurology referral was made  Migraine is likely diagnosis:  Limit use of pain relievers to no more than 2 days out of the week.  These medications include acetaminophen, ibuprofen, .  This will help reduce risk of rebound headaches.   Be aware of common food triggers such as processed sweets, processed foods with nitrites (such as deli meat, hot dogs, sausages), foods with MSG, alcohol (such as wine), chocolate, certain cheeses, certain fruits (dried fruits, bananas, pineapple), vinegar, diet soda.   STOP CAFFEINE (COFFEE, PEPSI)   Routine exercise  Proper sleep hygiene>> hard with new baby  Stay adequately hydrated with water  Keep a headache diary. .  Maintain proper stress management.   Do not skip meals.   Consider supplements:  Magnesium citrate 400mg  to 600mg  daily, riboflavin 400mg , Coenzyme Q 10 100mg  three times daily  Keep follow up appts with Dr 

## 2022-02-25 ENCOUNTER — Ambulatory Visit: Payer: Commercial Managed Care - PPO | Admitting: Family Medicine

## 2022-02-25 ENCOUNTER — Encounter: Payer: Self-pay | Admitting: Neurology

## 2022-03-11 ENCOUNTER — Ambulatory Visit: Payer: Commercial Managed Care - PPO | Admitting: Critical Care Medicine

## 2022-04-07 DIAGNOSIS — N83209 Unspecified ovarian cyst, unspecified side: Secondary | ICD-10-CM | POA: Diagnosis not present

## 2022-04-07 DIAGNOSIS — T8389XD Other specified complication of genitourinary prosthetic devices, implants and grafts, subsequent encounter: Secondary | ICD-10-CM | POA: Diagnosis not present

## 2022-04-07 DIAGNOSIS — N9412 Deep dyspareunia: Secondary | ICD-10-CM | POA: Diagnosis not present

## 2022-04-07 DIAGNOSIS — N83201 Unspecified ovarian cyst, right side: Secondary | ICD-10-CM | POA: Diagnosis not present

## 2022-04-07 DIAGNOSIS — R102 Pelvic and perineal pain: Secondary | ICD-10-CM | POA: Diagnosis not present
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
## 2022-04-10 ENCOUNTER — Encounter: Payer: Self-pay | Admitting: Neurology

## 2022-04-10 ENCOUNTER — Ambulatory Visit: Payer: Medicaid Other | Admitting: Neurology

## 2022-04-10 ENCOUNTER — Other Ambulatory Visit: Payer: Self-pay | Admitting: Neurology

## 2022-04-10 VITALS — BP 112/74 | HR 61 | Ht 62.0 in | Wt 220.2 lb

## 2022-04-10 DIAGNOSIS — G44229 Chronic tension-type headache, not intractable: Secondary | ICD-10-CM | POA: Diagnosis not present

## 2022-04-10 DIAGNOSIS — G43009 Migraine without aura, not intractable, without status migrainosus: Secondary | ICD-10-CM

## 2022-04-10 MED ORDER — NORTRIPTYLINE HCL 10 MG PO CAPS: 10.0000 mg | ORAL_CAPSULE | Freq: Every day | ORAL | 5 refills | Status: DC

## 2022-04-10 MED ORDER — RIZATRIPTAN BENZOATE 10 MG PO TABS: 10.0000 mg | ORAL_TABLET | ORAL | 5 refills | Status: DC | PRN

## 2022-04-10 MED ORDER — ONDANSETRON HCL 4 MG PO TABS: 4.0000 mg | ORAL_TABLET | Freq: Three times a day (TID) | ORAL | 5 refills | Status: DC | PRN

## 2022-04-10 NOTE — Patient Instructions (Addendum)
?  Start nortriptyline 10mg  at bedtime.  Contact in 4 weeks with update and we can increase dose if needed. ?Take rizatriptan 10mg  at earliest onset of migraine.  May repeat dose once in 2 hours if needed.  Maximum 2 tablets in 24 hours. ?Take ondansetron at earliest onset of migraine for nausea ?For tension headaches, may try ibuprofen (up to 800mg ) ?Limit use of pain relievers to no more than 2 days out of the week.  These medications include acetaminophen, NSAIDs (ibuprofen/Advil/Motrin, naproxen/Aleve, triptans (Imitrex/sumatriptan/rizatriptan), Excedrin, and narcotics.  This will help reduce risk of rebound headaches. ?Be aware of common food triggers: ? - Caffeine:  coffee, black tea, cola, Mt. Dew ? - Chocolate ? - Dairy:  aged cheeses (brie, blue, cheddar, gouda, [place], provolone, [place], Swiss, etc), chocolate milk, buttermilk, sour cream, limit eggs and yogurt ? - Nuts, peanut butter ? - Alcohol ? - Cereals/grains:  FRESH breads (fresh bagels, sourdough, doughnuts), yeast productions ? - Processed/canned/aged/cured meats (pre-packaged deli meats, hotdogs) ? - MSG/glutamate:  soy sauce, flavor enhancer, pickled/preserved/marinated foods ? - Sweeteners:  aspartame (Equal, Nutrasweet).  Sugar and Splenda are okay ? - Vegetables:  legumes (lima beans, lentils, snow peas, fava beans, pinto peans, peas, garbanzo beans), sauerkraut, onions, olives, pickles ? - Fruit:  avocados, bananas, citrus fruit (orange, lemon, grapefruit), mango ? - Other:  Frozen meals, macaroni and cheese ?Routine exercise ?Stay adequately hydrated (aim for 64 oz water daily) ?Keep headache diary ?Maintain proper stress management ?Maintain proper sleep hygiene ?Do not skip meals ?Consider supplements:  magnesium citrate 400mg  daily, riboflavin 400mg  daily, coenzyme Q10 100mg  three times daily. ?14. Follow up 4 to 5 months. ?

## 2022-05-02 ENCOUNTER — Other Ambulatory Visit: Payer: Self-pay | Admitting: Neurology

## 2022-06-11 ENCOUNTER — Ambulatory Visit: Payer: Commercial Managed Care - PPO | Admitting: Neurology

## 2022-06-14 DIAGNOSIS — M791 Myalgia, unspecified site: Secondary | ICD-10-CM | POA: Diagnosis not present

## 2022-06-14 DIAGNOSIS — Z20818 Contact with and (suspected) exposure to other bacterial communicable diseases: Secondary | ICD-10-CM | POA: Diagnosis not present

## 2022-06-14 DIAGNOSIS — J0300 Acute streptococcal tonsillitis, unspecified: Secondary | ICD-10-CM | POA: Diagnosis not present
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
## 2022-08-12 ENCOUNTER — Encounter: Payer: Self-pay | Admitting: Neurology

## 2022-08-12 ENCOUNTER — Ambulatory Visit: Payer: Medicaid Other | Admitting: Neurology

## 2022-08-12 VITALS — BP 117/79 | HR 72 | Ht 62.0 in | Wt 212.2 lb

## 2022-08-12 DIAGNOSIS — G44219 Episodic tension-type headache, not intractable: Secondary | ICD-10-CM

## 2022-08-12 DIAGNOSIS — G43009 Migraine without aura, not intractable, without status migrainosus: Secondary | ICD-10-CM

## 2022-08-12 MED ORDER — NORTRIPTYLINE HCL 25 MG PO CAPS: 25.0000 mg | ORAL_CAPSULE | Freq: Every day | ORAL | 5 refills | Status: DC

## 2022-08-12 NOTE — Patient Instructions (Signed)
Increase nortriptyline to 25mg  at bedtime Continue rizatriptan and ondansetron as needed Limit use of pain relievers to no more than 2 days out of week to prevent risk of rebound or medication-overuse headache. Keep headache diary

## 2022-08-18 ENCOUNTER — Encounter: Payer: Self-pay | Admitting: Neurology

## 2022-09-16 DIAGNOSIS — K047 Periapical abscess without sinus: Secondary | ICD-10-CM | POA: Diagnosis not present

## 2022-12-10 ENCOUNTER — Ambulatory Visit: Payer: Medicaid Other

## 2023-01-13 ENCOUNTER — Encounter: Payer: Self-pay | Admitting: Neurology

## 2023-01-14 ENCOUNTER — Telehealth: Payer: Self-pay

## 2023-01-14 NOTE — Telephone Encounter (Signed)
Per Patient she was never Diagnose of having seizures. But she know she had one this time and in the past.    Per Patient she has had the type of episode in the past. ( NO [facility] visit then as well) For the episode last week she notice feeling dizzy and everything went black. Per Patient friend in the back ground patient starting shaking her eye rolled in the back of her head it took about 20 seconds for the patient to come back to.  Per Patient she could hear everyone around her but she could response.  Afterward per patient she was helped by her friend to her room where she stayed and slept all day until the next day.
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
## 2023-01-21 ENCOUNTER — Encounter: Payer: Self-pay | Admitting: Neurology

## 2023-01-21 ENCOUNTER — Ambulatory Visit: Payer: Medicaid Other | Admitting: Neurology

## 2023-01-21 VITALS — BP 113/79 | HR 80 | Ht 62.0 in | Wt 202.8 lb

## 2023-01-21 DIAGNOSIS — R55 Syncope and collapse: Secondary | ICD-10-CM | POA: Diagnosis not present

## 2023-01-21 NOTE — Patient Instructions (Signed)
Sounds like you passed out rather than seizures.  We will still check EEG.  However, you should follow up with your primary care provider for further evaluation of dizziness and passing out.

## 2023-01-27 ENCOUNTER — Ambulatory Visit: Payer: Medicaid Other | Admitting: Neurology

## 2023-01-29 ENCOUNTER — Ambulatory Visit: Payer: Medicaid Other | Admitting: Neurology

## 2023-01-29 DIAGNOSIS — R55 Syncope and collapse: Secondary | ICD-10-CM | POA: Diagnosis not present

## 2023-01-29 NOTE — Progress Notes (Signed)
EEG complete - results pending 

## 2023-01-30 DIAGNOSIS — N6324 Unspecified lump in the left breast, lower inner quadrant: Secondary | ICD-10-CM | POA: Diagnosis not present

## 2023-01-30 DIAGNOSIS — Z124 Encounter for screening for malignant neoplasm of cervix: Secondary | ICD-10-CM | POA: Diagnosis not present

## 2023-01-30 DIAGNOSIS — Z30431 Encounter for routine checking of intrauterine contraceptive device: Secondary | ICD-10-CM | POA: Diagnosis not present

## 2023-01-30 DIAGNOSIS — Z0001 Encounter for general adult medical examination with abnormal findings: Secondary | ICD-10-CM | POA: Diagnosis not present

## 2023-01-30 DIAGNOSIS — Z0000 Encounter for general adult medical examination without abnormal findings: Secondary | ICD-10-CM | POA: Diagnosis not present

## 2023-01-30 DIAGNOSIS — Z6841 Body Mass Index (BMI) 40.0 and over, adult: Secondary | ICD-10-CM | POA: Diagnosis not present

## 2023-01-30 DIAGNOSIS — N926 Irregular menstruation, unspecified: Secondary | ICD-10-CM | POA: Diagnosis not present

## 2023-01-30 DIAGNOSIS — Z113 Encounter for screening for infections with a predominantly sexual mode of transmission: Secondary | ICD-10-CM | POA: Diagnosis not present

## 2023-01-30 NOTE — Procedures (Signed)
ELECTROENCEPHALOGRAM REPORT  Date of Study: 01/29/2023  Patient's Name: Tina [name] MRN: [id number] Date of Birth: [birth year]   Clinical History: 32 year old female presents with episode of passing out with shaking  Medications: Current Outpatient Medications on File Prior to Visit  Medication Sig Dispense Refill   acetaminophen (TYLENOL) 325 MG tablet Take 2 tablets (650 mg total) by mouth every 4 (four) hours as needed (for pain scale < 4).     levonorgestrel (MIRENA, 52 MG,) 20 MCG/DAY IUD      No current facility-administered medications on file prior to visit.    Technical Summary: A multichannel digital EEG recording measured by the international 10-20 system with electrodes applied with paste and impedances below 5000 ohms performed in our laboratory with EKG monitoring in an awake and asleep patient.  Hyperventilation and photic stimulation were performed.  The digital EEG was referentially recorded, reformatted, and digitally filtered in a variety of bipolar and referential montages for optimal display.    Description: The patient is awake and asleep during the recording.  During maximal wakefulness, there is a symmetric, medium voltage 11 Hz posterior dominant rhythm that attenuates with eye opening.  The record is symmetric.  During drowsiness and sleep, there is an increase in theta slowing of the background.  State 2 sleep was briefly seen.  Hyperventilation and photic stimulation did not elicit any abnormalities.  There were no epileptiform discharges or electrographic seizures seen.    EKG lead was unremarkable.  Impression: This awake and asleep EEG is normal.    Clinical Correlation: A normal EEG does not exclude a clinical diagnosis of epilepsy.  If further clinical questions remain, prolonged EEG may be helpful.  Clinical correlation is advised.   Metta Clines, DO

## 2023-02-12 ENCOUNTER — Ambulatory Visit: Payer: Medicaid Other | Admitting: Neurology

## 2023-02-17 DIAGNOSIS — R928 Other abnormal and inconclusive findings on diagnostic imaging of breast: Secondary | ICD-10-CM | POA: Diagnosis not present

## 2023-02-17 DIAGNOSIS — N6320 Unspecified lump in the left breast, unspecified quadrant: Secondary | ICD-10-CM | POA: Diagnosis not present

## 2023-02-18 ENCOUNTER — Ambulatory Visit: Payer: Medicaid Other | Attending: Physician Assistant | Admitting: Physician Assistant

## 2023-02-18 ENCOUNTER — Other Ambulatory Visit: Payer: Self-pay

## 2023-02-18 ENCOUNTER — Encounter: Payer: Self-pay | Admitting: Physician Assistant

## 2023-02-18 ENCOUNTER — Ambulatory Visit: Payer: Medicaid Other | Admitting: Physician Assistant

## 2023-02-18 VITALS — BP 121/77 | HR 64 | Wt 200.4 lb

## 2023-02-18 DIAGNOSIS — R350 Frequency of micturition: Secondary | ICD-10-CM | POA: Insufficient documentation

## 2023-02-18 DIAGNOSIS — R42 Dizziness and giddiness: Secondary | ICD-10-CM | POA: Diagnosis not present

## 2023-02-18 DIAGNOSIS — Z131 Encounter for screening for diabetes mellitus: Secondary | ICD-10-CM | POA: Diagnosis not present

## 2023-02-18 DIAGNOSIS — R55 Syncope and collapse: Secondary | ICD-10-CM | POA: Diagnosis not present

## 2023-02-18 LAB — POCT URINALYSIS DIP (CLINITEK)
Bilirubin, UA: NEGATIVE
Blood, UA: NEGATIVE
Glucose, UA: NEGATIVE mg/dL
Leukocytes, UA: NEGATIVE
Nitrite, UA: NEGATIVE
POC PROTEIN,UA: NEGATIVE
Spec Grav, UA: >=1.03 — AB (ref 1.010–1.025)
Urobilinogen, UA: 0.2 E.U./dL
pH, UA: 6 (ref 5.0–8.0)

## 2023-02-18 NOTE — Progress Notes (Signed)
Patient ID: Tina [name], female   DOB: 1991-10-07, 32 y.o.   MRN: [id number]    Tina [name], is a 32 y.o. female  [id number]  [id number]  DOB - 05-04-1991  Chief Complaint  Patient presents with   Loss of Consciousness       Subjective:   Tina [name] is a 32 y.o. female here today for a follow up visit after being advised to see her PCP Patient c/o 2 episodes of "passing out" in January 2024. She was worked up by neurology with no definitve findings and was told to follow up here. No labs since 2022. No CP/SOB. She has been having dizziness. She drinks some water. She says one episode was witnessed and she slumped into someone's arms and was out a couple of seconds and responded to her name. It sounds as though the other episode was not witnessed and she had tunnel vision but doesn't remember what happened next but she did not fall down or hit her head. Both episodes in January. No FH early cardiac issues or sudden death.     No problems updated.  ALLERGIES: No Known Allergies  PAST MEDICAL HISTORY: Past Medical History:  Diagnosis Date   Preterm labor     MEDICATIONS AT HOME: Prior to Admission medications   Medication Sig Start Date End Date Taking? Authorizing Provider  levonorgestrel (MIRENA, 52 MG,) 20 MCG/DAY IUD    Yes [provider]  acetaminophen (TYLENOL) 325 MG tablet Take 2 tablets (650 mg total) by mouth every 4 (four) hours as needed (for pain scale < 4). Patient not taking: Reported on 02/18/2023 09/10/21   Holshouser, Joelene Millin B, CNM    ROS: Neg HEENT Neg resp Neg cardiac Neg GI Neg GU Neg MS Neg psych  Objective:   Vitals:   02/18/23 1529  BP: 121/77  Pulse: 64  SpO2: 100%  Weight: 200 lb 6.4 oz (90.9 kg)   Exam General appearance : Awake, alert, not in any distress. Speech Clear. Not toxic looking HEENT: Atraumatic and Normocephalic Neck: Supple, no JVD. No cervical lymphadenopathy.  Chest: Good air entry bilaterally,  CTAB.  No rales/rhonchi/wheezing CVS: S1 S2 regular, no murmurs.  Extremities: B/L Lower Ext shows no edema, both legs are warm to touch Neurology: Awake alert, and oriented X 3, CN II-XII intact, Non focal Skin: No Rash  Data Review Lab Results  Component Value Date   HGBA1C 5.2 11/27/2021    Assessment & Plan   1. Dizziness Increase water intake.  EKG with NSR, no arrhythmia, no ST changes.   - Comprehensive metabolic panel - CBC with Differential/Platelet - TSH - Hemoglobin A1c - Ambulatory referral to Cardiology  2. Urinary frequency - Hemoglobin A1c - POCT URINALYSIS DIP (CLINITEK)  3. Syncope, unspecified syncope type Warnings to call 911 if this occurs again.  She verbalized understanding - Comprehensive metabolic panel - CBC with Differential/Platelet - TSH - Ambulatory referral to Cardiology  4. Screening for diabetes mellitus - Hemoglobin A1c    Return in about 1 month (around 03/19/2023) for [place] for chronic conditions.  The patient was given clear instructions to go to ER or return to medical center if symptoms don't improve, worsen or new problems develop. The patient verbalized understanding. The patient was told to call to get lab results if they haven't heard anything in the next week.      Freeman Caldron, PA-C [facility] and [facility], [place]   02/18/2023, 5:00 PM

## 2023-02-18 NOTE — Patient Instructions (Addendum)
Drink 80-100 ounces water daily  syncope, Adult  Syncope is when you pass out or faint for a short time. It is caused by a sudden decrease in blood flow to the brain. This can happen for many reasons. It can sometimes happen when seeing blood, getting a shot (injection), or having pain or strong emotions. Most causes of fainting are not dangerous, but in some cases it can be a sign of a serious medical problem. If you faint, get help right away. Call your local emergency services (911 in the [place] Follow these instructions at home: Watch for any changes in your symptoms. Take these actions to stay safe and help with your symptoms: Knowing when you may be about to faint Signs that you may be about to faint include: Feeling dizzy or light-headed. It may feel like the room is spinning. Feeling weak. Feeling like you may vomit (nauseous). Seeing spots or seeing all white or all black. Having cold, clammy skin. Feeling warm and sweaty. Hearing ringing in the ears. If you start to feel like you might faint, sit or lie down right away. If sitting, lower your head down between your legs. If lying down, raise (elevate) your feet above the level of your heart. Breathe deeply and steadily. Wait until all of the symptoms are gone. Have someone stay with you until you feel better. Medicines Take over-the-counter and prescription medicines only as told by your doctor. If you are taking blood pressure or heart medicine, sit up and stand up slowly. Spend a few minutes getting ready to sit and then stand. This can help you feel less dizzy. Lifestyle Do not drive, use machinery, or play sports until your doctor says it is okay. Do not drink alcohol. Do not smoke or use any products that contain nicotine or tobacco. If you need help quitting, ask your doctor. Avoid hot tubs and saunas. General instructions Talk with your doctor about your symptoms. You may need to have testing to help find the  cause. Drink enough fluid to keep your pee (urine) pale yellow. Avoid standing for a long time. If you must stand for a long time, do movements such as: Moving your legs. Crossing your legs. Flexing and stretching your leg muscles. Squatting. Keep all follow-up visits. Contact a doctor if: You have episodes of near fainting. Get help right away if: You pass out or faint. You hit your head or are injured after fainting. You have any of these symptoms: Fast or uneven heartbeats (palpitations). Pain in your chest, belly, or back. Shortness of breath. You have jerky movements that you cannot control (seizure). You have a very bad headache. You are confused. You have problems with how you see (vision). You are very weak. You have trouble walking. You are bleeding from your mouth or your butt (rectum). You have black or tarry poop (stool). These symptoms may be an emergency. Get help right away. Call your local emergency services (911 in the [place] Do not wait to see if the symptoms will go away. Do not drive yourself to the hospital. Summary Syncope is when you pass out or faint for a short time. It is caused by a sudden decrease in blood flow to the brain. Signs that you may be about to faint include feeling dizzy or light-headed, feeling like you may vomit, seeing all white or all black, or having cold, clammy skin. If you start to feel like you might faint, sit or lie down right away. Lower your  head if sitting, or raise (elevate) your feet if lying down. Breathe deeply and steadily. Wait until all of the symptoms are gone. This information is not intended to replace advice given to you by your health care provider. Make sure you discuss any questions you have with your health care provider. Document Revised: 04/25/2021 Document Reviewed: 04/25/2021 Elsevier Patient Education  [place].

## 2023-02-19 DIAGNOSIS — N6324 Unspecified lump in the left breast, lower inner quadrant: Secondary | ICD-10-CM | POA: Diagnosis not present

## 2023-02-19 DIAGNOSIS — R92333 Mammographic heterogeneous density, bilateral breasts: Secondary | ICD-10-CM | POA: Diagnosis not present

## 2023-02-19 LAB — COMPREHENSIVE METABOLIC PANEL
ALT: 16 IU/L (ref 0–32)
AST: 13 IU/L (ref 0–40)
Albumin/Globulin Ratio: 1.6 (ref 1.2–2.2)
Albumin: 4.5 g/dL (ref 3.9–4.9)
Alkaline Phosphatase: 62 IU/L (ref 44–121)
BUN/Creatinine Ratio: 18 (ref 9–23)
BUN: 12 mg/dL (ref 6–20)
Bilirubin Total: 0.6 mg/dL (ref 0.0–1.2)
CO2: 23 mmol/L (ref 20–29)
Calcium: 9.8 mg/dL (ref 8.7–10.2)
Chloride: 103 mmol/L (ref 96–106)
Creatinine, Ser: 0.68 mg/dL (ref 0.57–1.00)
Globulin, Total: 2.8 g/dL (ref 1.5–4.5)
Glucose: 80 mg/dL (ref 70–99)
Potassium: 4.1 mmol/L (ref 3.5–5.2)
Sodium: 141 mmol/L (ref 134–144)
Total Protein: 7.3 g/dL (ref 6.0–8.5)
eGFR: 119 mL/min/{1.73_m2} (ref >59)

## 2023-02-19 LAB — CBC WITH DIFFERENTIAL/PLATELET
Basophils Absolute: 0 10*3/uL (ref 0.0–0.2)
Basos: 0 %
EOS (ABSOLUTE): 0.1 10*3/uL (ref 0.0–0.4)
Eos: 1 %
Hematocrit: 41.3 % (ref 34.0–46.6)
Hemoglobin: 13.6 g/dL (ref 11.1–15.9)
Immature Grans (Abs): 0 10*3/uL (ref 0.0–0.1)
Immature Granulocytes: 0 %
Lymphocytes Absolute: 3.5 10*3/uL — ABNORMAL HIGH (ref 0.7–3.1)
Lymphs: 37 %
MCH: 31.6 pg (ref 26.6–33.0)
MCHC: 32.9 g/dL (ref 31.5–35.7)
MCV: 96 fL (ref 79–97)
Monocytes Absolute: 0.5 10*3/uL (ref 0.1–0.9)
Monocytes: 5 %
Neutrophils Absolute: 5.3 10*3/uL (ref 1.4–7.0)
Neutrophils: 57 %
Platelets: 226 10*3/uL (ref 150–450)
RBC: 4.31 x10E6/uL (ref 3.77–5.28)
RDW: 12 % (ref 11.7–15.4)
WBC: 9.4 10*3/uL (ref 3.4–10.8)

## 2023-02-19 LAB — TSH: TSH: 0.657 u[IU]/mL (ref 0.450–4.500)

## 2023-02-19 LAB — HEMOGLOBIN A1C
Est. average glucose Bld gHb Est-mCnc: 100 mg/dL
Hgb A1c MFr Bld: 5.1 % (ref 4.8–5.6)

## 2023-03-19 ENCOUNTER — Ambulatory Visit: Payer: Medicaid Other | Admitting: Internal Medicine

## 2023-03-31 ENCOUNTER — Encounter: Payer: Self-pay | Admitting: Family Medicine

## 2023-03-31 ENCOUNTER — Ambulatory Visit: Payer: Medicaid Other | Attending: Family Medicine | Admitting: Family Medicine

## 2023-03-31 VITALS — BP 114/72 | HR 68 | Ht 62.0 in | Wt 199.8 lb

## 2023-03-31 DIAGNOSIS — R42 Dizziness and giddiness: Secondary | ICD-10-CM

## 2023-03-31 DIAGNOSIS — H9190 Unspecified hearing loss, unspecified ear: Secondary | ICD-10-CM

## 2023-03-31 MED ORDER — MECLIZINE HCL 25 MG PO TABS: 25.0000 mg | ORAL_TABLET | Freq: Three times a day (TID) | ORAL | 1 refills | Status: DC | PRN

## 2023-03-31 NOTE — Progress Notes (Signed)
Subjective:  Patient ID: Tina [name], female    DOB: January 23, 1991  Age: 32 y.o. MRN: [id number]  CC: Hearing Problem   HPI Tina [name] is a 32 y.o. year old female with a history of migraines, tension headaches who presents today for an office visit.  Interval History:  She feels her hearing has decreased and it has been ongoing for a while. The dizziness is random and has occurred  with change in position and sometimes feels like the room is spinning. She also has some tinnitus. Sometimes has nausea. Sometimes has frontal sinus pressure no postnasal drip.  She has been evaluated by neurology after convulsive syncope and per notes this was more consistent with syncope rather than seizure.  EEG was normal.  She has upcoming appointment for cardiac workup for syncope.  She has not had any repeat episodes since this occurred in 12/2022. Past Medical History:  Diagnosis Date   Cyst of right ovary    Dizziness    Generalized headaches    Pain in pelvis    Preterm labor    Syncope    Tonsillitis     Past Surgical History:  Procedure Laterality Date   CESAREAN SECTION N/A 07/09/2015   Procedure: CESAREAN SECTION;  Surgeon: Everett Graff, MD;  Location: [facility];  Service: Obstetrics;  Laterality: N/A;   WISDOM TOOTH EXTRACTION  2015    Family History  Problem Relation Age of Onset   Diabetes Paternal Aunt    Alcohol abuse Maternal Grandfather    COPD Maternal Grandfather    Diabetes Paternal 2    Healthy Mother    Healthy Father    Heart disease Paternal Uncle        dad's "brothers past away from heart attacks"   Arthritis Neg Hx    Asthma Neg Hx    Birth defects Neg Hx    Depression Neg Hx    Drug abuse Neg Hx    Early death Neg Hx    Hearing loss Neg Hx    Hyperlipidemia Neg Hx    Hypertension Neg Hx    Kidney disease Neg Hx    Learning disabilities Neg Hx    Mental illness Neg Hx    Mental retardation Neg Hx    Miscarriages / Stillbirths Neg Hx     Stroke Neg Hx    Vision loss Neg Hx    Varicose Veins Neg Hx     Social History   Socioeconomic History   Marital status: Single    Spouse name: Not on file   Number of children: Not on file   Years of education: Not on file   Highest education level: Not on file  Occupational History   Not on file  Tobacco Use   Smoking status: Never   Smokeless tobacco: Never  Vaping Use   Vaping Use: Never used  Substance and Sexual Activity   Alcohol use: No   Drug use: No   Sexual activity: Yes    Birth control/protection: None  Other Topics Concern   Not on file  Social History Narrative   Not on file   Social Determinants of Health   Financial Resource Strain: Not on file  Food Insecurity: Not on file  Transportation Needs: Not on file  Physical Activity: Not on file  Stress: Not on file  Social Connections: Not on file    No Known Allergies  Outpatient Medications Prior to Visit  Medication Sig Dispense Refill   levonorgestrel (  MIRENA, 52 MG,) 20 MCG/DAY IUD      acetaminophen (TYLENOL) 325 MG tablet Take 2 tablets (650 mg total) by mouth every 4 (four) hours as needed (for pain scale < 4). (Patient not taking: Reported on 02/18/2023)     No facility-administered medications prior to visit.     ROS Review of Systems  Constitutional:  Negative for activity change and appetite change.  HENT:  Positive for hearing loss. Negative for sinus pressure and sore throat.   Respiratory:  Negative for chest tightness, shortness of breath and wheezing.   Cardiovascular:  Negative for chest pain and palpitations.  Gastrointestinal:  Negative for abdominal distention, abdominal pain and constipation.  Genitourinary: Negative.   Musculoskeletal: Negative.   Neurological:  Positive for dizziness.  Psychiatric/Behavioral:  Negative for behavioral problems and dysphoric mood.     Objective:  BP 114/72   Pulse 68   Ht 5\' 2"  (1.575 m)   Wt 199 lb 12.8 oz (90.6 kg)   SpO2 100%    BMI 36.54 kg/m      03/31/2023    9:30 AM 02/18/2023    3:29 PM 01/21/2023    8:43 AM  BP/Weight  Systolic BP [id number]  Diastolic BP 72 77 79  Wt. (Lbs) 199.8 200.4 202.8  BMI 36.54 kg/m2 36.65 kg/m2 37.09 kg/m2      Physical Exam Constitutional:      Appearance: She is well-developed.  HENT:     Right Ear: Tympanic membrane normal.     Left Ear: Tympanic membrane normal.     Ears:     Comments: Positive Dix-Hallpike maneuver Cardiovascular:     Rate and Rhythm: Normal rate.     Heart sounds: Normal heart sounds. No murmur heard. Pulmonary:     Effort: Pulmonary effort is normal.     Breath sounds: Normal breath sounds. No wheezing or rales.  Chest:     Chest wall: No tenderness.  Abdominal:     General: Bowel sounds are normal. There is no distension.     Palpations: Abdomen is soft. There is no mass.     Tenderness: There is no abdominal tenderness.  Musculoskeletal:        General: Normal range of motion.     Right lower leg: No edema.     Left lower leg: No edema.  Neurological:     Mental Status: She is alert and oriented to person, place, and time.  Psychiatric:        Mood and Affect: Mood normal.        Latest Ref Rng & Units 02/18/2023    4:36 PM 04/19/2021    5:53 PM 03/02/2020    4:50 PM  CMP  Glucose 70 - 99 mg/dL 80  93  89   BUN 6 - 20 mg/dL 12  7  7    Creatinine 0.57 - 1.00 mg/dL 0.68  0.66  0.78   Sodium 134 - 144 mmol/L 141  139  137   Potassium 3.5 - 5.2 mmol/L 4.1  3.7  3.5   Chloride 96 - 106 mmol/L 103  108  109   CO2 20 - 29 mmol/L 23  26  21    Calcium 8.7 - 10.2 mg/dL 9.8  9.0  9.2   Total Protein 6.0 - 8.5 g/dL 7.3  6.5  5.5   Total Bilirubin 0.0 - 1.2 mg/dL 0.6  0.3  0.5   Alkaline Phos 44 - 121 IU/L 62  50  125   AST 0 - 40 IU/L 13  11  20    ALT 0 - 32 IU/L 16  10  17      Lipid Panel  No results found for: "CHOL", "TRIG", "HDL", "CHOLHDL", "VLDL", "LDLCALC", "LDLDIRECT"  CBC    Component Value Date/Time   WBC 9.4  02/18/2023 1636   WBC 11.0 (H) 09/09/2021 0435   RBC 4.31 02/18/2023 1636   RBC 3.65 (L) 09/09/2021 0435   HGB 13.6 02/18/2023 1636   HCT 41.3 02/18/2023 1636   PLT 226 02/18/2023 1636   MCV 96 02/18/2023 1636   MCH 31.6 02/18/2023 1636   MCH 32.1 09/09/2021 0435   MCHC 32.9 02/18/2023 1636   MCHC 33.8 09/09/2021 0435   RDW 12.0 02/18/2023 1636   LYMPHSABS 3.5 (H) 02/18/2023 1636   MONOABS 0.7 04/19/2021 1753   EOSABS 0.1 02/18/2023 1636   BASOSABS 0.0 02/18/2023 1636    Lab Results  Component Value Date   HGBA1C 5.1 02/18/2023    Assessment & Plan:  1. Vertigo Counseled on adverse effects of meclizine Will treat with meclizine after shared decision making - meclizine (ANTIVERT) 25 MG tablet; Take 1 tablet (25 mg total) by mouth 3 (three) times daily as needed for dizziness.  Dispense: 60 tablet; Refill: 1  2. Hearing loss, unspecified hearing loss type, unspecified laterality Symptoms could be of sinus etiology versus Mnire's disease Will treat vertigo and if symptoms persist consider referral for audiology testing Vestibular rehab is also a consideration   Meds ordered this encounter  Medications   meclizine (ANTIVERT) 25 MG tablet    Sig: Take 1 tablet (25 mg total) by mouth 3 (three) times daily as needed for dizziness.    Dispense:  60 tablet    Refill:  1    Follow-up: Return in about 3 months (around 06/30/2023) for Chronic medical conditions.       Charlott Rakes, MD, FAAFP. [facility] and [facility], [place]   03/31/2023, 10:19 AM

## 2023-03-31 NOTE — Patient Instructions (Signed)
Vertigo Vertigo is the feeling that you or your surroundings are moving when they are not. This feeling can come and go at any time. Vertigo often goes away on its own. Vertigo can be dangerous if it occurs while you are doing something that could endanger yourself or others, such as driving or operating machinery. Your health care provider will do tests to try to determine the cause of your vertigo. Tests will also help your health care provider decide how best to treat your condition. Follow these instructions at home: Eating and drinking     Dehydration can make vertigo worse. Drink enough fluid to keep your urine pale yellow. Do not drink alcohol. Activity Return to your normal activities as told by your health care provider. Ask your health care provider what activities are safe for you. In the morning, first sit up on the side of the bed. When you feel okay, stand slowly while you hold onto something until you know that your balance is fine. Move slowly. Avoid sudden body or head movements or certain positions, as told by your health care provider. If you have trouble walking or keeping your balance, try using a cane for stability. If you feel dizzy or unstable, sit down right away. Avoid doing any tasks that would cause danger to you or others if vertigo occurs. Avoid bending down if you feel dizzy. Place items in your home so that they are easy for you to reach without bending or leaning over. Do not drive or use machinery if you feel dizzy. General instructions Take over-the-counter and prescription medicines only as told by your health care provider. Keep all follow-up visits. This is important. Contact a health care provider if: Your medicines do not relieve your vertigo or they make it worse. Your condition gets worse or you develop new symptoms. You have a fever. You develop nausea or vomiting, or if nausea gets worse. Your family or friends notice any behavioral changes. You  have numbness or a prickling and tingling sensation in part of your body. Get help right away if you: Are always dizzy or you faint. Develop severe headaches. Develop a stiff neck. Develop sensitivity to light. Have difficulty moving or speaking. Have weakness in your hands, arms, or legs. Have changes in your hearing or vision. These symptoms may represent a serious problem that is an emergency. Do not wait to see if the symptoms will go away. Get medical help right away. Call your local emergency services (911 in the [place] Do not drive yourself to the hospital. Summary Vertigo is the feeling that you or your surroundings are moving when they are not. Your health care provider will do tests to try to determine the cause of your vertigo. Follow instructions for home care. You may be told to avoid certain tasks, positions, or movements. Contact a health care provider if your medicines do not relieve your symptoms, or if you have a fever, nausea, vomiting, or changes in behavior. Get help right away if you have severe headaches or difficulty speaking, or you develop hearing or vision problems. This information is not intended to replace advice given to you by your health care provider. Make sure you discuss any questions you have with your health care provider. Document Revised: 11/14/2020 Document Reviewed: 11/14/2020 Elsevier Patient Education  2023 [employer] Inc[employer]  

## 2023-04-28 NOTE — Progress Notes (Unsigned)
Cardiology Office Note:    Date:  04/30/2023   ID:  Kerby Nora, DOB 1991/11/23, MRN [id number]  PCP:  Hoy Register, MD   [facility] Providers Cardiologist:  Christell Constant, MD     Referring MD: Hoy Register, MD   CC: Syncope eval Consulted for the evaluation of syncope at the behest of Dr. Alvis Lemmings  History of Present Illness:    Tina [name] is a 32 y.o. female with a hx of Morbid obesity, gestational HTN presenting for syncope evaluation.  Patient notes that she is feeling well.  Sometimes feels a bit dizzy but feels like no syncope.    Had near-syncopal episode while prolong standing washing dishes.  Her friend caught her during this spell (it was January 12th).  She has prodrome of near syncope and things going black. The second time (in February) with the room spinning. 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  Has had no chest pain, chest pressure, chest tightness, chest stinging . No shortness of breath, DOE .  No PND or orthopnea.  No weight gain, leg swelling , or abdominal swelling.  Notes  no palpitations or funny heart beats.     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   Things are stressful as the three year old has recent tonsillectomy. Drinks 4-5 bottles of water a day.    Past Medical History:  Diagnosis Date   Cyst of right ovary    Dizziness    Generalized headaches    Pain in pelvis    Preterm labor    Syncope    Tonsillitis     Past Surgical History:  Procedure Laterality Date   CESAREAN SECTION N/A 07/09/2015   Procedure: CESAREAN SECTION;  Surgeon: Osborn Coho, MD;  Location: [facility];  Service: Obstetrics;  Laterality: N/A;   WISDOM TOOTH EXTRACTION  2015    Current Medications: Current Meds  Medication Sig   levonorgestrel (MIRENA, 52 MG,) 20 MCG/DAY IUD    meclizine (ANTIVERT) 25 MG tablet Take 1 tablet (25 mg total) by mouth 3  (three) times daily as needed for dizziness.     Allergies:   Patient has no known allergies.   Social History   Socioeconomic History   Marital status: Single    Spouse name: Not on file   Number of children: Not on file   Years of education: Not on file   Highest education level: Not on file  Occupational History   Not on file  Tobacco Use   Smoking status: Never   Smokeless tobacco: Never  Vaping Use   Vaping Use: Never used  Substance and Sexual Activity   Alcohol use: No   Drug use: No   Sexual activity: Yes    Birth control/protection: None  Other Topics Concern   Not on file  Social History Narrative   Not on file   Social Determinants of Health   Financial Resource Strain: Not on file  Food Insecurity: Not on file  Transportation Needs: Not on file  Physical Activity: Not on file  Stress: Not on file  Social Connections: Not on file     Family History: The patient's family history includes Alcohol abuse in her maternal grandfather; COPD in her maternal grandfather; Diabetes in her paternal aunt and paternal grandmother; Healthy in her father and mother; Heart disease in her paternal uncle. There  is no history of Arthritis, Asthma, Birth defects, Depression, Drug abuse, Early death, Hearing loss, Hyperlipidemia, Hypertension, Kidney disease, Learning disabilities, Mental illness, Mental retardation, Miscarriages / Stillbirths, Stroke, Vision loss, or Varicose Veins.  ROS:   Please see the history of present illness.     All other systems reviewed and are negative.  EKGs/Labs/Other Studies Reviewed:    The following studies were reviewed today:  EKG:  EKG is  ordered today.  The ekg ordered today demonstrates  04/30/23: SR rate 61     Recent Labs: 02/18/2023: ALT 16; BUN 12; Creatinine, Ser 0.68; Hemoglobin 13.6; Platelets 226; Potassium 4.1; Sodium 141; TSH 0.657  Recent Lipid Panel No results found for: "CHOL", "TRIG", "HDL", "CHOLHDL", "VLDL",  "LDLCALC", "LDLDIRECT"   Physical Exam:    VS:  BP 111/62   Pulse 61   Ht 5\' 1"  (1.549 m)   Wt 197 lb (89.4 kg)   SpO2 98%   BMI 37.22 kg/m     Wt Readings from Last 3 Encounters:  04/30/23 197 lb (89.4 kg)  03/31/23 199 lb 12.8 oz (90.6 kg)  02/18/23 200 lb 6.4 oz (90.9 kg)    GEN:  Morbid Obesity, NAD HEENT: Normal NECK: No JVD CARDIAC: RRR, no murmurs, rubs, gallops RESPIRATORY:  Clear to auscultation without rales, wheezing or rhonchi  ABDOMEN: Soft, non-tender, non-distended MUSCULOSKELETAL:  No edema; No deformity  SKIN: Warm and dry NEUROLOGIC:  Alert and oriented x 3 PSYCHIATRIC:  Normal affect   ASSESSMENT:    1. Near syncope   2. Orthostatic hypotension    PLAN:    Syncope and near syncope - prodromal and clinical suggestive of orthostatic hypotension - Orthostatics normal today, she is symptomatic - did disease process education, discussed hydration and not fighting near syncope - will get echo   If structurally normal echo, PRN f/u         Medication Adjustments/Labs and Tests Ordered: Current medicines are reviewed at length with the patient today.  Concerns regarding medicines are outlined above.  Orders Placed This Encounter  Procedures   EKG 12-Lead   ECHOCARDIOGRAM COMPLETE   No orders of the defined types were placed in this encounter.   Patient Instructions  Medication Instructions:  Your physician recommends that you continue on your current medications as directed. Please refer to the Current Medication list given to you today.  *If you need a refill on your cardiac medications before your next appointment, please call your pharmacy*   Lab Work: NONE If you have labs (blood work) drawn today and your tests are completely normal, you will receive your results only by: MyChart Message (if you have MyChart) OR A paper copy in the mail If you have any lab test that is abnormal or we need to change your treatment, we will call you  to review the results.   Testing/Procedures: Your physician has requested that you have an echocardiogram. Echocardiography is a painless test that uses sound waves to create images of your heart. It provides your doctor with information about the size and shape of your heart and how well your heart's chambers and valves are working. This procedure takes approximately one hour. There are no restrictions for this procedure. Please do NOT wear cologne, perfume, aftershave, or lotions (deodorant is allowed). Please arrive 15 minutes prior to your appointment time.    Follow-Up:As Needed At [facility], you and your health needs are our priority.  As part of our continuing mission to provide  you with exceptional heart care, we have created designated Provider Care Teams.  These Care Teams include your primary Cardiologist (physician) and Advanced Practice Providers (APPs -  Physician Assistants and Nurse Practitioners) who all work together to provide you with the care you need, when you need it.    Provider:   Christell Constant, MD       Signed, Christell Constant, MD  04/30/2023 10:05 AM    [facility]

## 2023-04-30 ENCOUNTER — Encounter: Payer: Self-pay | Admitting: Internal Medicine

## 2023-04-30 ENCOUNTER — Ambulatory Visit: Payer: Medicaid Other | Attending: Internal Medicine | Admitting: Internal Medicine

## 2023-04-30 VITALS — BP 111/62 | HR 61 | Ht 61.0 in | Wt 197.0 lb

## 2023-04-30 DIAGNOSIS — I951 Orthostatic hypotension: Secondary | ICD-10-CM

## 2023-04-30 DIAGNOSIS — R55 Syncope and collapse: Secondary | ICD-10-CM

## 2023-04-30 NOTE — Patient Instructions (Signed)
Medication Instructions:  Your physician recommends that you continue on your current medications as directed. Please refer to the Current Medication list given to you today.  *If you need a refill on your cardiac medications before your next appointment, please call your pharmacy*   Lab Work: NONE If you have labs (blood work) drawn today and your tests are completely normal, you will receive your results only by: MyChart Message (if you have MyChart) OR A paper copy in the mail If you have any lab test that is abnormal or we need to change your treatment, we will call you to review the results.   Testing/Procedures: Your physician has requested that you have an echocardiogram. Echocardiography is a painless test that uses sound waves to create images of your heart. It provides your doctor with information about the size and shape of your heart and how well your heart's chambers and valves are working. This procedure takes approximately one hour. There are no restrictions for this procedure. Please do NOT wear cologne, perfume, aftershave, or lotions (deodorant is allowed). Please arrive 15 minutes prior to your appointment time.    Follow-Up:As Needed At [facility], you and your health needs are our priority.  As part of our continuing mission to provide you with exceptional heart care, we have created designated Provider Care Teams.  These Care Teams include your primary Cardiologist (physician) and Advanced Practice Providers (APPs -  Physician Assistants and Nurse Practitioners) who all work together to provide you with the care you need, when you need it.    Provider:   Mahesh A Chandrasekhar, MD      
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
## 2023-05-04 ENCOUNTER — Ambulatory Visit: Payer: Medicaid Other | Admitting: Neurology

## 2023-05-29 ENCOUNTER — Ambulatory Visit (HOSPITAL_COMMUNITY): Payer: Medicaid Other | Attending: Internal Medicine

## 2023-05-29 DIAGNOSIS — R55 Syncope and collapse: Secondary | ICD-10-CM | POA: Diagnosis not present

## 2023-05-29 LAB — ECHOCARDIOGRAM COMPLETE
Area-P 1/2: 3.21 cm2
S' Lateral: 2.2 cm

## 2023-06-30 ENCOUNTER — Ambulatory Visit: Payer: Medicaid Other | Admitting: Family Medicine
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
## 2023-07-16 ENCOUNTER — Encounter: Payer: Self-pay | Admitting: Physician Assistant

## 2023-07-16 ENCOUNTER — Ambulatory Visit: Payer: Medicaid Other | Attending: Physician Assistant | Admitting: Physician Assistant

## 2023-07-16 ENCOUNTER — Encounter: Payer: Self-pay | Admitting: Neurology

## 2023-07-16 ENCOUNTER — Ambulatory Visit: Payer: Medicaid Other | Admitting: Neurology

## 2023-07-16 ENCOUNTER — Other Ambulatory Visit: Payer: Medicaid Other

## 2023-07-16 VITALS — BP 115/68 | HR 63 | Wt 201.8 lb

## 2023-07-16 VITALS — BP 106/70 | HR 66 | Ht 61.0 in | Wt 202.0 lb

## 2023-07-16 DIAGNOSIS — L509 Urticaria, unspecified: Secondary | ICD-10-CM | POA: Diagnosis not present

## 2023-07-16 DIAGNOSIS — G44219 Episodic tension-type headache, not intractable: Secondary | ICD-10-CM

## 2023-07-16 DIAGNOSIS — R413 Other amnesia: Secondary | ICD-10-CM | POA: Diagnosis not present

## 2023-07-16 DIAGNOSIS — G43009 Migraine without aura, not intractable, without status migrainosus: Secondary | ICD-10-CM

## 2023-07-16 LAB — VITAMIN B12: Vitamin B-12: 461 pg/mL (ref 211–911)

## 2023-07-16 MED ORDER — NORTRIPTYLINE HCL 25 MG PO CAPS: 25.0000 mg | ORAL_CAPSULE | Freq: Every day | ORAL | 5 refills | Status: DC

## 2023-07-16 MED ORDER — RIZATRIPTAN BENZOATE 10 MG PO TABS: 10.0000 mg | ORAL_TABLET | ORAL | 5 refills | Status: DC | PRN

## 2023-07-16 NOTE — Patient Instructions (Addendum)
Zyrtec.  Over the counter hydrocortisone   Hives Hives are itchy, red, swollen areas on your skin. They can show up on any part of your body. They often go away within 24 hours (acute hives). If you get new hives after the old ones fade and this goes on for many days or weeks, it is called chronic hives. Hives do not spread from person to person (are not contagious). Hives can happen when your body reacts to something that you are allergic to (allergen). These are sometimes called triggers. You can get hives right after being around a trigger, or hours later. What are the causes? Food allergies. Insect bites or stings. Allergies to pollen or pets. Spending time in sunlight, heat, or cold. Exercise. Stress. Other causes, such as: Viruses. This includes the common cold. Infections caused by germs (bacteria). Some medicines. Chemicals or latex. Allergy shots. Blood transfusions. In some cases, the cause is not known. What increases the risk? Being female. Being allergic to foods, such as: Citrus fruits. Milk. Eggs. Peanuts. Tree nuts. Shellfish. Being allergic to: Medicines. Latex. Insects. Animals. Pollen. What are the signs or symptoms?  Itchy, red or white bumps or spots on your skin. These areas may: Swell and get bigger. Change in shape and location. Stand alone or connect to each other over a large area of skin. Sting or hurt. Turn white when pressed in the center (blanch). In very bad cases, your hands, feet, and face may also swell. This may happen if hives start deeper in your skin. How is this treated? Treatment for hives depends on your symptoms. You may need to: Use cool, wet cloths (cool compresses) or take cool showers to stop the itching. Take or apply medicines to: Help with itching (antihistamines). Lessen swelling (corticosteroids). Treat infection (antibiotics). Have a medicine called omalizumab given to you as a shot. You may need this if your  hives do not get better with other treatments. In very bad cases, you may need to use a device filled with medicine that gives an emergency shot of epinephrine (auto-injector pen) to stop a very bad allergic reaction (anaphylactic reaction). Follow these instructions at home: Medicines Take or apply over-the-counter and prescription medicines only as told by your doctor. If you were prescribed antibiotics, use them as told by your doctor. Do not stop using them even if you start to feel better. Skin care Put cool, wet cloths on the hives. Do not scratch your skin. Do not rub your skin. General instructions Do not take hot showers or baths. This can make itching worse. Do not wear tight clothes. Use sunscreen. Wear clothes that cover your skin when you are outside. Avoid triggers that cause your hives. Keep a journal to help track what causes your hives. Write down: What medicines you take. What you eat and drink. What you put on your skin. Keep all follow-up visits. Your doctor will need to make sure treatment is working. Contact a doctor if: Your symptoms do not get better with medicine. Your joints hurt or swell. You have a fever. You have pain in your belly (abdomen). Get help right away if: Your tongue or lips swell. Your eyelids are swollen. Your chest or throat feels tight. You have trouble breathing or swallowing. These symptoms may be an emergency. Get help right away. Call 911. Do not wait to see if the symptoms will go away. Do not drive yourself to the hospital. This information is not intended to replace advice given to  you by your health care provider. Make sure you discuss any questions you have with your health care provider. Document Revised: 09/02/2022 Document Reviewed: 09/02/2022 Elsevier Patient Education  2024 [employer].

## 2023-07-16 NOTE — Progress Notes (Signed)
Patient ID: Tina [name], female   DOB: 1991/03/22, 32 y.o.   MRN: [id number]   Tina [name], is a 32 y.o. female  [id number]  [id number]  DOB - 04-Jan-1991  Chief Complaint  Patient presents with   Migraine       Subjective:   Tina [name] is a 32 y.o. female here today for hives on chest after showering or sweating.  Sometimes on arms too.  This has been going on for several months.  No new soaps or detergents.  It usu goes away within about an hour.  It does itch at times.  She did f/up with cardiology and neurology  No problems updated.  ALLERGIES: No Known Allergies  PAST MEDICAL HISTORY: Past Medical History:  Diagnosis Date   Cyst of right ovary    Dizziness    Generalized headaches    Pain in pelvis    Preterm labor    Syncope    Tonsillitis     MEDICATIONS AT HOME: Prior to Admission medications   Medication Sig Start Date End Date Taking? Authorizing Provider  levonorgestrel (MIRENA, 52 MG,) 20 MCG/DAY IUD    Yes [provider]  meclizine (ANTIVERT) 25 MG tablet Take 1 tablet (25 mg total) by mouth 3 (three) times daily as needed for dizziness. 03/31/23  Yes Hoy Register, MD  nortriptyline (PAMELOR) 25 MG capsule Take 1 capsule (25 mg total) by mouth at bedtime. 07/16/23  Yes Jaffe, Adam R, DO  rizatriptan (MAXALT) 10 MG tablet Take 1 tablet (10 mg total) by mouth as needed for migraine (may repeat after 2 hours.  Maximum 2 tablets in 24 hours). May repeat in 2 hours if needed 07/16/23  Yes Jaffe, Adam R, DO    ROS: Neg HEENT Neg resp Neg cardiac Neg GI Neg GU Neg MS Neg psych Neg neuro  Objective:   Vitals:   07/16/23 1459  BP: 115/68  Pulse: 63  SpO2: 96%  Weight: 201 lb 12.8 oz (91.5 kg)   Exam General appearance : Awake, alert, not in any distress. Speech Clear. Not toxic looking HEENT: Atraumatic and Normocephalic Neck: Supple, no JVD. No cervical lymphadenopathy.  Chest: Good air entry bilaterally, CTAB.  No  rales/rhonchi/wheezing CVS: S1 S2 regular, no murmurs.  Abdomen: Bowel sounds present, Non tender and not distended with no gaurding, rigidity or rebound. Extremities: B/L Lower Ext shows no edema, both legs are warm to touch Neurology: Awake alert, and oriented X 3, CN II-XII intact, Non focal Skin: currently has no rash  Data Review Lab Results  Component Value Date   HGBA1C 5.1 02/18/2023   HGBA1C 5.2 11/27/2021    Assessment & Plan   1. Urticaria Zyrtec-1 daily and 1%OTC hydrocortisone cream    Return if symptoms worsen or fail to improve.  The patient was given clear instructions to go to ER or return to medical center if symptoms don't improve, worsen or new problems develop. The patient verbalized understanding. The patient was told to call to get lab results if they haven't heard anything in the next week.      Georgian Co, PA-C [facility] [place], [place] [phone]   07/16/2023, 3:45 PM

## 2023-07-16 NOTE — Patient Instructions (Signed)
Continue nortriptyline 25mg  at bedtime Rizatriptan 10mg  as needed. Limit use of pain relievers to no more than 2 days out of week to prevent risk of rebound or medication-overuse headache. Check B12 level Keep headache diary Follow up 6 months.

## 2023-10-07 ENCOUNTER — Ambulatory Visit: Payer: Medicaid Other | Admitting: Family Medicine

## 2023-10-07 ENCOUNTER — Encounter: Payer: Self-pay | Admitting: Family Medicine

## 2023-10-19 DIAGNOSIS — F411 Generalized anxiety disorder: Secondary | ICD-10-CM | POA: Diagnosis not present

## 2023-10-29 DIAGNOSIS — F331 Major depressive disorder, recurrent, moderate: Secondary | ICD-10-CM | POA: Diagnosis not present

## 2023-11-05 DIAGNOSIS — F331 Major depressive disorder, recurrent, moderate: Secondary | ICD-10-CM | POA: Diagnosis not present

## 2023-11-09 ENCOUNTER — Encounter: Payer: Self-pay | Admitting: Family Medicine

## 2023-11-09 ENCOUNTER — Ambulatory Visit: Payer: Medicaid Other | Attending: Family Medicine | Admitting: Family Medicine

## 2023-11-09 VITALS — BP 107/70 | HR 60 | Ht 61.0 in | Wt 199.4 lb

## 2023-11-09 DIAGNOSIS — T148XXA Other injury of unspecified body region, initial encounter: Secondary | ICD-10-CM | POA: Diagnosis not present

## 2023-11-09 DIAGNOSIS — L659 Nonscarring hair loss, unspecified: Secondary | ICD-10-CM

## 2023-11-09 DIAGNOSIS — N6452 Nipple discharge: Secondary | ICD-10-CM

## 2023-11-09 NOTE — Progress Notes (Signed)
Subjective:  Patient ID: Tina [name], female    DOB: Aug 25, 1991  Age: 32 y.o. MRN: [id number]  CC: Alopecia (Body bruise easily )   HPI Barry Agosta is a 32 y.o. year old female with a history of migraines, tension headaches who presents today for an office visit.   Interval History: Discussed the use of AI scribe software for clinical note transcription with the patient, who gave verbal consent to proceed.   The patient, a mother of a [age]-year-old, presents with hair loss and easy bruising. She noticed the hair loss a couple of weeks ago, describing it as significant and more than usual coming out instrument. She denies any recent changes in hair products, scalp itching, or rash, but admits to having dandruff. She has been using Selsun Blue to manage the dandruff.  In addition to the hair loss, the patient has been experiencing easy bruising. She recently had a large bruise on her right right thigh, but could not recall any trauma that could have caused it. She also notices smaller bruises on her body without any known cause.  At the moment she has no bruises.  She denies any family history of bleeding disorders and does not bleed excessively when cut.  The patient also mentions occasional joint pain, particularly in the knee, which she has to "pop out of place" if she's been on it for a long time. However, she denies any joint swelling.  Regarding her migraines, they are currently managed by a neurologist with nortriptyline and Maxalt. She had a headache a few days ago, but overall, the headaches have not been too severe.  Lastly, the patient mentions that she is still producing breast milk, despite having stopped breastfeeding her two-year-old child. She is unsure of how to stop the milk production.        Past Medical History:  Diagnosis Date   Cyst of right ovary    Dizziness    Generalized headaches    Pain in pelvis    Preterm labor    Syncope    Tonsillitis      Past Surgical History:  Procedure Laterality Date   CESAREAN SECTION N/A 07/09/2015   Procedure: CESAREAN SECTION;  Surgeon: Osborn Coho, MD;  Location: [facility];  Service: Obstetrics;  Laterality: N/A;   WISDOM TOOTH EXTRACTION  2015    Family History  Problem Relation Age of Onset   Diabetes Paternal Aunt    Alcohol abuse Maternal Grandfather    COPD Maternal Grandfather    Diabetes Paternal Grandmother    Healthy Mother    Healthy Father    Heart disease Paternal Uncle        dad's "brothers past away from heart attacks"   Arthritis Neg Hx    Asthma Neg Hx    Birth defects Neg Hx    Depression Neg Hx    Drug abuse Neg Hx    Early death Neg Hx    Hearing loss Neg Hx    Hyperlipidemia Neg Hx    Hypertension Neg Hx    Kidney disease Neg Hx    Learning disabilities Neg Hx    Mental illness Neg Hx    Mental retardation Neg Hx    Miscarriages / Stillbirths Neg Hx    Stroke Neg Hx    Vision loss Neg Hx    Varicose Veins Neg Hx     Social History   Socioeconomic History   Marital status: Single    Spouse name: Not  on file   Number of children: Not on file   Years of education: Not on file   Highest education level: Not on file  Occupational History   Not on file  Tobacco Use   Smoking status: Never   Smokeless tobacco: Never  Vaping Use   Vaping status: Never Used  Substance and Sexual Activity   Alcohol use: No   Drug use: No   Sexual activity: Yes    Birth control/protection: None  Other Topics Concern   Not on file  Social History Narrative   Not on file   Social Determinants of Health   Financial Resource Strain: Not on file  Food Insecurity: Not on file  Transportation Needs: Not on file  Physical Activity: Not on file  Stress: Not on file  Social Connections: Not on file    No Known Allergies  Outpatient Medications Prior to Visit  Medication Sig Dispense Refill   levonorgestrel (MIRENA, 52 MG,) 20 MCG/DAY IUD      meclizine  (ANTIVERT) 25 MG tablet Take 1 tablet (25 mg total) by mouth 3 (three) times daily as needed for dizziness. 60 tablet 1   nortriptyline (PAMELOR) 25 MG capsule Take 1 capsule (25 mg total) by mouth at bedtime. 30 capsule 5   rizatriptan (MAXALT) 10 MG tablet Take 1 tablet (10 mg total) by mouth as needed for migraine (may repeat after 2 hours.  Maximum 2 tablets in 24 hours). May repeat in 2 hours if needed 10 tablet 5   No facility-administered medications prior to visit.     ROS Review of Systems  Constitutional:  Negative for activity change and appetite change.  HENT:  Negative for sinus pressure and sore throat.   Respiratory:  Negative for chest tightness, shortness of breath and wheezing.   Cardiovascular:  Negative for chest pain and palpitations.  Gastrointestinal:  Negative for abdominal distention, abdominal pain and constipation.  Genitourinary: Negative.   Musculoskeletal: Negative.   Hematological:  Bruises/bleeds easily.  Psychiatric/Behavioral:  Negative for behavioral problems and dysphoric mood.     Objective:  BP 107/70   Pulse 60   Ht 5\' 1"  (1.549 m)   Wt 199 lb 6.4 oz (90.4 kg)   SpO2 100%   Breastfeeding No   BMI 37.68 kg/m      11/09/2023   10:02 AM 07/16/2023    2:59 PM 07/16/2023   11:55 AM  BP/Weight  Systolic BP 107 115 106  Diastolic BP 70 68 70  Wt. (Lbs) 199.4 201.8 202  BMI 37.68 kg/m2 38.13 kg/m2 38.17 kg/m2      Physical Exam Constitutional:      Appearance: She is well-developed.  Cardiovascular:     Rate and Rhythm: Normal rate.     Heart sounds: Normal heart sounds. No murmur heard. Pulmonary:     Effort: Pulmonary effort is normal.     Breath sounds: Normal breath sounds. No wheezing or rales.  Chest:     Chest wall: No tenderness.  Abdominal:     General: Bowel sounds are normal. There is no distension.     Palpations: Abdomen is soft. There is no mass.     Tenderness: There is no abdominal tenderness.  Musculoskeletal:         General: Normal range of motion.     Right lower leg: No edema.     Left lower leg: No edema.  Neurological:     Mental Status: She is alert and oriented to  person, place, and time.  Psychiatric:        Mood and Affect: Mood normal.        Latest Ref Rng & Units 02/18/2023    4:36 PM 04/19/2021    5:53 PM 03/02/2020    4:50 PM  CMP  Glucose 70 - 99 mg/dL 80  93  89   BUN 6 - 20 mg/dL 12  7  7    Creatinine 0.57 - 1.00 mg/dL 4.27  0.62  3.76   Sodium 134 - 144 mmol/L 141  139  137   Potassium 3.5 - 5.2 mmol/L 4.1  3.7  3.5   Chloride 96 - 106 mmol/L 103  108  109   CO2 20 - 29 mmol/L 23  26  21    Calcium 8.7 - 10.2 mg/dL 9.8  9.0  9.2   Total Protein 6.0 - 8.5 g/dL 7.3  6.5  5.5   Total Bilirubin 0.0 - 1.2 mg/dL 0.6  0.3  0.5   Alkaline Phos 44 - 121 IU/L 62  50  125   AST 0 - 40 IU/L 13  11  20    ALT 0 - 32 IU/L 16  10  17      Lipid Panel  No results found for: "CHOL", "TRIG", "HDL", "CHOLHDL", "VLDL", "LDLCALC", "LDLDIRECT"  CBC    Component Value Date/Time   WBC 9.4 02/18/2023 1636   WBC 11.0 (H) 09/09/2021 0435   RBC 4.31 02/18/2023 1636   RBC 3.65 (L) 09/09/2021 0435   HGB 13.6 02/18/2023 1636   HCT 41.3 02/18/2023 1636   PLT 226 02/18/2023 1636   MCV 96 02/18/2023 1636   MCH 31.6 02/18/2023 1636   MCH 32.1 09/09/2021 0435   MCHC 32.9 02/18/2023 1636   MCHC 33.8 09/09/2021 0435   RDW 12.0 02/18/2023 1636   LYMPHSABS 3.5 (H) 02/18/2023 1636   MONOABS 0.7 04/19/2021 1753   EOSABS 0.1 02/18/2023 1636   BASOSABS 0.0 02/18/2023 1636    Lab Results  Component Value Date   HGBA1C 5.1 02/18/2023    Assessment & Plan:      Hair Loss Recent onset of hair loss over the past few weeks. No recent change in hair products, scalp itching, or rash. Dandruff present. It is possible that this could be postpartum due to having a child not long ago.  She denies any recent stressors and is not on any form of chemotherapy or new medications. -Order thyroid level and  complete blood count to rule out potential causes of hair loss. -Recommend over-the-counter biotin for hair and nail health.  Easy Bruising Recent unexplained bruising without known trauma. No family history of bleeding disorders. No prolonged bleeding with cuts. -Order coagulation panel to rule out bleeding disorders and will also evaluate for autoimmune condition.  Headaches Recent headache, but overall well-managed by neurologist with nortriptyline and Maxalt. -Continue current management under neurologist's care.  Lactation Continued lactation despite cessation of breastfeeding two years ago. Reassurance provided.         No orders of the defined types were placed in this encounter.   Follow-up: Return in about 1 year (around 11/08/2024), or if symptoms worsen or fail to improve, for CPE/ Preventive Health Exam.       Hoy Register, MD, FAAFP. [facility] [place], [place] [phone]   11/09/2023, 12:56 PM

## 2023-11-09 NOTE — Patient Instructions (Signed)
VISIT SUMMARY:  Today, we discussed your recent hair loss, easy bruising, occasional joint pain, and continued lactation. We also reviewed your current management for headaches.  YOUR PLAN:  -HAIR LOSS: Hair loss can be caused by various factors, including hormonal changes, nutritional deficiencies, or underlying health conditions. We will check your thyroid levels and complete blood count to identify any potential causes. In the meantime, you can take over-the-counter biotin to support hair and nail health.  -EASY BRUISING: Easy bruising can occur due to various reasons, including potential bleeding disorders. We will perform a coagulation panel to rule out any bleeding disorders.  -HEADACHES: Your headaches are currently well-managed with the medications prescribed by your neurologist. Please continue with your current treatment plan.  -LACTATION: Continued lactation after stopping breastfeeding can sometimes happen. At this time, no medical intervention is needed.  INSTRUCTIONS:  Please complete the lab tests for thyroid levels, complete blood count, and coagulation panel as soon as possible. Follow up with your neurologist for ongoing headache management. If you have any new symptoms or concerns, please schedule an appointment.

## 2023-11-12 LAB — CBC WITH DIFFERENTIAL/PLATELET
Basophils Absolute: 0 10*3/uL (ref 0.0–0.2)
Basos: 0 %
EOS (ABSOLUTE): 0 10*3/uL (ref 0.0–0.4)
Eos: 1 %
Hematocrit: 43.4 % (ref 34.0–46.6)
Hemoglobin: 13.9 g/dL (ref 11.1–15.9)
Immature Grans (Abs): 0 10*3/uL (ref 0.0–0.1)
Immature Granulocytes: 0 %
Lymphocytes Absolute: 3.4 10*3/uL — ABNORMAL HIGH (ref 0.7–3.1)
Lymphs: 43 %
MCH: 31.2 pg (ref 26.6–33.0)
MCHC: 32 g/dL (ref 31.5–35.7)
MCV: 98 fL — ABNORMAL HIGH (ref 79–97)
Monocytes Absolute: 0.4 10*3/uL (ref 0.1–0.9)
Monocytes: 5 %
Neutrophils Absolute: 4 10*3/uL (ref 1.4–7.0)
Neutrophils: 51 %
Platelets: 222 10*3/uL (ref 150–450)
RBC: 4.45 x10E6/uL (ref 3.77–5.28)
RDW: 11.9 % (ref 11.7–15.4)
WBC: 7.8 10*3/uL (ref 3.4–10.8)

## 2023-11-12 LAB — ANA,IFA RA DIAG PNL W/RFLX TIT/PATN
ANA Titer 1: NEGATIVE
Cyclic Citrullin Peptide Ab: 6 U (ref 0–19)
Rheumatoid fact SerPl-aCnc: <10 [IU]/mL (ref <14.0)

## 2023-11-12 LAB — TSH: TSH: 0.906 u[IU]/mL (ref 0.450–4.500)

## 2023-11-12 LAB — PT AND PTT
INR: 1 (ref 0.9–1.2)
Prothrombin Time: 11.4 s (ref 9.1–12.0)
aPTT: 26 s (ref 24–33)

## 2023-11-12 LAB — C-REACTIVE PROTEIN: CRP: <1 mg/L (ref 0–10)

## 2023-11-12 LAB — ANTI-DNA ANTIBODY, DOUBLE-STRANDED: dsDNA Ab: 4 [IU]/mL (ref 0–9)

## 2023-11-12 LAB — T3: T3, Total: 94 ng/dL (ref 71–180)

## 2023-11-12 LAB — T4, FREE: Free T4: 1.18 ng/dL (ref 0.82–1.77)

## 2023-11-12 LAB — SEDIMENTATION RATE: Sed Rate: 2 mm/h (ref 0–32)

## 2023-11-13 DIAGNOSIS — F331 Major depressive disorder, recurrent, moderate: Secondary | ICD-10-CM | POA: Diagnosis not present

## 2023-11-20 DIAGNOSIS — F331 Major depressive disorder, recurrent, moderate: Secondary | ICD-10-CM | POA: Diagnosis not present

## 2023-11-27 DIAGNOSIS — F331 Major depressive disorder, recurrent, moderate: Secondary | ICD-10-CM | POA: Diagnosis not present

## 2023-12-04 DIAGNOSIS — F331 Major depressive disorder, recurrent, moderate: Secondary | ICD-10-CM | POA: Diagnosis not present

## 2023-12-08 DIAGNOSIS — H5213 Myopia, bilateral: Secondary | ICD-10-CM | POA: Diagnosis not present

## 2023-12-10 DIAGNOSIS — F331 Major depressive disorder, recurrent, moderate: Secondary | ICD-10-CM | POA: Diagnosis not present

## 2023-12-18 DIAGNOSIS — F331 Major depressive disorder, recurrent, moderate: Secondary | ICD-10-CM | POA: Diagnosis not present

## 2023-12-21 ENCOUNTER — Ambulatory Visit: Payer: Medicaid Other | Admitting: Physician Assistant

## 2023-12-25 DIAGNOSIS — F331 Major depressive disorder, recurrent, moderate: Secondary | ICD-10-CM | POA: Diagnosis not present

## 2023-12-31 DIAGNOSIS — F331 Major depressive disorder, recurrent, moderate: Secondary | ICD-10-CM | POA: Diagnosis not present

## 2024-01-01 DIAGNOSIS — F331 Major depressive disorder, recurrent, moderate: Secondary | ICD-10-CM | POA: Diagnosis not present

## 2024-01-08 DIAGNOSIS — F331 Major depressive disorder, recurrent, moderate: Secondary | ICD-10-CM | POA: Diagnosis not present

## 2024-01-13 ENCOUNTER — Ambulatory Visit: Payer: Medicaid Other | Admitting: Physician Assistant

## 2024-01-15 DIAGNOSIS — F331 Major depressive disorder, recurrent, moderate: Secondary | ICD-10-CM | POA: Diagnosis not present
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
## 2024-01-22 ENCOUNTER — Ambulatory Visit: Payer: Medicaid Other | Admitting: Neurology

## 2024-01-22 ENCOUNTER — Encounter: Payer: Self-pay | Admitting: Neurology

## 2024-01-22 VITALS — BP 112/68 | HR 78 | Ht 62.0 in | Wt 191.2 lb

## 2024-01-22 DIAGNOSIS — G44219 Episodic tension-type headache, not intractable: Secondary | ICD-10-CM | POA: Diagnosis not present

## 2024-01-22 DIAGNOSIS — G43009 Migraine without aura, not intractable, without status migrainosus: Secondary | ICD-10-CM | POA: Diagnosis not present

## 2024-01-22 MED ORDER — NORTRIPTYLINE HCL 50 MG PO CAPS: 50.0000 mg | ORAL_CAPSULE | Freq: Every day | ORAL | 5 refills | Status: DC

## 2024-01-22 MED ORDER — ONDANSETRON HCL 4 MG PO TABS: 4.0000 mg | ORAL_TABLET | Freq: Three times a day (TID) | ORAL | 5 refills | Status: DC | PRN

## 2024-01-22 MED ORDER — RIZATRIPTAN BENZOATE 10 MG PO TABS: 10.0000 mg | ORAL_TABLET | ORAL | 5 refills | Status: DC | PRN

## 2024-01-22 NOTE — Patient Instructions (Signed)
Increase nortriptyline to 50mg  at bedtime.  If no improvement in 2 months, contact me and we can increase dose. Rizatriptan and ondansetron as needed for migraines Aleve for tension headaches Limit use of pain relievers to no more than 10 days a month to prevent risk of rebound or medication-overuse headache. Keep headache diary Follow up 6 months.

## 2024-01-28 DIAGNOSIS — F331 Major depressive disorder, recurrent, moderate: Secondary | ICD-10-CM | POA: Diagnosis not present

## 2024-02-05 DIAGNOSIS — F331 Major depressive disorder, recurrent, moderate: Secondary | ICD-10-CM | POA: Diagnosis not present

## 2024-02-10 ENCOUNTER — Ambulatory Visit: Payer: Medicaid Other | Admitting: Family Medicine

## 2024-02-11 DIAGNOSIS — F331 Major depressive disorder, recurrent, moderate: Secondary | ICD-10-CM | POA: Diagnosis not present

## 2024-02-14 ENCOUNTER — Other Ambulatory Visit: Payer: Self-pay | Admitting: Neurology

## 2024-02-18 DIAGNOSIS — F331 Major depressive disorder, recurrent, moderate: Secondary | ICD-10-CM | POA: Diagnosis not present

## 2024-02-24 ENCOUNTER — Ambulatory Visit: Payer: Medicaid Other | Attending: Physician Assistant | Admitting: Physician Assistant

## 2024-02-24 VITALS — BP 112/76 | HR 76 | Temp 98.0°F | Ht 62.0 in | Wt 200.0 lb

## 2024-02-24 DIAGNOSIS — R350 Frequency of micturition: Secondary | ICD-10-CM | POA: Diagnosis not present

## 2024-02-24 DIAGNOSIS — I7300 Raynaud's syndrome without gangrene: Secondary | ICD-10-CM | POA: Diagnosis not present

## 2024-02-24 DIAGNOSIS — R102 Pelvic and perineal pain unspecified side: Secondary | ICD-10-CM

## 2024-02-24 DIAGNOSIS — F4321 Adjustment disorder with depressed mood: Secondary | ICD-10-CM | POA: Diagnosis not present

## 2024-02-24 DIAGNOSIS — Z3202 Encounter for pregnancy test, result negative: Secondary | ICD-10-CM | POA: Diagnosis not present

## 2024-02-24 DIAGNOSIS — R5383 Other fatigue: Secondary | ICD-10-CM | POA: Diagnosis not present

## 2024-02-24 DIAGNOSIS — F32A Depression, unspecified: Secondary | ICD-10-CM | POA: Diagnosis not present

## 2024-02-24 LAB — POCT URINALYSIS DIP (CLINITEK)
Bilirubin, UA: NEGATIVE
Blood, UA: NEGATIVE
Glucose, UA: NEGATIVE mg/dL
Ketones, POC UA: NEGATIVE mg/dL
Leukocytes, UA: NEGATIVE
Nitrite, UA: NEGATIVE
POC PROTEIN,UA: NEGATIVE
Spec Grav, UA: 1.02 (ref 1.010–1.025)
Urobilinogen, UA: 0.2 U/dL
pH, UA: 6 (ref 5.0–8.0)

## 2024-02-24 LAB — POCT URINE PREGNANCY: Preg Test, Ur: NEGATIVE

## 2024-02-24 MED ORDER — AMLODIPINE BESYLATE 2.5 MG PO TABS: 2.5000 mg | ORAL_TABLET | Freq: Every day | ORAL | 1 refills | Status: DC

## 2024-02-24 MED ORDER — SERTRALINE HCL 50 MG PO TABS: 50.0000 mg | ORAL_TABLET | Freq: Every day | ORAL | 3 refills | Status: DC
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
## 2024-02-24 NOTE — Patient Instructions (Addendum)
 Drink 64 to 80 ounces water daily.  Continue to choose nutritious foods.  Raynaud's Phenomenon  Raynaud's phenomenon is a condition that affects the blood vessels (arteries) that carry blood to the fingers and toes. The arteries that supply blood to the ears, lips, nipples, or the tip of the nose might also be affected. Raynaud's phenomenon causes the arteries to become narrow temporarily (spasm). As a result, the flow of blood to the affected areas is temporarily decreased. This usually occurs in response to cold temperatures or stress. During an attack, the skin in the affected areas turns white, then blue, and finally red. A person may also feel tingling or numbness in those areas. Attacks usually last for only a brief period, and then the blood flow to the area returns to normal. In most cases, Raynaud's phenomenon does not cause serious health problems. What are the causes? In many cases, the cause of this condition is not known. The condition may occur on its own (primary Raynaud's phenomenon) or may be associated with other diseases or factors (secondary Raynaud's phenomenon). Possible causes may include: Diseases or medical conditions that damage the arteries. Injuries and repetitive actions that hurt the hands or feet. Being exposed to certain chemicals. Taking medicines that narrow the arteries. Other medical conditions, such as lupus, scleroderma, rheumatoid arthritis, thyroid problems, blood disorders, Sjogren syndrome, or atherosclerosis. What increases the risk? The following factors may make you more likely to develop this condition: Being 65-56 years old. Being female. Having a family history of Raynaud's phenomenon. Living in a cold climate. Smoking. What are the signs or symptoms? Symptoms of this condition usually occur when you are exposed to cold temperatures or when you have emotional stress. The symptoms may last for a few minutes or up to several hours. They usually  affect your fingers but may also affect your toes, nipples, lips, ears, or the tip of your nose. Symptoms may include: Changes in skin color. The skin in the affected areas will turn pale or white. The skin may then change from white to bluish to red as normal blood flow returns to the area. Numbness, tingling, or pain in the affected areas. In severe cases, symptoms may include: Skin sores. Tissues decaying and dying (gangrene). How is this diagnosed? This condition may be diagnosed based on: Your symptoms and medical history. A physical exam. During the exam, you may be asked to put your hands in cold water to check for a reaction to cold temperature. Tests, such as: Blood tests to check for other diseases or conditions. A test to check the movement of blood through your arteries and veins (vascular ultrasound). A test in which the skin at the base of your fingernail is examined under a microscope (nailfold capillaroscopy). How is this treated? During an episode, you can take actions to help symptoms go away faster. Options include moving your arms around in a windmill pattern, warming your fingers under warm water, or placing your fingers in a warm body fold, such as your armpit. Long-term treatment for this condition often involves making lifestyle changes and taking steps to control your exposure to cold temperature. For more severe cases, medicine (calcium channel blockers) may be used to improve blood circulation. Follow these instructions at home: Avoiding cold temperatures Take these steps to avoid exposure to cold: If possible, stay indoors during cold weather. When you go outside during cold weather, dress in layers and wear mittens, a hat, a scarf, and warm footwear. Wear mittens or  gloves when handling ice or frozen food. Use holders for glasses or cans containing cold drinks. Let warm water run for a while before taking a shower or bath. Warm up the car before driving in cold  weather. Lifestyle If possible, avoid stressful and emotional situations. Try to find ways to manage your stress, such as: Exercise. Yoga. Meditation. Biofeedback. Do not use any products that contain nicotine or tobacco. These products include cigarettes, chewing tobacco, and vaping devices, such as e-cigarettes. If you need help quitting, ask your health care provider. Avoid secondhand smoke. Limit your use of caffeine. Switch to decaffeinated coffee, tea, and soda. Avoid chocolate. Avoid vibrating tools and machinery. General instructions Protect your hands and feet from injuries, cuts, or bruises. Avoid wearing tight rings or wristbands. Wear loose fitting socks and comfortable, roomy shoes. Take over-the-counter and prescription medicines only as told by your health care provider. Where to find support Raynaud's Association: www.raynauds.org Where to find more information [employer] of Arthritis and Musculoskeletal and Skin Diseases: www.niams.http://www.myers.net/ Contact a health care provider if: Your discomfort becomes worse despite lifestyle changes. You develop sores on your fingers or toes that do not heal. You have breaks in the skin on your fingers or toes. You have a fever. You have pain or swelling in your joints. You have a rash. Your symptoms occur on only one side of your body. Get help right away if: Your fingers or toes turn black. You have severe pain in the affected areas. These symptoms may represent a serious problem that is an emergency. Do not wait to see if the symptoms will go away. Get medical help right away. Call your local emergency services (911 in the [place] Do not drive yourself to the hospital. Summary Raynaud's phenomenon is a condition that affects the arteries that carry blood to the fingers, toes, ears, lips, nipples, or the tip of the nose. In many cases, the cause of this condition is not known. Symptoms of this condition include changes in  skin color along with numbness and tingling in the affected area. Treatment for this condition includes lifestyle changes and reducing exposure to cold temperatures. Medicines may be used for severe cases of the condition. Contact your health care provider if your condition worsens despite treatment. This information is not intended to replace advice given to you by your health care provider. Make sure you discuss any questions you have with your health care provider. Document Revised: 02/19/2021 Document Reviewed: 02/19/2021 Elsevier Patient Education  2024 [employer].

## 2024-02-25 ENCOUNTER — Other Ambulatory Visit: Payer: Self-pay | Admitting: Physician Assistant

## 2024-02-25 ENCOUNTER — Encounter: Payer: Self-pay | Admitting: Physician Assistant

## 2024-02-25 LAB — VITAMIN D 25 HYDROXY (VIT D DEFICIENCY, FRACTURES): Vit D, 25-Hydroxy: 25.8 ng/mL — ABNORMAL LOW (ref 30.0–100.0)

## 2024-02-25 MED ORDER — VITAMIN D (ERGOCALCIFEROL) 1.25 MG (50000 UNIT) PO CAPS: 50000.0000 [IU] | ORAL_CAPSULE | ORAL | 1 refills | Status: DC

## 2024-02-26 ENCOUNTER — Telehealth (INDEPENDENT_AMBULATORY_CARE_PROVIDER_SITE_OTHER): Payer: Self-pay

## 2024-02-26 DIAGNOSIS — F331 Major depressive disorder, recurrent, moderate: Secondary | ICD-10-CM | POA: Diagnosis not present

## 2024-02-26 NOTE — Telephone Encounter (Signed)
-----   Message from Georgian Co sent at 02/25/2024  6:33 PM EST ----- your vitamin D is low but better than before! This can contribute to muscle aches, anxiety, fatigue, and depression.  I have sent a prescription to the pharmacy for you to take once a week take this weekly for 2 months. After 2 months,  take 2000 units vitamin D daily. You can buy that over the counter.  We will recheck in 3 months or so.  Thanks, Georgian Co, PA-C

## 2024-02-26 NOTE — Telephone Encounter (Signed)
 Patient viewed results and Doctor comment through [facility]

## 2024-02-29 ENCOUNTER — Ambulatory Visit
Admission: RE | Admit: 2024-02-29 | Discharge: 2024-02-29 | Disposition: A | Discharge status: Home / Self Care | Payer: Medicaid Other | Source: Ambulatory Visit | Attending: Physician Assistant | Admitting: Physician Assistant

## 2024-02-29 DIAGNOSIS — N888 Other specified noninflammatory disorders of cervix uteri: Secondary | ICD-10-CM | POA: Diagnosis not present

## 2024-02-29 DIAGNOSIS — R350 Frequency of micturition: Secondary | ICD-10-CM | POA: Diagnosis not present

## 2024-02-29 DIAGNOSIS — Z90710 Acquired absence of both cervix and uterus: Secondary | ICD-10-CM | POA: Diagnosis not present

## 2024-02-29 DIAGNOSIS — R102 Pelvic and perineal pain: Secondary | ICD-10-CM

## 2024-03-01 ENCOUNTER — Telehealth: Payer: Self-pay

## 2024-03-01 ENCOUNTER — Encounter: Payer: Self-pay | Admitting: Physician Assistant

## 2024-03-01 NOTE — Telephone Encounter (Signed)
 error

## 2024-03-04 DIAGNOSIS — F331 Major depressive disorder, recurrent, moderate: Secondary | ICD-10-CM | POA: Diagnosis not present

## 2024-03-10 DIAGNOSIS — F331 Major depressive disorder, recurrent, moderate: Secondary | ICD-10-CM | POA: Diagnosis not present

## 2024-03-17 ENCOUNTER — Other Ambulatory Visit: Payer: Self-pay | Admitting: Physician Assistant

## 2024-03-18 ENCOUNTER — Ambulatory Visit (HOSPITAL_COMMUNITY)
Admission: EM | Admit: 2024-03-18 | Discharge: 2024-03-18 | Disposition: A | Discharge status: Home / Self Care | Attending: Family Medicine | Admitting: Family Medicine

## 2024-03-18 ENCOUNTER — Encounter (HOSPITAL_COMMUNITY): Payer: Self-pay

## 2024-03-18 DIAGNOSIS — B001 Herpesviral vesicular dermatitis: Secondary | ICD-10-CM

## 2024-03-18 DIAGNOSIS — K047 Periapical abscess without sinus: Secondary | ICD-10-CM | POA: Diagnosis not present

## 2024-03-18 MED ORDER — VALACYCLOVIR HCL 1 G PO TABS: 2000.0000 mg | ORAL_TABLET | Freq: Two times a day (BID) | ORAL | 1 refills | Status: AC

## 2024-03-18 MED ORDER — KETOROLAC TROMETHAMINE 10 MG PO TABS: 10.0000 mg | ORAL_TABLET | Freq: Four times a day (QID) | ORAL | 0 refills | Status: DC | PRN

## 2024-03-18 MED ORDER — KETOROLAC TROMETHAMINE 30 MG/ML IJ SOLN
INTRAMUSCULAR | Status: AC
  Filled 2024-03-18: qty 1

## 2024-03-18 MED ORDER — KETOROLAC TROMETHAMINE 30 MG/ML IJ SOLN
30.0000 mg | Freq: Once | INTRAMUSCULAR | Status: AC
  Administered 2024-03-18: 30 mg via INTRAMUSCULAR

## 2024-03-18 MED ORDER — AMOXICILLIN-POT CLAVULANATE 875-125 MG PO TABS
1.0000 | ORAL_TABLET | Freq: Two times a day (BID) | ORAL | 0 refills | Status: AC
Start: 2024-03-18 — End: 2024-03-25

## 2024-03-18 NOTE — ED Provider Notes (Signed)
 [facility]    CSN: [id number] Arrival date & time: 03/18/24  7616      History   Chief Complaint Chief Complaint  Patient presents with   Dental Problem    HPI Tina [name] is a 33 y.o. female.   HPI Here for pain in her left lower anterior teeth and jaw.  This has been bothering her for about a week and got worse yesterday.  She also has had a fever blister on her left upper lip for the last 6 to 7 days also. No cough or cold symptoms  Her left ear has bothered her some  NKDA  She has an IUD and has not had a period in a long time.   Past Medical History:  Diagnosis Date   Cyst of right ovary    Dizziness    Generalized headaches    Pain in pelvis    Preterm labor    Syncope    Tonsillitis     Patient Active Problem List   Diagnosis Date Noted   Near syncope 04/30/2023   Chronic migraine without aura without status migrainosus, not intractable 02/24/2022   Tinea pedis 02/24/2022   Cervical high risk HPV (human papillomavirus) test positive 02/22/2022   History of premature delivery 02/22/2022   Genital herpes simplex 02/22/2022   Morbid obesity (HCC) 02/22/2022   Vaginal discharge 10/17/2021   VBAC, delivered, 9/11 09/09/2021   Normal labor 09/08/2021   Gestational hypertension 03/04/2020   Normal postpartum course 03/02/2020   Previous cesarean section 09/16/2016   BMI 40.0-44.9, adult (HCC) 09/16/2016   HPV test positive 09/16/2016   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 Authorizing Provider  amoxicillin-clavulanate (AUGMENTIN) 875-125 MG tablet Take 1 tablet by mouth 2 (two) times daily for 7 days. 03/18/24 03/25/24 Yes Zenia Resides, MD  ketorolac (TORADOL) 10 MG tablet Take 1 tablet (10 mg total) by mouth every 6 (six) hours as needed (pain). 03/18/24  Yes Aedyn Mckeon, Janace Aris, MD  levonorgestrel (MIRENA, 52 MG,) 20 MCG/DAY IUD    Yes [provider]  valACYclovir (VALTREX) 1000 MG tablet Take 2 tablets (2,000 mg total) by mouth 2 (two) times daily for 2 doses. 03/18/24 03/19/24 Yes Zenia Resides, MD    Family History Family History  Problem Relation Age of Onset   Diabetes Paternal Aunt    Alcohol abuse Maternal Grandfather    COPD Maternal Grandfather    Diabetes Paternal Grandmother    Healthy Mother    Healthy Father    Heart disease Paternal Uncle        dad's "brothers past away from heart attacks"   Arthritis Neg Hx    Asthma Neg  Hx    Birth defects Neg Hx    Depression Neg Hx    Drug abuse Neg Hx    Early death Neg Hx    Hearing loss Neg Hx    Hyperlipidemia Neg Hx    Hypertension Neg Hx    Kidney disease Neg Hx    Learning disabilities Neg Hx    Mental illness Neg Hx    Mental retardation Neg Hx    Miscarriages / Stillbirths Neg Hx    Stroke Neg Hx    Vision loss Neg Hx    Varicose Veins Neg Hx     Social History Social History   Tobacco Use   Smoking status: Never   Smokeless tobacco: Never  Vaping Use   Vaping status: Never Used  Substance Use Topics   Alcohol use: No   Drug use: No     Allergies   Patient has no known allergies.   Review of Systems Review of Systems   Physical Exam Triage Vital Signs ED Triage Vitals [03/18/24 0908]  Encounter Vitals Group     BP (!) 107/57     Systolic BP Percentile      Diastolic BP Percentile      Pulse Rate 75     Resp 16     Temp 97.8 F (36.6 C)     Temp Source Oral     SpO2 99 %      Weight      Height      Head Circumference      Peak Flow      Pain Score      Pain Loc      Pain Education      Exclude from Growth Chart    No data found.  Updated Vital Signs BP (!) 107/57 (BP Location: Left Arm)   Pulse 75   Temp 97.8 F (36.6 C) (Oral)   Resp 16   SpO2 99%   Visual Acuity Right Eye Distance:   Left Eye Distance:   Bilateral Distance:    Right Eye Near:   Left Eye Near:    Bilateral Near:     Physical Exam Vitals reviewed.  Constitutional:      General: She is not in acute distress.    Appearance: She is not toxic-appearing.  HENT:     Right Ear: Tympanic membrane and ear canal normal.     Left Ear: Tympanic membrane and ear canal normal.     Nose: Nose normal.     Mouth/Throat:     Mouth: Mucous membranes are moist.     Pharynx: No oropharyngeal exudate or posterior oropharyngeal erythema.     Comments: A little swelling of her left lower lip on the chin.  No fluctuance or erythema there.  It is mildly tender there  In the mouth there are some dental caries of the lower left dental ridge Eyes:     Extraocular Movements: Extraocular movements intact.     Conjunctiva/sclera: Conjunctivae normal.     Pupils: Pupils are equal, round, and reactive to light.  Cardiovascular:     Rate and Rhythm: Normal rate and regular rhythm.     Heart sounds: No murmur heard. Pulmonary:     Effort: Pulmonary effort is normal. No respiratory distress.     Breath sounds: No stridor. No wheezing, rhonchi or rales.  Musculoskeletal:     Cervical back: Neck supple.  Lymphadenopathy:     Cervical: No cervical adenopathy.  Skin:    Capillary Refill: Capillary refill takes less than 2 seconds.     Coloration: Skin is not jaundiced or pale.  Neurological:     General: No focal deficit present.     Mental Status: She is alert and oriented to person, place, and time.  Psychiatric:        Behavior: Behavior normal.      UC Treatments / Results  Labs (all  labs ordered are listed, but only abnormal results are displayed) Labs Reviewed - No data to display  EKG   Radiology No results found.  Procedures Procedures (including critical care time)  Medications Ordered in UC Medications  ketorolac (TORADOL) 30 MG/ML injection 30 mg (has no administration in time range)    Initial Impression / Assessment and Plan / UC Course  I have reviewed the triage vital signs and the nursing notes.  Pertinent labs & imaging results that were available during my care of the patient were reviewed by me and considered in my medical decision making (see chart for details).     Toradol injection is given here and Toradol tablets are sent to the pharmacy for pain.  Augmentin is sent in for dental infection and Valtrex is sent in for the oral herpes, in case it will help.  She does have a dentist and plans to make a follow-up appointment with them Final Clinical Impressions(s) / UC Diagnoses   Final diagnoses:  Dental infection  Fever blister     Discharge Instructions      Take amoxicillin-clavulanate 875 mg--1 tab twice daily with food for 7 days  You have been given a shot of Toradol 30 mg today.  Ketorolac 10 mg tablets--take 1 tablet every 6 hours as needed for pain.  This is the same medicine that is in the shot we just gave you  Take valacyclovir 1000 mg--2 tablets by mouth every 12 hours for 2 doses.        ED Prescriptions     Medication Sig Dispense Auth. Provider   amoxicillin-clavulanate (AUGMENTIN) 875-125 MG tablet Take 1 tablet by mouth 2 (two) times daily for 7 days. 14 tablet Sequoia Witz, Janace Aris, MD   ketorolac (TORADOL) 10 MG tablet Take 1 tablet (10 mg total) by mouth every 6 (six) hours as needed (pain). 20 tablet Brysan Mcevoy, Janace Aris, MD   valACYclovir (VALTREX) 1000 MG tablet Take 2 tablets (2,000 mg total) by mouth 2 (two) times daily for 2 doses. 4 tablet Treylin Burtch, Janace Aris, MD      PDMP not reviewed this  encounter.   Zenia Resides, MD 03/18/24 [phone]

## 2024-03-18 NOTE — ED Triage Notes (Signed)
 Patient is here for dental pain that started yesterday.

## 2024-03-18 NOTE — Discharge Instructions (Signed)
 Take amoxicillin-clavulanate 875 mg--1 tab twice daily with food for 7 days  You have been given a shot of Toradol 30 mg today.  Ketorolac 10 mg tablets--take 1 tablet every 6 hours as needed for pain.  This is the same medicine that is in the shot we just gave you  Take valacyclovir 1000 mg--2 tablets by mouth every 12 hours for 2 doses.

## 2024-03-23 ENCOUNTER — Ambulatory Visit: Payer: Medicaid Other | Attending: Physician Assistant | Admitting: Physician Assistant

## 2024-03-23 ENCOUNTER — Encounter: Payer: Self-pay | Admitting: Physician Assistant

## 2024-03-23 VITALS — BP 105/63 | HR 70 | Temp 98.2°F | Resp 16 | Wt 203.0 lb

## 2024-03-23 DIAGNOSIS — E559 Vitamin D deficiency, unspecified: Secondary | ICD-10-CM

## 2024-03-23 DIAGNOSIS — M25561 Pain in right knee: Secondary | ICD-10-CM | POA: Diagnosis not present

## 2024-03-23 DIAGNOSIS — I7300 Raynaud's syndrome without gangrene: Secondary | ICD-10-CM | POA: Diagnosis not present

## 2024-03-23 DIAGNOSIS — F4321 Adjustment disorder with depressed mood: Secondary | ICD-10-CM

## 2024-03-23 MED ORDER — SERTRALINE HCL 50 MG PO TABS: 50.0000 mg | ORAL_TABLET | Freq: Every day | ORAL | 3 refills | Status: DC

## 2024-03-23 MED ORDER — AMLODIPINE BESYLATE 2.5 MG PO TABS: 2.5000 mg | ORAL_TABLET | Freq: Every day | ORAL | 1 refills | Status: DC

## 2024-03-23 MED ORDER — VITAMIN D (ERGOCALCIFEROL) 1.25 MG (50000 UNIT) PO CAPS: 50000.0000 [IU] | ORAL_CAPSULE | ORAL | 0 refills | Status: AC

## 2024-03-23 MED ORDER — MELOXICAM 15 MG PO TABS: 15.0000 mg | ORAL_TABLET | Freq: Every day | ORAL | 1 refills | Status: DC

## 2024-03-23 NOTE — Progress Notes (Signed)
 Patient ID: Tina [name], female   DOB: 06-25-1991, 33 y.o.   MRN: [id number]   Tina [name], is a 33 y.o. female  ZHY:[id number]  EXB:[id number]  DOB - [birth year]  Chief Complaint  Patient presents with   Leg Pain       Subjective:   Tina [name] is a 33 y.o. female here today for a follow up visit.  She was started on zoloft at last vist.  Doing well. She is sleeping better.  Stressing less.  She feels more motivated and able to accomplish tasks.  She is implementing healthier habits.  Court date set April 2 for her kids to get moved from foster care to her sister and mom then.  She feels hopeful about that too.  The kids are 8 and 3.  The amlodipine is really helping her fingers.  She feels a big difference.  Also she has started taking the weekly vitamin D  Her R knee and lower leg have been bothering her for about 1 week/.  NKI.  Aching pain.  Hurts to bear weight.    No problems updated.  ALLERGIES: No Known Allergies  PAST MEDICAL HISTORY: Past Medical History:  Diagnosis Date   Cyst of right ovary    Dizziness    Generalized headaches    Pain in pelvis    Preterm labor    Syncope    Tonsillitis     MEDICATIONS AT HOME: Prior to Admission medications   Medication Sig Start Date End Date Taking? Authorizing Provider  amLODipine (NORVASC) 2.5 MG tablet Take 1 tablet (2.5 mg total) by mouth daily. 03/23/24  Yes Anders Simmonds, PA-C  amoxicillin-clavulanate (AUGMENTIN) 875-125 MG tablet Take 1 tablet by mouth 2 (two) times daily for 7 days. 03/18/24 03/25/24 Yes Zenia Resides, MD  levonorgestrel (MIRENA, 52 MG,) 20 MCG/DAY IUD    Yes [provider]  meloxicam (MOBIC) 15 MG tablet Take 1 tablet (15 mg total) by mouth daily. With food prn pain 03/23/24  Yes Anders Simmonds, PA-C  sertraline (ZOLOFT) 50 MG tablet Take 1 tablet (50 mg total) by mouth daily. 03/23/24  Yes Anders Simmonds, PA-C  Vitamin D, Ergocalciferol, (DRISDOL) 1.25 MG (50000  UNIT) CAPS capsule Take 1 capsule (50,000 Units total) by mouth every 7 (seven) days. 03/23/24  Yes Lakara Weiland, Marzella Schlein, PA-C    ROS: Neg HEENT Neg resp Neg cardiac Neg GI Neg GU Neg MS Neg psych Neg neuro  Objective:   Vitals:   03/23/24 0832  BP: 105/63  Pulse: 70  Resp: 16  Temp: 98.2 F (36.8 C)  TempSrc: Oral  SpO2: 100%  Weight: 203 lb (92.1 kg)   Exam General appearance : Awake, alert, not in any distress. Speech Clear. Not toxic looking HEENT: Atraumatic and Normocephalic Neck: Supple, no JVD. No cervical lymphadenopathy.  Chest: Good air entry bilaterally, CTAB.  No rales/rhonchi/wheezing CVS: S1 S2 regular, no murmurs.  Extremities: B/L Lower Ext shows no edema, both legs are warm to touch R knee-ligaments stable.  Full S&ROM.  TTP B lateral aspects without swelling or redness Neurology: Awake alert, and oriented X 3, CN II-XII intact, Non focal Skin: No Rash  Data Review Lab Results  Component Value Date   HGBA1C 5.1 02/18/2023   HGBA1C 5.2 11/27/2021    Assessment & Plan   1. Situational depression (Primary) Improved! - sertraline (ZOLOFT) 50 MG tablet; Take 1 tablet (50 mg total) by mouth daily.  Dispense: 30 tablet; Refill: 3  2. Primary Raynaud's phenomenon Improved - amLODipine (NORVASC) 2.5 MG tablet; Take 1 tablet (2.5 mg total) by mouth daily.  Dispense: 90 tablet; Refill: 1  3. Acute pain of right knee No red flags - meloxicam (MOBIC) 15 MG tablet; Take 1 tablet (15 mg total) by mouth daily. With food prn pain  Dispense: 30 tablet; Refill: 1 Referred to ortho  4. Vitamin D deficiency - Vitamin D, Ergocalciferol, (DRISDOL) 1.25 MG (50000 UNIT) CAPS capsule; Take 1 capsule (50,000 Units total) by mouth every 7 (seven) days.  Dispense: 12 capsule; Refill: 0 Recheck in 3 months   Return in about 4 months (around 07/23/2024) for PCP for chronic conditions-[place].  The patient was given clear instructions to go to ER or return to medical  center if symptoms don't improve, worsen or new problems develop. The patient verbalized understanding. The patient was told to call to get lab results if they haven't heard anything in the next week.      Georgian Co, PA-C [facility] and [facility] [place], [place] [phone]   03/23/2024, 8:59 AM

## 2024-03-23 NOTE — Progress Notes (Signed)
 Follow up Right leg discomfort

## 2024-03-29 ENCOUNTER — Ambulatory Visit: Admitting: Physician Assistant

## 2024-03-31 ENCOUNTER — Ambulatory Visit: Admitting: Physician Assistant

## 2024-03-31 ENCOUNTER — Other Ambulatory Visit (INDEPENDENT_AMBULATORY_CARE_PROVIDER_SITE_OTHER): Payer: Self-pay

## 2024-03-31 ENCOUNTER — Encounter: Payer: Self-pay | Admitting: Physician Assistant

## 2024-03-31 DIAGNOSIS — M25561 Pain in right knee: Secondary | ICD-10-CM

## 2024-03-31 NOTE — Progress Notes (Signed)
 Office Visit Note   Patient: Tina [name]           Date of Birth: 03-Jan-1991           MRN: [id number] Visit Date: 03/31/2024              Requested by: Anders Simmonds, PA-C [address] [place],  [place] [address] PCP: Hoy Register, MD   Assessment & Plan: Visit Diagnoses:  1. Acute pain of right knee     Plan: Patient is a pleasant 33 year old woman with a 1 to 2-week history of right knee pain.  She did not have any particular injury but she did start walking.  It was on even ground.  I think most of her symptoms today seem to be extra-articular over the lateral distal insertion of the hamstring.  She does get pain at night which is unusual.  I do not think she has any radiculopathy.  She does get a limp because of the pain and this in this area of her knee especially if she does stairs and walks for a while I will treat her with some physical therapy she will call back in 6 weeks we will see how she is doing  Follow-Up Instructions: Return in about 6 weeks (around 05/12/2024).   Orders:  Orders Placed This Encounter  Procedures   XR KNEE 3 VIEW RIGHT   No orders of the defined types were placed in this encounter.     Procedures: No procedures performed   Clinical Data: No additional findings.   Subjective: Chief Complaint  Patient presents with   Right Knee - Pain    Patient in today with pain in the right knee for 1 week or more.  The pain keeps her up at night. She is taking meloxicam.  If walking or walking up stairs the knee is painful.  She wore a brace for a couple of days as it did not help so she stopped. Ice has not worked she has been trying to relax the knee and stay off of it as much as possible     HPI patient is a pleasant 33 year old woman comes in with a 1 week history of right knee pain.  She said it keeps her up at night.  She has been taking some meloxicam.  Stairs and too much walking causes a limp.  She did wear a brace for a  couple days.  She uses ice does not seem to help  Review of Systems  All other systems reviewed and are negative.    Objective: Vital Signs: There were no vitals taken for this visit.  Physical Exam Constitutional:      Appearance: Normal appearance.  Pulmonary:     Effort: Pulmonary effort is normal.  Skin:    General: Skin is warm and dry.  Neurological:     General: No focal deficit present.     Mental Status: She is alert.  Psychiatric:        Mood and Affect: Mood normal.        Behavior: Behavior normal.     Ortho Exam Emanation of her right knee she has no effusion no erythema compartments are soft and compressible no medial line joint line tenderness some lateral joint line tenderness but more tenderness over the hamstring.  She has good flexion extension she does have recurrence of her pain with extension.  Compartments are soft and nontender negative Homans' sign good varus valgus  and anterior stability Specialty Comments:  No specialty comments available.  Imaging: XR KNEE 3 VIEW RIGHT Result Date: 03/31/2024 X-rays of her right knee some slight joint space narrowing sclerotic changes in the medial compartment no osteophytes no evidence of any previous injury    PMFS History: Patient Active Problem List   Diagnosis Date Noted   Pain in right knee 03/31/2024   Near syncope 04/30/2023   Chronic migraine without aura without status migrainosus, not intractable 02/24/2022   Tinea pedis 02/24/2022   Cervical high risk HPV (human papillomavirus) test positive 02/22/2022   History of premature delivery 02/22/2022   Genital herpes simplex 02/22/2022   Morbid obesity (HCC) 02/22/2022   Vaginal discharge 10/17/2021   VBAC, delivered, 9/11 09/09/2021   Normal labor 09/08/2021   Gestational hypertension 03/04/2020   Normal postpartum course 03/02/2020   Previous cesarean section 09/16/2016   BMI 40.0-44.9, adult (HCC) 09/16/2016   HPV test positive 09/16/2016    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   Past Medical History:  Diagnosis Date   Cyst of right ovary    Dizziness    Generalized headaches    Pain in pelvis    Preterm labor    Syncope    Tonsillitis     Family History  Problem Relation Age of Onset   Diabetes Paternal Aunt    Alcohol abuse Maternal Grandfather    COPD Maternal Grandfather    Diabetes Paternal Grandmother    Healthy Mother    Healthy Father    Heart disease Paternal Uncle        dad's "brothers past away from heart attacks"   Arthritis Neg Hx    Asthma Neg Hx    Birth defects Neg Hx    Depression Neg Hx    Drug abuse Neg Hx    Early death Neg Hx    Hearing loss Neg Hx    Hyperlipidemia Neg Hx    Hypertension Neg Hx    Kidney disease Neg Hx    Learning disabilities Neg Hx    Mental illness Neg Hx    Mental retardation Neg Hx    Miscarriages / Stillbirths Neg Hx    Stroke Neg Hx    Vision loss Neg Hx    Varicose Veins Neg Hx     Past Surgical History:  Procedure Laterality Date   CESAREAN SECTION N/A 07/09/2015   Procedure: CESAREAN SECTION;  Surgeon: Osborn Coho, MD;  Location: [facility];  Service: Obstetrics;  Laterality: N/A;   WISDOM TOOTH EXTRACTION  2015   Social History   Occupational History   Not on file  Tobacco Use   Smoking status: Never   Smokeless tobacco: Never  Vaping Use   Vaping status: Never Used  Substance and Sexual Activity   Alcohol use: No   Drug use: No   Sexual activity: Yes    Birth control/protection: None

## 2024-04-06 DIAGNOSIS — F331 Major depressive disorder, recurrent, moderate: Secondary | ICD-10-CM | POA: Diagnosis not present

## 2024-04-11 DIAGNOSIS — F331 Major depressive disorder, recurrent, moderate: Secondary | ICD-10-CM | POA: Diagnosis not present

## 2024-04-21 DIAGNOSIS — F331 Major depressive disorder, recurrent, moderate: Secondary | ICD-10-CM | POA: Diagnosis not present

## 2024-04-28 DIAGNOSIS — F331 Major depressive disorder, recurrent, moderate: Secondary | ICD-10-CM | POA: Diagnosis not present

## 2024-05-04 DIAGNOSIS — F331 Major depressive disorder, recurrent, moderate: Secondary | ICD-10-CM | POA: Diagnosis not present

## 2024-05-12 ENCOUNTER — Ambulatory Visit: Admitting: Physician Assistant

## 2024-05-15 DIAGNOSIS — F331 Major depressive disorder, recurrent, moderate: Secondary | ICD-10-CM | POA: Diagnosis not present

## 2024-05-16 DIAGNOSIS — F331 Major depressive disorder, recurrent, moderate: Secondary | ICD-10-CM | POA: Diagnosis not present

## 2024-06-02 DIAGNOSIS — F331 Major depressive disorder, recurrent, moderate: Secondary | ICD-10-CM | POA: Diagnosis not present

## 2024-06-10 DIAGNOSIS — F331 Major depressive disorder, recurrent, moderate: Secondary | ICD-10-CM | POA: Diagnosis not present

## 2024-06-18 DIAGNOSIS — F331 Major depressive disorder, recurrent, moderate: Secondary | ICD-10-CM | POA: Diagnosis not present

## 2024-06-22 ENCOUNTER — Telehealth

## 2024-06-22 ENCOUNTER — Telehealth: Admitting: Physician Assistant

## 2024-06-22 DIAGNOSIS — B889 Infestation, unspecified: Secondary | ICD-10-CM | POA: Diagnosis not present

## 2024-06-22 DIAGNOSIS — L0390 Cellulitis, unspecified: Secondary | ICD-10-CM | POA: Diagnosis not present

## 2024-06-22 MED ORDER — PERMETHRIN 5 % EX CREA: 1.0000 | TOPICAL_CREAM | Freq: Once | CUTANEOUS | 0 refills | Status: AC

## 2024-06-22 MED ORDER — HYDROXYZINE PAMOATE 25 MG PO CAPS: 25.0000 mg | ORAL_CAPSULE | Freq: Three times a day (TID) | ORAL | 0 refills | Status: AC | PRN

## 2024-06-22 MED ORDER — CEPHALEXIN 500 MG PO CAPS: 500.0000 mg | ORAL_CAPSULE | Freq: Two times a day (BID) | ORAL | 0 refills | Status: AC

## 2024-06-22 NOTE — Progress Notes (Signed)
 Virtual Visit Consent   Tina [name], you are scheduled for a virtual visit with a [facility] provider today. Just as with appointments in the office, your consent must be obtained to participate. Your consent will be active for this visit and any virtual visit you may have with one of our providers in the next 365 days. If you have a MyChart account, a copy of this consent can be sent to you electronically.  As this is a virtual visit, video technology does not allow for your provider to perform a traditional examination. This may limit your provider's ability to fully assess your condition. If your provider identifies any concerns that need to be evaluated in person or the need to arrange testing (such as labs, EKG, etc.), we will make arrangements to do so. Although advances in technology are sophisticated, we cannot ensure that it will always work on either your end or our end. If the connection with a video visit is poor, the visit may have to be switched to a telephone visit. With either a video or telephone visit, we are not always able to ensure that we have a secure connection.  By engaging in this virtual visit, you consent to the provision of healthcare and authorize for your insurance to be billed (if applicable) for the services provided during this visit. Depending on your insurance coverage, you may receive a charge related to this service.  I need to obtain your verbal consent now. Are you willing to proceed with your visit today? Tina [name] has provided verbal consent on 06/22/2024 for a virtual visit (video or telephone). Elsie Velma Lunger, [place]  Date: 06/22/2024 12:17 PM   Virtual Visit via Video Note   I, Elsie Velma Lunger, connected with  Tina [name]  ([id number], [birth year]) on 06/22/24 at 12:15 PM EDT by a video-enabled telemedicine application and verified that I am speaking with the correct person using two identifiers.  Location: Patient: Virtual Visit Location  Patient: Home Provider: Virtual Visit Location Provider: Home Office   I discussed the limitations of evaluation and management by telemedicine and the availability of in person appointments. The patient expressed understanding and agreed to proceed.    History of Present Illness: Tina [name] is a 33 y.o. who identifies as a female who was assigned female at birth, and is being seen today for possible insect bites to lower extremities, first noted 5 days ago. Notes area was red and itchy at first but now becoming more painful/tender. New areas noted on her upper extremities and torso. Denies fever, chills Denies recent travel or known sick contact.  No pets at home.    HPI: HPI  Problems:  Patient Active Problem List   Diagnosis Date Noted   Pain in right knee 03/31/2024   Near syncope 04/30/2023   Chronic migraine without aura without status migrainosus, not intractable 02/24/2022   Tinea pedis 02/24/2022   Cervical high risk HPV (human papillomavirus) test positive 02/22/2022   History of premature delivery 02/22/2022   Genital herpes simplex 02/22/2022   Morbid obesity (HCC) 02/22/2022   Vaginal discharge 10/17/2021   VBAC, delivered, 9/11 09/09/2021   Normal labor 09/08/2021   Gestational hypertension 03/04/2020   Normal postpartum course 03/02/2020   Previous cesarean section 09/16/2016   BMI 40.0-44.9, adult (HCC) 09/16/2016   HPV test positive 09/16/2016   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    Allergies: No Known Allergies Medications:  Current Outpatient Medications:    cephALEXin (KEFLEX) 500 MG capsule, Take  1 capsule (500 mg total) by mouth 2 (two) times daily for 7 days., Disp: 14 capsule, Rfl: 0   hydrOXYzine (VISTARIL) 25 MG capsule, Take 1 capsule (25 mg total) by mouth every 8 (eight) hours as needed., Disp: 30 capsule, Rfl: 0   permethrin (ELIMITE) 5 % cream, Apply 1 Application topically once for 1 dose., Disp: 60 g, Rfl: 0   amLODipine  (NORVASC ) 2.5 MG tablet,  Take 1 tablet (2.5 mg total) by mouth daily., Disp: 90 tablet, Rfl: 1   levonorgestrel (MIRENA, 52 MG,) 20 MCG/DAY IUD, , Disp: , Rfl:    meloxicam  (MOBIC ) 15 MG tablet, Take 1 tablet (15 mg total) by mouth daily. With food prn pain, Disp: 30 tablet, Rfl: 1   sertraline  (ZOLOFT ) 50 MG tablet, Take 1 tablet (50 mg total) by mouth daily., Disp: 30 tablet, Rfl: 3   Vitamin D , Ergocalciferol , (DRISDOL ) 1.25 MG (50000 UNIT) CAPS capsule, Take 1 capsule (50,000 Units total) by mouth every 7 (seven) days., Disp: 12 capsule, Rfl: 0  Observations/Objective: Patient is well-developed, well-nourished in no acute distress.  Resting comfortably at home.  Head is normocephalic, atraumatic.  No labored breathing.  Speech is clear and coherent with logical content.  Patient is alert and oriented at baseline.  Scattered areas of mild erythema noted, worse on her lower extremities, waistline and antecubital areas of upper extremities.  Assessment and Plan: 1. Mite infestation (Primary) - hydrOXYzine (VISTARIL) 25 MG capsule; Take 1 capsule (25 mg total) by mouth every 8 (eight) hours as needed.  Dispense: 30 capsule; Refill: 0 - permethrin (ELIMITE) 5 % cream; Apply 1 Application topically once for 1 dose.  Dispense: 60 g; Refill: 0  2. Cellulitis of skin - cephALEXin (KEFLEX) 500 MG capsule; Take 1 capsule (500 mg total) by mouth 2 (two) times daily for 7 days.  Dispense: 14 capsule; Refill: 0  Start Permethrin cream x 1 to take care of anything still present on the skin. She is to [employer], store in airtight container for 7 days, rewash before using again. Hydroxyzine for itch and inflammation. Keflex per orders. In person follow-up for any non-resolving, new or worsening symptoms despite treatment.  Follow Up Instructions: I discussed the assessment and treatment plan with the patient. The patient was provided an opportunity to ask questions and all were answered. The patient agreed with the plan and  demonstrated an understanding of the instructions.  A copy of instructions were sent to the patient via MyChart unless otherwise noted below.   The patient was advised to call back or seek an in-person evaluation if the symptoms worsen or if the condition fails to improve as anticipated.    Elsie Velma Lunger, PA-C

## 2024-06-22 NOTE — Patient Instructions (Signed)
  Lela Jacobson, thank you for joining Elsie Velma Lunger, PA-C for today's virtual visit.  While this provider is not your primary care provider (PCP), if your PCP is located in our provider database this encounter information will be shared with them immediately following your visit.   A [facility] MyChart account gives you access to today's visit and all your visits, tests, and labs performed at [facility]  click here if you don't have a [facility] MyChart account or go to [email or web address]  Consent: (Patient) Tina [name] provided verbal consent for this virtual visit at the beginning of the encounter.  Current Medications:  Current Outpatient Medications:    amLODipine  (NORVASC ) 2.5 MG tablet, Take 1 tablet (2.5 mg total) by mouth daily., Disp: 90 tablet, Rfl: 1   levonorgestrel (MIRENA, 52 MG,) 20 MCG/DAY IUD, , Disp: , Rfl:    meloxicam  (MOBIC ) 15 MG tablet, Take 1 tablet (15 mg total) by mouth daily. With food prn pain, Disp: 30 tablet, Rfl: 1   sertraline  (ZOLOFT ) 50 MG tablet, Take 1 tablet (50 mg total) by mouth daily., Disp: 30 tablet, Rfl: 3   Vitamin D , Ergocalciferol , (DRISDOL ) 1.25 MG (50000 UNIT) CAPS capsule, Take 1 capsule (50,000 Units total) by mouth every 7 (seven) days., Disp: 12 capsule, Rfl: 0   Medications ordered in this encounter:  No orders of the defined types were placed in this encounter.    *If you need refills on other medications prior to your next appointment, please contact your pharmacy*  Follow-Up: Call back or seek an in-person evaluation if the symptoms worsen or if the condition fails to improve as anticipated.  [facility] [phone]  Other Instructions Please keep the skin clean and dry. Apply the permethrin cream at nighttime as discussed, let it completely dry.  Wash off first thing in the morning. Please wash all bed linens in hot water, storing them in an airtight container for 7 days.  Rewash  before use. The hydroxyzine will help calm down itch and inflammation. Take the antibiotic as directed. If you note any non-resolving, new, or worsening symptoms despite treatment, please seek an in-person evaluation ASAP.    If you have been instructed to have an in-person evaluation today at a local Urgent Care facility, please use the link below. It will take you to a list of all of our available [facility], including address, phone number and hours of operation. Please do not delay care.  [facility]  If you or a family member do not have a primary care provider, use the link below to schedule a visit and establish care. When you choose a [facility] primary care physician or advanced practice provider, you gain a long-term partner in health. Find a Primary Care Provider  Learn more about 's in-office and virtual care options:  - Get Care Now

## 2024-06-24 DIAGNOSIS — F331 Major depressive disorder, recurrent, moderate: Secondary | ICD-10-CM | POA: Diagnosis not present

## 2024-07-04 DIAGNOSIS — F331 Major depressive disorder, recurrent, moderate: Secondary | ICD-10-CM | POA: Diagnosis not present

## 2024-07-22 DIAGNOSIS — F331 Major depressive disorder, recurrent, moderate: Secondary | ICD-10-CM | POA: Diagnosis not present

## 2024-07-26 ENCOUNTER — Ambulatory Visit: Payer: Medicaid Other | Admitting: Neurology

## 2024-07-26 ENCOUNTER — Ambulatory Visit: Admitting: Family Medicine

## 2024-08-02 DIAGNOSIS — F331 Major depressive disorder, recurrent, moderate: Secondary | ICD-10-CM | POA: Diagnosis not present
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
## 2024-08-10 ENCOUNTER — Encounter: Payer: Self-pay | Admitting: Neurology

## 2024-08-10 ENCOUNTER — Ambulatory Visit: Admitting: Neurology

## 2024-08-10 VITALS — BP 119/73 | HR 59 | Ht 62.0 in | Wt 194.0 lb

## 2024-08-10 DIAGNOSIS — G43009 Migraine without aura, not intractable, without status migrainosus: Secondary | ICD-10-CM | POA: Diagnosis not present

## 2024-08-10 DIAGNOSIS — G44219 Episodic tension-type headache, not intractable: Secondary | ICD-10-CM | POA: Diagnosis not present

## 2024-08-10 DIAGNOSIS — R42 Dizziness and giddiness: Secondary | ICD-10-CM | POA: Diagnosis not present

## 2024-08-10 MED ORDER — RIZATRIPTAN BENZOATE 10 MG PO TABS: 10.0000 mg | ORAL_TABLET | ORAL | 5 refills | Status: AC | PRN

## 2024-08-10 MED ORDER — ONDANSETRON HCL 4 MG PO TABS: 4.0000 mg | ORAL_TABLET | Freq: Three times a day (TID) | ORAL | 5 refills | Status: AC | PRN

## 2024-08-10 MED ORDER — NORTRIPTYLINE HCL 25 MG PO CAPS
ORAL_CAPSULE | ORAL | 0 refills | Status: DC
Start: 2024-08-10 — End: 2024-09-12

## 2024-08-10 NOTE — Patient Instructions (Signed)
 Start nortriptyline  25mg  capsule - take 1 capsule at bedtime for 2 weeks, then increase to 2 capsules at bedtime.  Contact us  for refill and I will send in a 50mg  capsule to take at bedtime.  If no improvement after another 4 weeks, contact me and we can increase dose. Take rizatriptan  as directed Stop sertraline  Limit use of pain relievers to no more than 9 days out of the month to prevent risk of rebound or medication-overuse headache. Keep headache diary Follow up 8 months.

## 2024-08-22 ENCOUNTER — Ambulatory Visit: Admitting: Nurse Practitioner

## 2024-09-09 DIAGNOSIS — F331 Major depressive disorder, recurrent, moderate: Secondary | ICD-10-CM | POA: Diagnosis not present

## 2024-09-11 ENCOUNTER — Other Ambulatory Visit: Payer: Self-pay | Admitting: Neurology

## 2024-09-16 DIAGNOSIS — F331 Major depressive disorder, recurrent, moderate: Secondary | ICD-10-CM | POA: Diagnosis not present

## 2024-09-20 ENCOUNTER — Ambulatory Visit: Payer: Self-pay | Attending: Family Medicine | Admitting: Family Medicine

## 2024-09-20 ENCOUNTER — Encounter: Payer: Self-pay | Admitting: Family Medicine

## 2024-09-20 VITALS — BP 109/70 | HR 73 | Ht 62.0 in | Wt 194.6 lb

## 2024-09-20 DIAGNOSIS — E559 Vitamin D deficiency, unspecified: Secondary | ICD-10-CM | POA: Diagnosis not present

## 2024-09-20 DIAGNOSIS — Z975 Presence of (intrauterine) contraceptive device: Secondary | ICD-10-CM | POA: Diagnosis not present

## 2024-09-20 DIAGNOSIS — Z13228 Encounter for screening for other metabolic disorders: Secondary | ICD-10-CM | POA: Diagnosis not present

## 2024-09-20 DIAGNOSIS — Z131 Encounter for screening for diabetes mellitus: Secondary | ICD-10-CM | POA: Diagnosis not present

## 2024-09-20 LAB — POCT URINE PREGNANCY: Preg Test, Ur: NEGATIVE

## 2024-09-20 NOTE — Patient Instructions (Signed)
 VISIT SUMMARY:  During your visit, we discussed your concerns about your IUD placement and associated symptoms, as well as your anxiety and vitamin D  deficiency. We also reviewed your general health maintenance needs.  YOUR PLAN:  -SUSPECTED MALPOSITIONED INTRAUTERINE DEVICE (IUD) WITH INTERCOURSE-ASSOCIATED VAGINAL BLEEDING: A malpositioned IUD means that the device may not be in the correct position in your uterus, which can cause symptoms like bleeding and discomfort during intercourse. We will order a pelvic ultrasound to determine the IUD's location. In the meantime, please use backup contraception until we confirm the IUD's position.  -VITAMIN D  DEFICIENCY: Vitamin D  deficiency means that your body has lower than normal levels of vitamin D , which is important for bone health and overall well-being. We will check your vitamin D  levels to see if your current supplementation is adequate. If your levels are normal, continue taking 1000 units of vitamin D  daily. If your levels are low, we may need to increase your dose to 50,000 units weekly.  -GENERAL HEALTH MAINTENANCE: It's important to regularly check your overall health through routine blood work. We will assess your kidney and liver function, blood count, diabetes risk, and cholesterol levels. Please schedule a separate fasting day for the cholesterol test.  INSTRUCTIONS:  Please schedule a pelvic ultrasound to check the position of your IUD. Use backup contraception until we confirm the IUD's location. Additionally, schedule blood work to assess your kidney and liver function, blood count, and diabetes risk. Remember to schedule a separate fasting day for your cholesterol test.

## 2024-09-20 NOTE — Progress Notes (Signed)
 Subjective:  Patient ID: Tina [name], female    DOB: 1991/11/08  Age: 33 y.o. MRN: [id number]  CC: Medical Management of Chronic Issues (Check IUD/)     Discussed the use of AI scribe software for clinical note transcription with the patient, who gave verbal consent to proceed.  History of Present Illness Tina [name] is a 33 year old female with a history of migraines who presents with concerns about her IUD placement and associated symptoms.  She experiences bleeding during sexual intercourse for the past one to two months and is unable to feel or see the IUD string. The IUD was placed at [facility] in 2022, a few weeks after her six-month postpartum checkup. There is no intermenstrual spotting or bleeding outside of intercourse, but she experiences discomfort during intercourse. There is no low abdominal cramping.   She is not currently taking amlodipine , which was prescribed during her pregnancy for blood pressure management. Her current medications include vitamin D  capsules. She has not had blood work done in over a year.  She has four children and reports no periods since her last childbirth. Her first child was delivered via C-section, and subsequent children were delivered vaginally.    Past Medical History:  Diagnosis Date   Cyst of right ovary    Dizziness    Generalized headaches    Pain in pelvis    Preterm labor    Syncope    Tonsillitis     Past Surgical History:  Procedure Laterality Date   CESAREAN SECTION N/A 07/09/2015   Procedure: CESAREAN SECTION;  Surgeon: Jon Rummer, MD;  Location: [facility];  Service: Obstetrics;  Laterality: N/A;   WISDOM TOOTH EXTRACTION  2015    Family History  Problem Relation Age of Onset   Diabetes Paternal Aunt    Alcohol abuse Maternal Grandfather    COPD Maternal Grandfather    Diabetes Paternal Grandmother    Healthy Mother    Healthy Father    Heart disease Paternal Uncle        dad's brothers past away  from heart attacks   Arthritis Neg Hx    Asthma Neg Hx    Birth defects Neg Hx    Depression Neg Hx    Drug abuse Neg Hx    Early death Neg Hx    Hearing loss Neg Hx    Hyperlipidemia Neg Hx    Hypertension Neg Hx    Kidney disease Neg Hx    Learning disabilities Neg Hx    Mental illness Neg Hx    Mental retardation Neg Hx    Miscarriages / Stillbirths Neg Hx    Stroke Neg Hx    Vision loss Neg Hx    Varicose Veins Neg Hx     Social History   Socioeconomic History   Marital status: Single    Spouse name: Not on file   Number of children: Not on file   Years of education: Not on file   Highest education level: Some college, no degree  Occupational History   Not on file  Tobacco Use   Smoking status: Never   Smokeless tobacco: Never  Vaping Use   Vaping status: Never Used  Substance and Sexual Activity   Alcohol use: No   Drug use: No   Sexual activity: Yes    Birth control/protection: None  Other Topics Concern   Not on file  Social History Narrative   Not on file   Social Drivers of  Health   Financial Resource Strain: Low Risk  (03/23/2024)   Overall Financial Resource Strain (CARDIA)    Difficulty of Paying Living Expenses: Not hard at all  Food Insecurity: No Food Insecurity (03/23/2024)   Hunger Vital Sign    Worried About Running Out of Food in the Last Year: Never true    Ran Out of Food in the Last Year: Never true  Transportation Needs: No Transportation Needs (03/23/2024)   PRAPARE - Administrator, Civil Service (Medical): No    Lack of Transportation (Non-Medical): No  Physical Activity: Insufficiently Active (03/23/2024)   Exercise Vital Sign    Days of Exercise per Week: 1 day    Minutes of Exercise per Session: 30 min  Stress: No Stress Concern Present (03/23/2024)   [employer] of Occupational Health - Occupational Stress Questionnaire    Feeling of Stress : Not at all  Recent Concern: Stress - Stress Concern Present  (02/24/2024)   [employer] of Occupational Health - Occupational Stress Questionnaire    Feeling of Stress : To some extent  Social Connections: Socially Isolated (03/23/2024)   Social Connection and Isolation Panel    Frequency of Communication with Friends and Family: Three times a week    Frequency of Social Gatherings with Friends and Family: Once a week    Attends Religious Services: Never    Database administrator or Organizations: No    Attends Engineer, structural: Never    Marital Status: Never married    No Known Allergies  Outpatient Medications Prior to Visit  Medication Sig Dispense Refill   hydrOXYzine  (VISTARIL ) 25 MG capsule Take 1 capsule (25 mg total) by mouth every 8 (eight) hours as needed. 30 capsule 0   levonorgestrel (MIRENA, 52 MG,) 20 MCG/DAY IUD      nortriptyline  (PAMELOR ) 25 MG capsule TAKE 1 CAPSULE AT BEDTIME FOR 14 DAYS, THEN INCREASE TO 2 CAPSULES AT BEDTIME. 180 capsule 1   ondansetron  (ZOFRAN ) 4 MG tablet Take 1 tablet (4 mg total) by mouth every 8 (eight) hours as needed for nausea or vomiting. 20 tablet 5   rizatriptan  (MAXALT ) 10 MG tablet Take 1 tablet (10 mg total) by mouth as needed for migraine. May repeat in 2 hours if needed.  Maximum 2 tablets in 24 hours. 10 tablet 5   Vitamin D , Ergocalciferol , (DRISDOL ) 1.25 MG (50000 UNIT) CAPS capsule Take 1 capsule (50,000 Units total) by mouth every 7 (seven) days. 12 capsule 0   amLODipine  (NORVASC ) 2.5 MG tablet Take 1 tablet (2.5 mg total) by mouth daily. 90 tablet 1   No facility-administered medications prior to visit.     ROS Review of Systems  Constitutional:  Negative for activity change and appetite change.  HENT:  Negative for sinus pressure and sore throat.   Respiratory:  Negative for chest tightness, shortness of breath and wheezing.   Cardiovascular:  Negative for chest pain and palpitations.  Gastrointestinal:  Negative for abdominal distention, abdominal pain and  constipation.  Genitourinary: Negative.   Musculoskeletal: Negative.   Psychiatric/Behavioral:  Negative for behavioral problems and dysphoric mood.     Objective:  BP 109/70   Pulse 73   Ht 5' 2 (1.575 m)   Wt 194 lb 9.6 oz (88.3 kg)   SpO2 100%   BMI 35.59 kg/m      09/20/2024    8:53 AM 08/10/2024    9:36 AM 03/23/2024    8:32 AM  BP/Weight  Systolic BP 109 119 105  Diastolic BP 70 73 63  Wt. (Lbs) 194.6 194 203  BMI 35.59 kg/m2 35.48 kg/m2 37.13 kg/m2      Physical Exam Constitutional:      Appearance: She is well-developed.  Cardiovascular:     Rate and Rhythm: Normal rate.     Heart sounds: Normal heart sounds. No murmur heard. Pulmonary:     Effort: Pulmonary effort is normal.     Breath sounds: Normal breath sounds. No wheezing or rales.  Chest:     Chest wall: No tenderness.  Abdominal:     General: Bowel sounds are normal. There is no distension.     Palpations: Abdomen is soft. There is no mass.     Tenderness: There is no abdominal tenderness.  Genitourinary:    Comments: IUD string not visible Musculoskeletal:        General: Normal range of motion.     Right lower leg: No edema.     Left lower leg: No edema.  Neurological:     Mental Status: She is alert and oriented to person, place, and time.  Psychiatric:        Mood and Affect: Mood normal.        Latest Ref Rng & Units 02/18/2023    4:36 PM 04/19/2021    5:53 PM 03/02/2020    4:50 PM  CMP  Glucose 70 - 99 mg/dL 80  93  89   BUN 6 - 20 mg/dL 12  7  7    Creatinine 0.57 - 1.00 mg/dL 9.31  9.33  9.21   Sodium 134 - 144 mmol/L 141  139  137   Potassium 3.5 - 5.2 mmol/L 4.1  3.7  3.5   Chloride 96 - 106 mmol/L 103  108  109   CO2 20 - 29 mmol/L 23  26  21    Calcium  8.7 - 10.2 mg/dL 9.8  9.0  9.2   Total Protein 6.0 - 8.5 g/dL 7.3  6.5  5.5   Total Bilirubin 0.0 - 1.2 mg/dL 0.6  0.3  0.5   Alkaline Phos 44 - 121 IU/L 62  50  125   AST 0 - 40 IU/L 13  11  20    ALT 0 - 32 IU/L 16  10  17       Lipid Panel  No results found for: CHOL, TRIG, HDL, CHOLHDL, VLDL, LDLCALC, LDLDIRECT  CBC    Component Value Date/Time   WBC 7.8 11/09/2023 1114   WBC 11.0 (H) 09/09/2021 0435   RBC 4.45 11/09/2023 1114   RBC 3.65 (L) 09/09/2021 0435   HGB 13.9 11/09/2023 1114   HCT 43.4 11/09/2023 1114   PLT 222 11/09/2023 1114   MCV 98 (H) 11/09/2023 1114   MCH 31.2 11/09/2023 1114   MCH 32.1 09/09/2021 0435   MCHC 32.0 11/09/2023 1114   MCHC 33.8 09/09/2021 0435   RDW 11.9 11/09/2023 1114   LYMPHSABS 3.4 (H) 11/09/2023 1114   MONOABS 0.7 04/19/2021 1753   EOSABS 0.0 11/09/2023 1114   BASOSABS 0.0 11/09/2023 1114    Lab Results  Component Value Date   HGBA1C 5.1 02/18/2023       Assessment & Plan IUD in place Suspected malpositioned intrauterine device (IUD) with intercourse-associated vaginal bleeding Vaginal bleeding and discomfort during intercourse suggest possible malpositioned IUD. IUD string not palpable or visible. Negative pregnancy test. - Order pelvic ultrasound to determine IUD location. - Advise using backup contraception until IUD  location confirmed. - Urine pregnancy test negative  Vitamin D  deficiency Currently on vitamin D  supplements. Previous levels low, repeat level needed to adjust supplementation. - Order vitamin D  level to assess current status. - If normal, continue 1000 units vitamin D  daily. - If low, consider 50,000 units vitamin D  weekly.  General Health Maintenance No blood work in over a year. Needs assessment of kidney and liver function, blood count, diabetes screening, and cholesterol levels. - Order blood work for kidney and liver function, blood count, and A1c for diabetes screening. - Order cholesterol test on separate fasting day.     Healthcare maintenance Screening for metabolic disorder-comprehensive metabolic panel ordered Screening for diabetes-A1c ordered  No orders of the defined types were placed in this  encounter.   Follow-up: Return in about 6 months (around 03/20/2025) for Chronic medical conditions.       Corrina Sabin, MD, FAAFP. [facility] [place], [place] [phone]   09/20/2024, 12:47 PM

## 2024-09-21 ENCOUNTER — Ambulatory Visit: Payer: Self-pay | Admitting: Family Medicine

## 2024-09-21 LAB — CMP14+EGFR
ALT: 14 IU/L (ref 0–32)
AST: 10 IU/L (ref 0–40)
Albumin: 4.3 g/dL (ref 3.9–4.9)
Alkaline Phosphatase: 68 IU/L (ref 41–116)
BUN/Creatinine Ratio: 22 (ref 9–23)
BUN: 14 mg/dL (ref 6–20)
Bilirubin Total: 0.3 mg/dL (ref 0.0–1.2)
CO2: 22 mmol/L (ref 20–29)
Calcium: 9.6 mg/dL (ref 8.7–10.2)
Chloride: 103 mmol/L (ref 96–106)
Creatinine, Ser: 0.65 mg/dL (ref 0.57–1.00)
Globulin, Total: 2.8 g/dL (ref 1.5–4.5)
Glucose: 95 mg/dL (ref 70–99)
Potassium: 4.9 mmol/L (ref 3.5–5.2)
Sodium: 139 mmol/L (ref 134–144)
Total Protein: 7.1 g/dL (ref 6.0–8.5)
eGFR: 118 mL/min/1.73 (ref >59)

## 2024-09-21 LAB — HEMOGLOBIN A1C
Est. average glucose Bld gHb Est-mCnc: 91 mg/dL
Hgb A1c MFr Bld: 4.8 % (ref 4.8–5.6)

## 2024-09-21 LAB — VITAMIN D 25 HYDROXY (VIT D DEFICIENCY, FRACTURES): Vit D, 25-Hydroxy: 33.2 ng/mL (ref 30.0–100.0)

## 2024-09-22 ENCOUNTER — Ambulatory Visit
Admission: RE | Admit: 2024-09-22 | Discharge: 2024-09-22 | Disposition: A | Discharge status: Home / Self Care | Source: Ambulatory Visit | Attending: Family Medicine | Admitting: Family Medicine

## 2024-09-22 DIAGNOSIS — Z975 Presence of (intrauterine) contraceptive device: Secondary | ICD-10-CM

## 2024-10-14 DIAGNOSIS — F331 Major depressive disorder, recurrent, moderate: Secondary | ICD-10-CM | POA: Diagnosis not present

## 2024-10-18 DIAGNOSIS — F331 Major depressive disorder, recurrent, moderate: Secondary | ICD-10-CM | POA: Diagnosis not present

## 2024-10-31 ENCOUNTER — Encounter: Payer: Self-pay | Admitting: Radiology

## 2024-11-09 DIAGNOSIS — F331 Major depressive disorder, recurrent, moderate: Secondary | ICD-10-CM | POA: Diagnosis not present

## 2025-01-25 ENCOUNTER — Telehealth: Payer: Self-pay

## 2025-01-25 DIAGNOSIS — G43709 Chronic migraine without aura, not intractable, without status migrainosus: Secondary | ICD-10-CM

## 2025-01-25 NOTE — Progress Notes (Signed)
" °  Complex Care Management Note  Care Guide Note 01/25/2025 Name: Tina [name] MRN: [id number] DOB: [birth year]  Tina [name] is a 34 y.o. year old female who sees Delbert Clam, MD for primary care. I reached out to [employer] by phone today to offer complex care management services.  Tina [name] was given information about Complex Care Management services today including:   The Complex Care Management services include support from the care team which includes your Nurse Care Manager, Clinical Social Worker, or Pharmacist.  The Complex Care Management team is here to help remove barriers to the health concerns and goals most important to you. Complex Care Management services are voluntary, and the patient may decline or stop services at any time by request to their care team member.   Complex Care Management Consent Status: Patient agreed to services and verbal consent obtained.   Follow up plan:  Telephone appointment with complex care management team member scheduled for:  02/27/2025  Encounter Outcome:  Patient Scheduled "

## 2025-02-27 ENCOUNTER — Telehealth: Admitting: *Deleted

## 2025-03-20 ENCOUNTER — Ambulatory Visit: Admitting: Family Medicine

## 2025-04-11 ENCOUNTER — Ambulatory Visit: Admitting: Neurology
